# Patient Record
Sex: Male | Born: 1975 | Race: White | Hispanic: No | Marital: Married | Smoking: Never smoker
Health system: Southern US, Community
[De-identification: ages and names within clinical notes are randomized; demographics above are authoritative.]

## PROBLEM LIST (undated history)

## (undated) DIAGNOSIS — N200 Calculus of kidney: Secondary | ICD-10-CM

## (undated) DIAGNOSIS — E230 Hypopituitarism: Secondary | ICD-10-CM

## (undated) DIAGNOSIS — U071 COVID-19: Secondary | ICD-10-CM

## (undated) DIAGNOSIS — E213 Hyperparathyroidism, unspecified: Secondary | ICD-10-CM

## (undated) DIAGNOSIS — J1282 Pneumonia due to coronavirus disease 2019: Secondary | ICD-10-CM

## (undated) DIAGNOSIS — I3139 Other pericardial effusion (noninflammatory): Secondary | ICD-10-CM

## (undated) DIAGNOSIS — E871 Hypo-osmolality and hyponatremia: Secondary | ICD-10-CM

## (undated) DIAGNOSIS — E039 Hypothyroidism, unspecified: Secondary | ICD-10-CM

---

## 2001-09-12 ENCOUNTER — Encounter: Payer: Self-pay | Admitting: Emergency Medicine

## 2001-09-12 ENCOUNTER — Emergency Department (HOSPITAL_COMMUNITY): Admission: EM | Admit: 2001-09-12 | Discharge: 2001-09-12 | Payer: Self-pay | Admitting: Emergency Medicine

## 2004-05-18 ENCOUNTER — Emergency Department (HOSPITAL_COMMUNITY): Admission: EM | Admit: 2004-05-18 | Discharge: 2004-05-18 | Payer: Self-pay | Admitting: *Deleted

## 2004-06-04 ENCOUNTER — Emergency Department (HOSPITAL_COMMUNITY): Admission: EM | Admit: 2004-06-04 | Discharge: 2004-06-05 | Payer: Self-pay | Admitting: Emergency Medicine

## 2005-07-05 ENCOUNTER — Emergency Department (HOSPITAL_COMMUNITY): Admission: EM | Admit: 2005-07-05 | Discharge: 2005-07-06 | Payer: Self-pay | Admitting: Emergency Medicine

## 2005-11-08 ENCOUNTER — Emergency Department (HOSPITAL_COMMUNITY): Admission: EM | Admit: 2005-11-08 | Discharge: 2005-11-08 | Payer: Self-pay | Admitting: Emergency Medicine

## 2005-12-10 ENCOUNTER — Emergency Department (HOSPITAL_COMMUNITY): Admission: EM | Admit: 2005-12-10 | Discharge: 2005-12-11 | Payer: Self-pay | Admitting: Emergency Medicine

## 2007-06-20 ENCOUNTER — Emergency Department (HOSPITAL_COMMUNITY): Admission: EM | Admit: 2007-06-20 | Discharge: 2007-06-20 | Payer: Self-pay | Admitting: Emergency Medicine

## 2007-06-23 ENCOUNTER — Emergency Department (HOSPITAL_COMMUNITY): Admission: EM | Admit: 2007-06-23 | Discharge: 2007-06-24 | Payer: Self-pay | Admitting: Emergency Medicine

## 2007-12-30 ENCOUNTER — Emergency Department (HOSPITAL_COMMUNITY): Admission: EM | Admit: 2007-12-30 | Discharge: 2007-12-30 | Payer: Self-pay | Admitting: Emergency Medicine

## 2008-01-12 ENCOUNTER — Emergency Department (HOSPITAL_COMMUNITY): Admission: EM | Admit: 2008-01-12 | Discharge: 2008-01-13 | Payer: Self-pay | Admitting: Emergency Medicine

## 2009-10-04 IMAGING — CT CT PELVIS W/O CM
1 of 2 series · 13 of 32 positions shown, 19 images · non-contrast
Comparison: 06/20/2007

CT ABDOMEN

CLINICAL DATA: Right lower side pain.  Vomiting.  Flank pain.

CT OF THE ABDOMEN AND PELVIS WITHOUT CONTRAST (CT UROGRAM)
TECHNIQUE: Multidetector CT imaging was performed through the
abdomen and pelvis to include the urinary tract.

[Series 2: 130 stone 5.0 b40f st · axial · 0.75mm/px · z∈[-602,-188]mm · 13 of 97 slices shown, 19 images]
[im 7/97  soft-tissue]
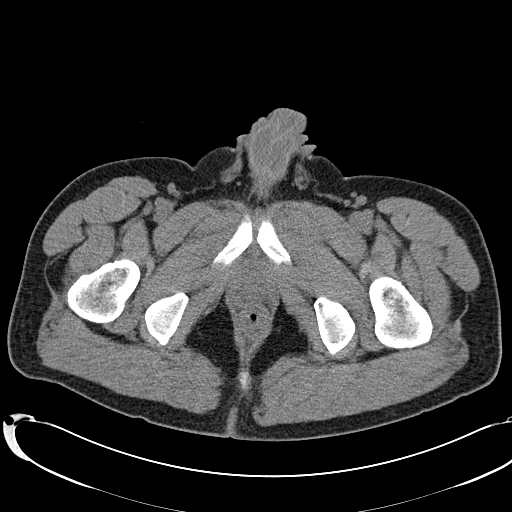
[im 7/97  bone]
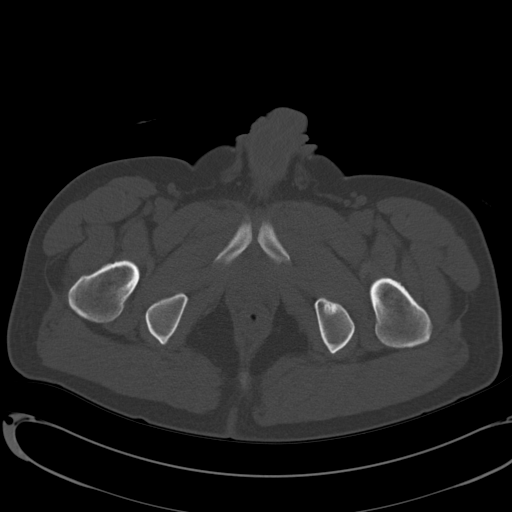
[im 13/97  soft-tissue]
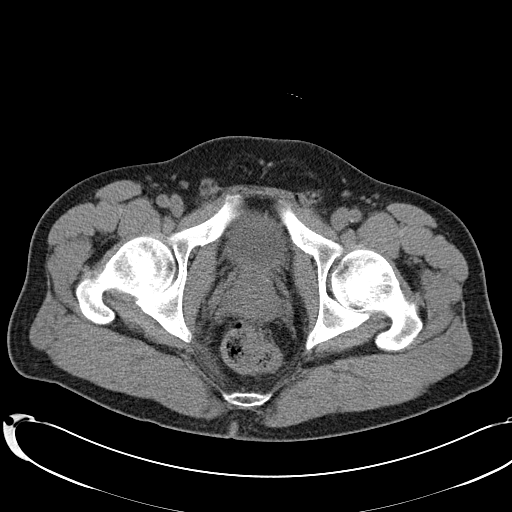
[im 20/97  soft-tissue]
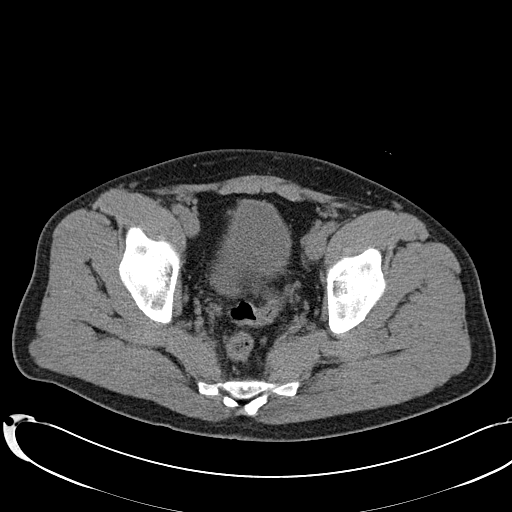
[im 26/97  soft-tissue]
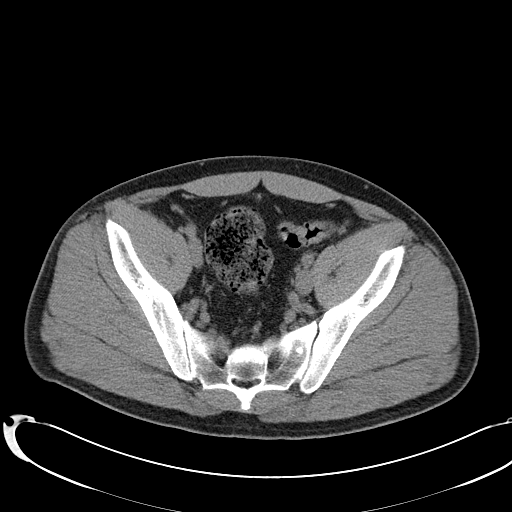
[im 33/97  soft-tissue]
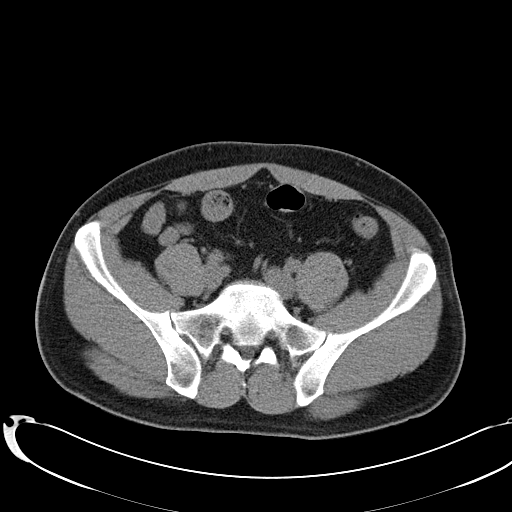
[im 39/97  soft-tissue]
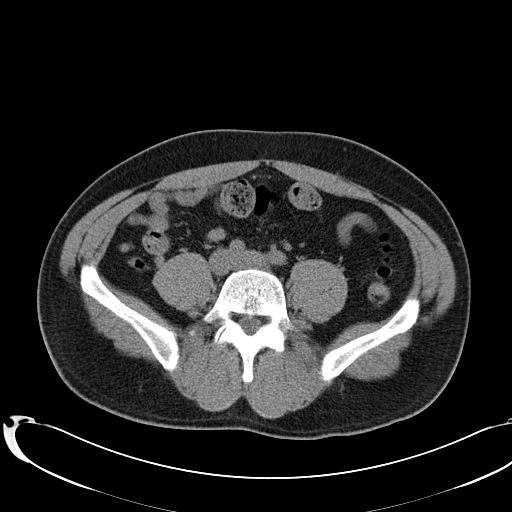
[im 52/97  soft-tissue]
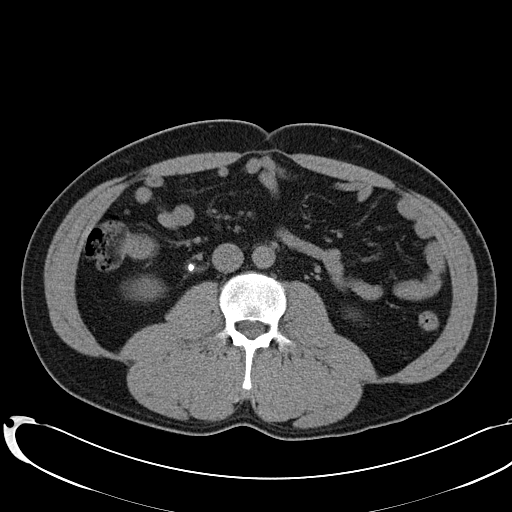
[im 58/97  soft-tissue]
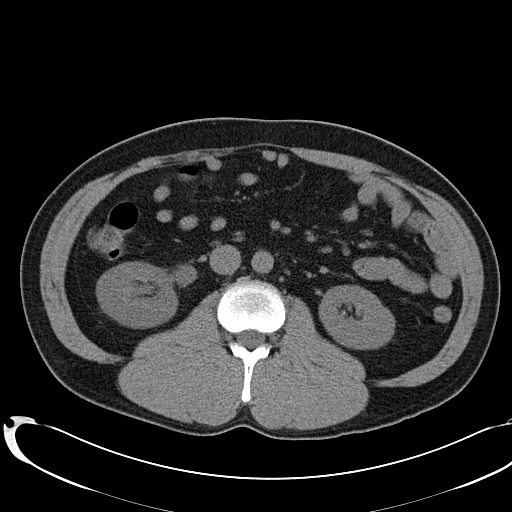
[im 65/97  soft-tissue]
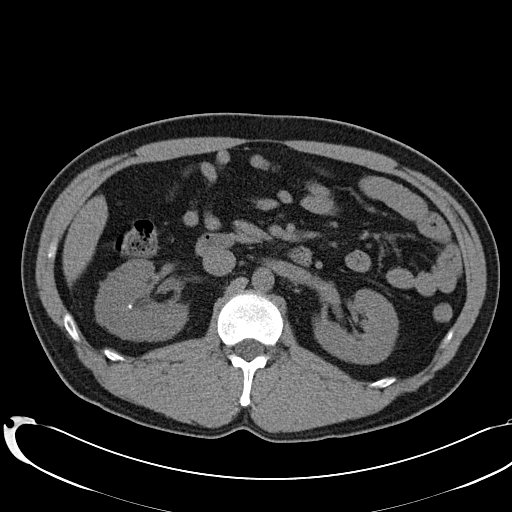
[im 65/97  bone]
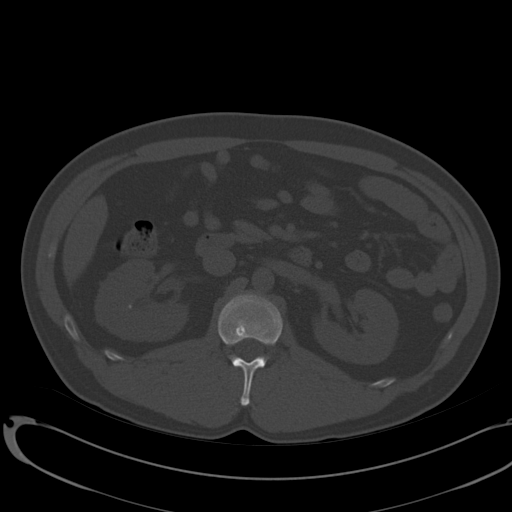
[im 71/97  soft-tissue]
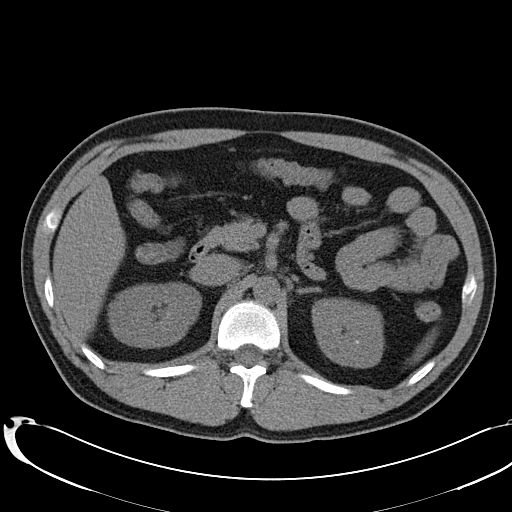
[im 71/97  lung]
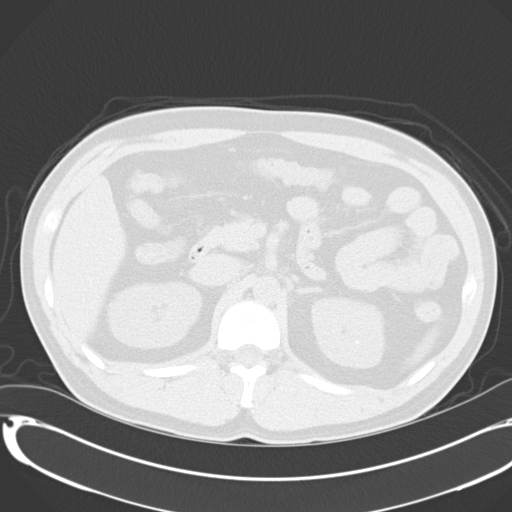
[im 77/97  soft-tissue]
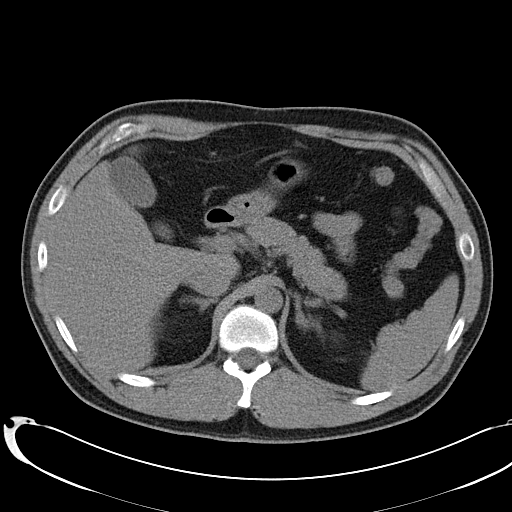
[im 77/97  lung]
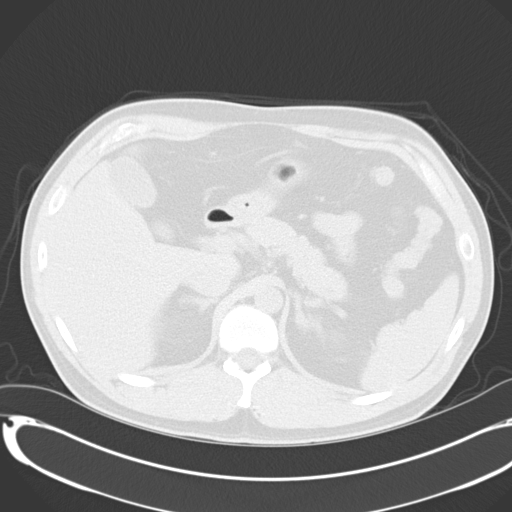
[im 84/97  soft-tissue]
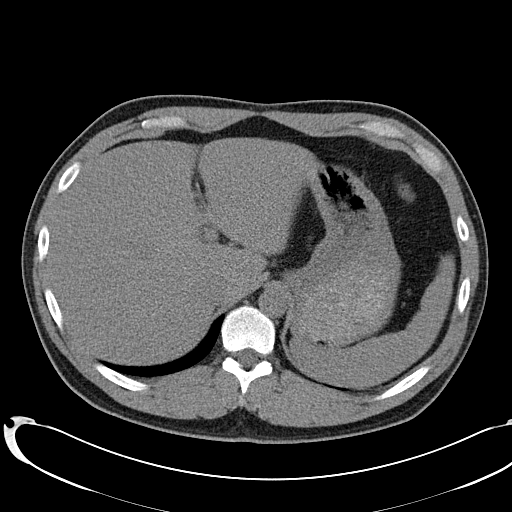
[im 84/97  lung]
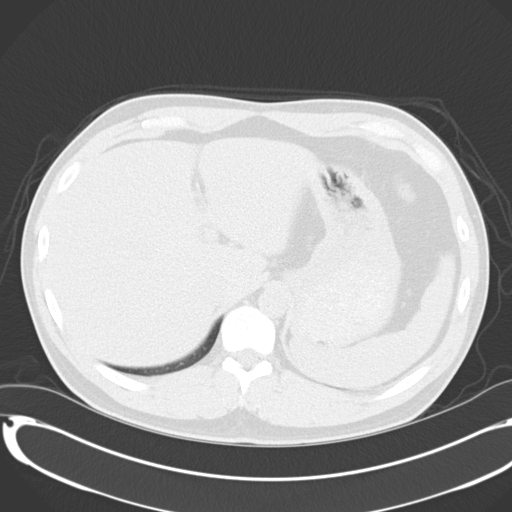
[im 90/97  soft-tissue]
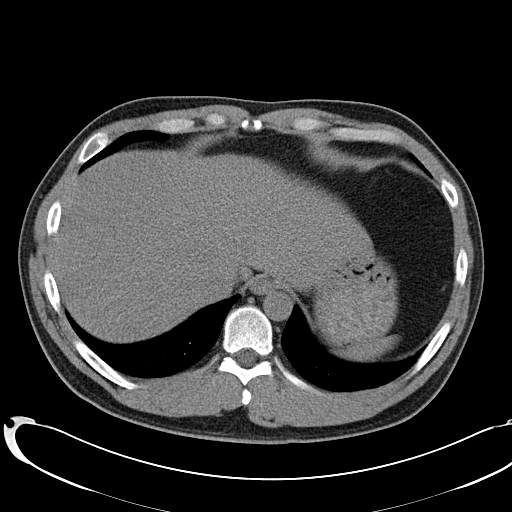
[im 90/97  lung]
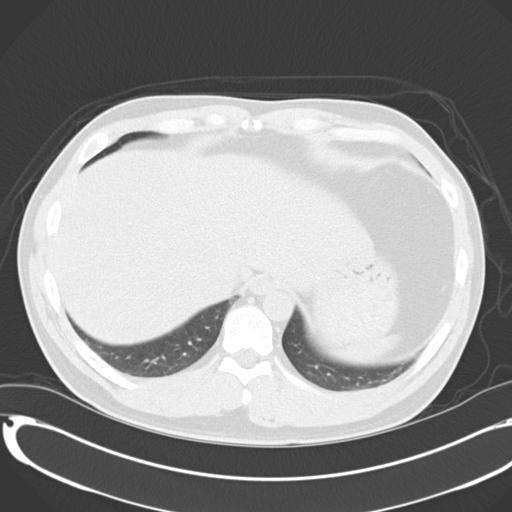

[13 of 32 positions shown; findings below may reference images not displayed]

FINDINGS: Dependent atelectasis in the lung bases.  Bilateral
nonobstructing renal collecting system calculi are present.  5 x 5
x 10 mm right proximal ureteral stone is present. Periureteric
inflammatory stranding is present.  The distal right ureter is
clear.  Left ureter appears within normal limits.  Mild - moderate
right hydronephrosis.  Stomach and proximal small bowel appears
within normal limits.  Adrenal glands unremarkable.  Residual
collecting systems stones bilaterally range in size from 2-4 mm.
Noncontrast appearance pancreas, gallbladder, spleen unremarkable.
IMPRESSION: 5 x 5 x 10 mm obstructing right proximal ureteral stone.  Bilateral
nonobstructing renal collecting system calculi.  Mild to moderate
right hydronephrosis.

CT PELVIS
FINDINGS: Urinary bladder unremarkable.  Majority of colon is
decompressed with mild fecal burden in the rectosigmoid.  No free
fluid.  Bones appear within normal limits.
IMPRESSION: No acute pelvic abnormality.

## 2010-07-09 ENCOUNTER — Emergency Department (HOSPITAL_COMMUNITY): Payer: Federal, State, Local not specified - PPO

## 2010-07-09 ENCOUNTER — Emergency Department (HOSPITAL_COMMUNITY)
Admission: EM | Admit: 2010-07-09 | Discharge: 2010-07-09 | Disposition: A | Discharge status: Home / Self Care | Payer: Federal, State, Local not specified - PPO | Attending: Emergency Medicine | Admitting: Emergency Medicine

## 2010-07-09 DIAGNOSIS — Z87442 Personal history of urinary calculi: Secondary | ICD-10-CM | POA: Insufficient documentation

## 2010-07-09 DIAGNOSIS — R51 Headache: Secondary | ICD-10-CM | POA: Insufficient documentation

## 2010-07-09 DIAGNOSIS — R110 Nausea: Secondary | ICD-10-CM | POA: Insufficient documentation

## 2010-07-10 ENCOUNTER — Emergency Department (HOSPITAL_COMMUNITY)
Admission: EM | Admit: 2010-07-10 | Discharge: 2010-07-10 | Disposition: A | Discharge status: Home / Self Care | Payer: Federal, State, Local not specified - PPO | Attending: Emergency Medicine | Admitting: Emergency Medicine

## 2010-07-10 DIAGNOSIS — H5710 Ocular pain, unspecified eye: Secondary | ICD-10-CM | POA: Insufficient documentation

## 2010-07-10 DIAGNOSIS — R51 Headache: Secondary | ICD-10-CM | POA: Insufficient documentation

## 2010-11-21 LAB — URINALYSIS, ROUTINE W REFLEX MICROSCOPIC
Bilirubin Urine: NEGATIVE
Glucose, UA: NEGATIVE
Leukocytes, UA: NEGATIVE
Nitrite: NEGATIVE
Specific Gravity, Urine: 1.027
Urobilinogen, UA: 0.2
pH: 5.5

## 2010-11-21 LAB — URINE MICROSCOPIC-ADD ON

## 2010-11-28 LAB — URINALYSIS, ROUTINE W REFLEX MICROSCOPIC
Bilirubin Urine: NEGATIVE
Bilirubin Urine: NEGATIVE
Ketones, ur: NEGATIVE
Nitrite: NEGATIVE
Protein, ur: NEGATIVE
Urobilinogen, UA: 0.2

## 2010-11-28 LAB — URINE MICROSCOPIC-ADD ON

## 2014-09-20 ENCOUNTER — Emergency Department (HOSPITAL_COMMUNITY)
Admission: EM | Admit: 2014-09-20 | Discharge: 2014-09-20 | Disposition: A | Discharge status: Home / Self Care | Payer: Federal, State, Local not specified - PPO | Attending: Emergency Medicine | Admitting: Emergency Medicine

## 2014-09-20 ENCOUNTER — Encounter (HOSPITAL_COMMUNITY): Payer: Self-pay | Admitting: *Deleted

## 2014-09-20 DIAGNOSIS — Z87442 Personal history of urinary calculi: Secondary | ICD-10-CM | POA: Insufficient documentation

## 2014-09-20 DIAGNOSIS — M545 Low back pain, unspecified: Secondary | ICD-10-CM

## 2014-09-20 MED ORDER — MELOXICAM 7.5 MG PO TABS: 15.0000 mg | ORAL_TABLET | Freq: Every day | ORAL | Status: DC

## 2014-09-20 MED ORDER — METHOCARBAMOL 500 MG PO TABS: 500.0000 mg | ORAL_TABLET | Freq: Two times a day (BID) | ORAL | Status: DC

## 2014-09-20 NOTE — Discharge Instructions (Signed)
Take Mobic and Robaxin as prescribed. Alternate ice and heat to areas of injury. Try to limit your heavy lifting. Follow up with a primary care doctor for a recheck of symptoms. Return to the ED if symptoms worsen or if you experience any of the symptoms listed below.  Back Pain, Adult Low back pain is very common. About 1 in 5 people have back pain.The cause of low back pain is rarely dangerous. The pain often gets better over time.About half of people with a sudden onset of back pain feel better in just 2 weeks. About 8 in 10 people feel better by 6 weeks.  CAUSES Some common causes of back pain include:  Strain of the muscles or ligaments supporting the spine.  Wear and tear (degeneration) of the spinal discs.  Arthritis.  Direct injury to the back. DIAGNOSIS Most of the time, the direct cause of low back pain is not known.However, back pain can be treated effectively even when the exact cause of the pain is unknown.Answering your caregiver's questions about your overall health and symptoms is one of the most accurate ways to make sure the cause of your pain is not dangerous. If your caregiver needs more information, he or she may order lab work or imaging tests (X-rays or MRIs).However, even if imaging tests show changes in your back, this usually does not require surgery. HOME CARE INSTRUCTIONS For many people, back pain returns.Since low back pain is rarely dangerous, it is often a condition that people can learn to [facility] their own.   Remain active. It is stressful on the back to sit or stand in one place. Do not sit, drive, or stand in one place for more than 30 minutes at a time. Take short walks on level surfaces as soon as pain allows.Try to increase the length of time you walk each day.  Do not stay in bed.Resting more than 1 or 2 days can delay your recovery.  Do not avoid exercise or work.Your body is made to move.It is not dangerous to be active, even though your  back may hurt.Your back will likely heal faster if you return to being active before your pain is gone.  Pay attention to your body when you bend and lift. Many people have less discomfortwhen lifting if they bend their knees, keep the load close to their bodies,and avoid twisting. Often, the most comfortable positions are those that put less stress on your recovering back.  Find a comfortable position to sleep. Use a firm mattress and lie on your side with your knees slightly bent. If you lie on your back, put a pillow under your knees.  Only take over-the-counter or prescription medicines as directed by your caregiver. Over-the-counter medicines to reduce pain and inflammation are often the most helpful.Your caregiver may prescribe muscle relaxant drugs.These medicines help dull your pain so you can more quickly return to your normal activities and healthy exercise.  Put ice on the injured area.  Put ice in a plastic bag.  Place a towel between your skin and the bag.  Leave the ice on for 15-20 minutes, 03-04 times a day for the first 2 to 3 days. After that, ice and heat may be alternated to reduce pain and spasms.  Ask your caregiver about trying back exercises and gentle massage. This may be of some benefit.  Avoid feeling anxious or stressed.Stress increases muscle tension and can worsen back pain.It is important to recognize when you are anxious or stressed  and learn ways to manage it.Exercise is a great option. SEEK MEDICAL CARE IF:  You have pain that is not relieved with rest or medicine.  You have pain that does not improve in 1 week.  You have new symptoms.  You are generally not feeling well. SEEK IMMEDIATE MEDICAL CARE IF:   You have pain that radiates from your back into your legs.  You develop new bowel or bladder control problems.  You have unusual weakness or numbness in your arms or legs.  You develop nausea or vomiting.  You develop abdominal  pain.  You feel faint. Document Released: 02/12/2005 Document Revised: 08/14/2011 Document Reviewed: 06/16/2013 [facility] Patient Information 2015 [place], [place]. This information is not intended to replace advice given to you by your health care provider. Make sure you discuss any questions you have with your health care provider.   Emergency Department Resource Guide 1) Find a Doctor and Pay Out of Pocket Although you won't have to find out who is covered by your insurance plan, it is a good idea to ask around and get recommendations. You will then need to call the office and see if the doctor you have chosen will accept you as a new patient and what types of options they offer for patients who are self-pay. Some doctors offer discounts or will set up payment plans for their patients who do not have insurance, but you will need to ask so you aren't surprised when you get to your appointment.  2) Contact Your Local Health Department Not all health departments have doctors that can see patients for sick visits, but many do, so it is worth a call to see if yours does. If you don't know where your local health department is, you can check in your phone book. The CDC also has a tool to help you locate your state's health department, and many state websites also have listings of all of their local health departments.  3) Find a Walk-in Clinic If your illness is not likely to be very severe or complicated, you may want to try a walk in clinic. These are popping up all over the country in pharmacies, drugstores, and shopping centers. They're usually staffed by nurse practitioners or physician assistants that have been trained to treat common illnesses and complaints. They're usually fairly quick and inexpensive. However, if you have serious medical issues or chronic medical problems, these are probably not your best option.  No Primary Care Doctor: - Call Health Connect at  [phone] - they can help you  locate a primary care doctor that  accepts your insurance, provides certain services, etc. - Physician Referral Service- [phone]  Chronic Pain Problems: Organization         Address  Phone   Notes  [facility]  [phone] Patients need to be referred by their primary care doctor.   Medication Assistance: Organization         Address  Phone   Notes  [facility] Medication [facility] [address] [place] [place], [place] [address] [phone] --Must be a resident of [facility] -- Must have NO insurance coverage whatsoever (no Medicaid/ Medicare, etc.) -- The pt. MUST have a primary care doctor that directs their care regularly and follows them in the community   [facility]ist  [phone]   [employer]  [phone]    Agencies that provide inexpensive medical care: Organization  Address  Phone   Notes  [place]  [phone]   [facility]    [phone]   [facility] [place], [place] [address] [phone]   [place] [address] [place] [place] [phone]   Planned Parenthood    [phone]   [facility]    [phone]   [place] and [address], [place] Phone:  [phone], Fax:  [phone] Hours of Operation:  9 am - 6 pm, M-F.  Also accepts Medicaid/Medicare and self-pay.  [facility] for [place] [place], [address], [place] Phone: [phone], Fax: [phone]. Hours of Operation:  8:30 am - 5:30 pm, M-F.  Also accepts Medicaid and self-pay.  [facility] [place] [address] [place] Phone: [phone]   [place], [place], [place] [phone], Ext. [phone] Mondays & Thursdays: 7-9 AM.  First 15 patients are seen on a first come, first serve basis.    [place]  Providers:  Organization         Address  Phone   Notes  [facility] [address], [place] [phone] Also accepts self-pay patients.  [facility] [address], [place]  [phone]   [place], [address], [place] [phone]   [facility] [address], [place] [phone]   Lucianne Lei [address], [place]   [phone] Only accepts [place] Access [place] patients after they have their name applied to their card.   Self-Pay (no insurance) in [facility]:  Organization         Address  Phone   Notes  Sickle Cell Patients, [facility] [place] [phone]   [facility] [place] [phone]   [facility] [place]  [place], [place], [place] [phone]   [facility]/Dr. Osei-Bonsu  [address] [place] or [address], [place] [phone] Phone number for both [place] and [place] locations is the same.  [facility] [address], [place] [phone]   [facility] [address], [place] or [address] [phone]   [facility] [address] [place] [phone], phone; [phone], fax Sees patients 1st and 3rd Saturday of every month.  Must not qualify for public or private insurance (i.e. Medicaid, Medicare, [place] Health Choice, Veterans' Benefits)  Household income should be no more than 200% of the poverty level The clinic cannot treat you if you are pregnant or think you are pregnant  Sexually transmitted diseases are not treated at the clinic.    Dental Care: Organization         Address  Phone  Notes  [facility] Department of [facility] [place] [phone] Accepts children up to age 77 who are enrolled in [place] or [place]; pregnant women with a Medicaid card; and children who have applied for Medicaid or [place] Health Choice, but were declined, whose parents can pay a reduced fee at time  of service.  [facility] Department of [facility]  [address], [place] [phone] Accepts children up to age 8 who are enrolled in [place] or [place]; pregnant women with a Medicaid card; and children who have applied for Medicaid or  Health Choice, but were declined, whose parents can pay a reduced fee at time of service.  [place] Adult Dental Access PROGRAM  [place] [phone] Patients are seen by appointment only. Walk-ins are not accepted. [place] will see patients 36 years of age and older. Monday - Tuesday (8am-5pm) Most Wednesdays (8:30-5pm) $30 per visit, cash only  [facility] Adult Dental Access PROGRAM  [address], [facility] [phone] Patients are seen by appointment only. Walk-ins are not accepted. [place] will see patients 63 years of age and older. One Wednesday Evening (Monthly: Volunteer Based).  $30 per visit, cash only  [place]  [phone] for adults; Children under age 61, call Graduate Pediatric Dentistry at [phone]. Children aged 4-14, please call [phone] to request a pediatric application.  Dental services are provided in all areas of dental care including fillings, crowns and bridges, complete and partial dentures, implants, gum treatment, root canals, and extractions. Preventive care is also provided. Treatment is provided to both adults and children. Patients are selected via a lottery and there is often a waiting list.   [facility] [address] [place]  [phone] [email or web address]   [facility]  [address] [place], [place] [phone], Ext. [phone] Second and Fourth Thursday of each month, opens at 6:30 AM; Clinic ends at 9 AM.  Patients are seen on a first-come first-served basis, and a limited number are seen during each clinic.   [facility]  [address] Hillard Danker [place], [place] [phone]   Eligibility Requirements You must have lived in [place], [place], or [place] counties for at least the last three months.   You cannot be eligible for state or federal sponsored [employer], including [employer], [place], or [employer].   You generally cannot be eligible for healthcare insurance through your employer.    How to apply: Eligibility screenings are held every Tuesday and Wednesday afternoon from 1:00 pm until 4:00 pm. You do not need an appointment for the interview!  [facility] [address] [place], [place]   [place]  [place] Department  [place]  [phone]    Behavioral Health Resources in the Community: Intensive Outpatient Programs Organization         Address  Phone  Notes  [place]. [address], [place], [place] [phone]   [facility] [address] [place], [place]   ADS: Alcohol & Drug S[facility]cs [address] [place], [place]   [place] [address]  [place], [place] or [phone]   Substance Abuse Resources Organization         Address  Phone  Notes  Alcohol and Drug Services  [phone]   [place]  [phone]   [facility] [place]  [phone]   Chinita Pester  [phone]   Residential & Outpatient Substance Abuse Program  [phone]   Psychological Services Organization         Address  Phone  Notes  [facility]  [place]   [place] [address] [place] or [phone]    Mobile Crisis Teams Organization         Address  Phone  Notes  [facility], Mobile Crisis Care Unit  [phone]   [facility]  [address] [place], [place]   [place] [address] [place] [phone]    Self-Help/Support Groups Organization         Address  Phone             Notes  [place] of [place] - variety of support groups  [place] Call for more information  Narcotics Anonymous (NA), [facility] [address], [employer] [place]  2 meetings at this location   Special educational needs teacher         Address  Phone  Notes  [facility]idential [facility] [place],    [place]  [phone]   [facility]  [address] [place] [id number], [place], [place]   [place], [place] [phone] Admissions: 8am-3pm M-F  Incentives Substance [place] [address]    [place], [place] [phone]   [facility] [address] [place], [place], [place]   [facility] [address]  [place], [place]   [facility]ight Programs - Intensive Outpatient [address], [place], [place]   [facility] ([place].,  [place], [place] [phone] or [phone]   [facility] Services (RTS) [address] [place], [place] Accepts Medicaid  [facility] [place] [address],  [place] [phone] Substance Abuse/Addiction Treatment   [facility] Organization         Address  Phone  Notes  [facility]  [phone]   Domenic Schwab, PhD [address] [place]lis [place], [place]   [phone] or [phone]    [place] Braggs [place], [place] [phone]   [facility] [address], [place], [place] [phone] Insurance/Medicaid/sponsorship through [facility] and Families [address]                                    [place], [place] [phone] [place] [address], [place] [phone]    Dr. Adele Schilder  [phone]   [facility] Dept. 1) [address] [place] 2) [place] 3)  [place] [address], [place] [phone]  [phone]   [place] [phone] or [phone] (After Hours)

## 2014-09-20 NOTE — ED Provider Notes (Signed)
CSN: [id number]     Arrival date & time 09/20/14  0840 History   First MD Initiated Contact with Patient 09/20/14 [phone]     Chief Complaint  Patient presents with  . Back Pain     (Consider location/radiation/quality/duration/timing/severity/associated sxs/prior Treatment) HPI Comments: 39 year old male with no significant past medical history presents to the emergency department for further evaluation of right-sided low back pain times one week. Patient states that he does fairly consistent lifting at his job. 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 Patient has most aggravation with his pain when supine. He describes the pain as sharp without any radiation. Pain is intermittent and mildly improved with ibuprofen. He denies a history of similar symptoms. He does report a history of kidney stones, but states that this pain does not feel similar. He has had no fever, chest pain, shortness of breath, abdominal pain, nausea, vomiting, bowel/bladder incontinence, or extremity numbness/weakness. He denies any difficulty ambulating. No history of trauma or injury to the back. No recent travel, surgeries, or hospitalizations. No leg swelling. No hx of CA or IVDU.  Patient is a 39 y.o. male presenting with back pain. The history is provided by the patient. No language interpreter was used.  Back Pain   History reviewed. No pertinent past medical history. No past surgical history on file. History reviewed. No pertinent family history. History  Substance Use Topics  . Smoking status: Not on file  . Smokeless tobacco: Not on file  . Alcohol Use: Not on file    Review of Systems  Musculoskeletal: Positive for back pain.  All other systems reviewed and are negative.   Allergies  Review of patient's allergies indicates no known allergies.  Home Medications   Prior to Admission medications   Medication Sig Start Date End Date Taking? Authorizing Provider  meloxicam (MOBIC) 7.5 MG tablet  Take 2 tablets (15 mg total) by mouth daily. 09/20/14   Antony Madura, PA-C  methocarbamol (ROBAXIN) 500 MG tablet Take 1 tablet (500 mg total) by mouth 2 (two) times daily. 09/20/14   Antony Madura, PA-C   BP 147/93 mmHg  Pulse 57  Temp(Src) 97.8 F (36.6 C) (Oral)  Resp 18  Ht  (1.778 m)  Wt 180 lb (81.647 kg)  BMI 25.83 kg/m2  SpO2 100%   Physical Exam  Constitutional: He is oriented to person, place, and time. He appears well-developed and well-nourished. No distress.  Nontoxic/nonseptic appearing  HENT:  Head: Normocephalic and atraumatic.  Eyes: Conjunctivae and EOM are normal. No scleral icterus.  Neck: Normal range of motion.  Cardiovascular: Normal rate, regular rhythm and intact distal pulses.   DP and PT pulses 2+ bilaterally  Pulmonary/Chest: Effort normal and breath sounds normal. No respiratory distress. He has no wheezes. He has no rales.  Respirations even and unlabored. Lungs clear.  Musculoskeletal: Normal range of motion.       Lumbar back: He exhibits tenderness. He exhibits normal range of motion, no bony tenderness, no swelling, no deformity and normal pulse.       Back:  Mild reproducible tenderness to the right lumbar paraspinal muscles. No tenderness to palpation to the thoracic or lumbar midline. No bony deformities, step-offs, or crepitus. Negative straight leg raise and crossed straight leg raise.  Neurological: He is alert and oriented to person, place, and time. He exhibits normal muscle tone. Coordination normal.  Sensation to light touch intact in bilateral lower extremities. Patient ambulatory with steady gait.  Skin: Skin  is warm and dry. No rash noted. He is not diaphoretic. No erythema. No pallor.  Psychiatric: He has a normal mood and affect. His behavior is normal.  Nursing note and vitals reviewed.   ED Course  Procedures (including critical care time) Labs Review Labs Reviewed - No data to display  Imaging Review No results found.    EKG Interpretation None      MDM   Final diagnoses:  Right-sided low back pain without sciatica    39 y/o male presents to the emergency department for further evaluation of low back pain times one week. Patient is neurovascularly intact. He is ambulatory in the ED without difficulty. No red flags or signs concerning for cauda equina. No tenderness to the thoracic or lumbar midline. Patient denies any trauma or injury inciting his symptoms. No history of cancer or IV drug use. Doubt atypically presenting PE given lack of respiratory symptoms. Patient is PERC negative. Symptoms likely MSK in etiology. We will treat with ice/heat, NSAIDs, and Robaxin. Primary care follow up advised and return precautions given. Patient agreeable to plan with no unaddressed concerns.   Filed Vitals:   09/20/14 0850  BP: 147/93  Pulse: 57  Temp: 97.8 F (36.6 C)  TempSrc: Oral  Resp: 18  Height: 5\' 10"  (1.778 m)  Weight: 180 lb (81.647 kg)  SpO2: 100%     Antony Madura, PA-C 09/20/14 0930  Azalia Bilis, MD 09/20/14 [phone]

## 2014-09-20 NOTE — ED Notes (Signed)
Mild lower back pain x 1 week. No history of injury or similar pain. Has taken ibuprofen with no relief. Pain worse lying down.  Hx of kidney stones, this does not feel like that.

## 2014-09-21 ENCOUNTER — Encounter (HOSPITAL_COMMUNITY): Payer: Self-pay

## 2014-09-21 ENCOUNTER — Emergency Department (HOSPITAL_COMMUNITY)
Admission: EM | Admit: 2014-09-21 | Discharge: 2014-09-21 | Disposition: A | Discharge status: Home / Self Care | Payer: Federal, State, Local not specified - PPO | Attending: Emergency Medicine | Admitting: Emergency Medicine

## 2014-09-21 DIAGNOSIS — Z87442 Personal history of urinary calculi: Secondary | ICD-10-CM | POA: Diagnosis not present

## 2014-09-21 DIAGNOSIS — K219 Gastro-esophageal reflux disease without esophagitis: Secondary | ICD-10-CM | POA: Insufficient documentation

## 2014-09-21 DIAGNOSIS — R1010 Upper abdominal pain, unspecified: Secondary | ICD-10-CM | POA: Diagnosis present

## 2014-09-21 HISTORY — DX: Calculus of kidney: N20.0

## 2014-09-21 LAB — CBC
HEMATOCRIT: 42.7 % (ref 39.0–52.0)
Hemoglobin: 14.2 g/dL (ref 13.0–17.0)
MCH: 32 pg (ref 26.0–34.0)
MCHC: 33.3 g/dL (ref 30.0–36.0)
MCV: 96.2 fL (ref 78.0–100.0)
Platelets: 239 10*3/uL (ref 150–400)
RBC: 4.44 MIL/uL (ref 4.22–5.81)
RDW: 12.6 % (ref 11.5–15.5)
WBC: 4.6 10*3/uL (ref 4.0–10.5)

## 2014-09-21 LAB — URINALYSIS, ROUTINE W REFLEX MICROSCOPIC
BILIRUBIN URINE: NEGATIVE
GLUCOSE, UA: NEGATIVE mg/dL
HGB URINE DIPSTICK: NEGATIVE
Ketones, ur: NEGATIVE mg/dL
Leukocytes, UA: NEGATIVE
NITRITE: NEGATIVE
Protein, ur: NEGATIVE mg/dL
Specific Gravity, Urine: 1.018 (ref 1.005–1.030)
Urobilinogen, UA: 0.2 mg/dL (ref 0.0–1.0)
pH: 5.5 (ref 5.0–8.0)

## 2014-09-21 LAB — COMPREHENSIVE METABOLIC PANEL
ALT: 28 U/L (ref 17–63)
ANION GAP: 7 (ref 5–15)
AST: 42 U/L — ABNORMAL HIGH (ref 15–41)
Albumin: 4.8 g/dL (ref 3.5–5.0)
Alkaline Phosphatase: 36 U/L — ABNORMAL LOW (ref 38–126)
BUN: 11 mg/dL (ref 6–20)
CHLORIDE: 107 mmol/L (ref 101–111)
CO2: 24 mmol/L (ref 22–32)
CREATININE: 1.22 mg/dL (ref 0.61–1.24)
Calcium: 10.8 mg/dL — ABNORMAL HIGH (ref 8.9–10.3)
GFR calc Af Amer: >60 mL/min (ref >60)
GFR calc non Af Amer: >60 mL/min (ref >60)
GLUCOSE: 81 mg/dL (ref 65–99)
POTASSIUM: 4.2 mmol/L (ref 3.5–5.1)
Sodium: 138 mmol/L (ref 135–145)
TOTAL PROTEIN: 8.1 g/dL (ref 6.5–8.1)
Total Bilirubin: 0.4 mg/dL (ref 0.3–1.2)

## 2014-09-21 LAB — LIPASE, BLOOD: Lipase: 20 U/L — ABNORMAL LOW (ref 22–51)

## 2014-09-21 LAB — TROPONIN I: Troponin I: <0.03 ng/mL (ref <0.031)

## 2014-09-21 MED ORDER — GI COCKTAIL ~~LOC~~
30.0000 mL | Freq: Once | ORAL | Status: AC
  Administered 2014-09-21: 30 mL via ORAL
  Filled 2014-09-21: qty 30

## 2014-09-21 MED ORDER — OMEPRAZOLE 20 MG PO CPDR: 20.0000 mg | DELAYED_RELEASE_CAPSULE | Freq: Every day | ORAL | Status: DC

## 2014-09-21 NOTE — Discharge Instructions (Signed)
Gastroesophageal Reflux Disease, Adult °Gastroesophageal reflux disease (GERD) happens when acid from your stomach flows up into the esophagus. When acid comes in contact with the esophagus, the acid causes soreness (inflammation) in the esophagus. Over time, GERD may create small holes (ulcers) in the lining of the esophagus. °CAUSES  °· Increased body weight. This puts pressure on the stomach, making acid rise from the stomach into the esophagus. °· Smoking. This increases acid production in the stomach. °· Drinking alcohol. This causes decreased pressure in the lower esophageal sphincter (valve or ring of muscle between the esophagus and stomach), allowing acid from the stomach into the esophagus. °· Late evening meals and a full stomach. This increases pressure and acid production in the stomach. °· A malformed lower esophageal sphincter. °Sometimes, no cause is found. °SYMPTOMS  °· Burning pain in the lower part of the mid-chest behind the breastbone and in the mid-stomach area. This may occur twice a week or more often. °· Trouble swallowing. °· Sore throat. °· Dry cough. °· Asthma-like symptoms including chest tightness, shortness of breath, or wheezing. °DIAGNOSIS  °Your caregiver may be able to diagnose GERD based on your symptoms. In some cases, X-rays and other tests may be done to check for complications or to check the condition of your stomach and esophagus. °TREATMENT  °Your caregiver may recommend over-the-counter or prescription medicines to help decrease acid production. Ask your caregiver before starting or adding any new medicines.  °HOME CARE INSTRUCTIONS  °· Change the factors that you can control. Ask your caregiver for guidance concerning weight loss, quitting smoking, and alcohol consumption. °· Avoid foods and drinks that make your symptoms worse, such as: °¨ Caffeine or alcoholic drinks. °¨ Chocolate. °¨ Peppermint or mint flavorings. °¨ Garlic and onions. °¨ Spicy foods. °¨ Citrus fruits,  such as oranges, lemons, or limes. °¨ Tomato-based foods such as sauce, chili, salsa, and pizza. °¨ Fried and fatty foods. °· Avoid lying down for the 3 hours prior to your bedtime or prior to taking a nap. °· Eat small, frequent meals instead of large meals. °· Wear loose-fitting clothing. Do not wear anything tight around your waist that causes pressure on your stomach. °· Raise the head of your bed 6 to 8 inches with wood blocks to help you sleep. Extra pillows will not help. °· Only take over-the-counter or prescription medicines for pain, discomfort, or fever as directed by your caregiver. °· Do not take aspirin, ibuprofen, or other nonsteroidal anti-inflammatory drugs (NSAIDs). °SEEK IMMEDIATE MEDICAL CARE IF:  °· You have pain in your arms, neck, jaw, teeth, or back. °· Your pain increases or changes in intensity or duration. °· You develop nausea, vomiting, or sweating (diaphoresis). °· You develop shortness of breath, or you faint. °· Your vomit is green, yellow, black, or looks like coffee grounds or blood. °· Your stool is red, bloody, or black. °These symptoms could be signs of other problems, such as heart disease, gastric bleeding, or esophageal bleeding. °MAKE SURE YOU:  °· Understand these instructions. °· Will watch your condition. °· Will get help right away if you are not doing well or get worse. °Document Released: 11/22/2004 Document Revised: 05/07/2011 Document Reviewed: 09/01/2010 °ExitCare® Patient Information ©2015 [employer]. This information is not intended to replace advice given to you by your health care provider. Make sure you discuss any questions you have with your health care provider. ° °Food Choices for Gastroesophageal Reflux Disease °When you have gastroesophageal reflux disease (GERD), the foods   you eat and your eating habits are very important. Choosing the right foods can help ease the discomfort of GERD. °WHAT GENERAL GUIDELINES DO I NEED TO FOLLOW? °· Choose fruits,  vegetables, whole grains, low-fat dairy products, and low-fat meat, fish, and poultry. °· Limit fats such as oils, salad dressings, butter, nuts, and avocado. °· Keep a food diary to identify foods that cause symptoms. °· Avoid foods that cause reflux. These may be different for different people. °· Eat frequent small meals instead of three large meals each day. °· Eat your meals slowly, in a relaxed setting. °· Limit fried foods. °· Cook foods using methods other than frying. °· Avoid drinking alcohol. °· Avoid drinking large amounts of liquids with your meals. °· Avoid bending over or lying down until 2-3 hours after eating. °WHAT FOODS ARE NOT RECOMMENDED? °The following are some foods and drinks that may worsen your symptoms: °Vegetables °Tomatoes. Tomato juice. Tomato and spaghetti sauce. Chili peppers. Onion and garlic. Horseradish. °Fruits °Oranges, grapefruit, and lemon (fruit and juice). °Meats °High-fat meats, fish, and poultry. This includes hot dogs, ribs, ham, sausage, salami, and bacon. °Dairy °Whole milk and chocolate milk. Sour cream. Cream. Butter. Ice cream. Cream cheese.  °Beverages °Coffee and tea, with or without caffeine. Carbonated beverages or energy drinks. °Condiments °Hot sauce. Barbecue sauce.  °Sweets/Desserts °Chocolate and cocoa. Donuts. Peppermint and spearmint. °Fats and Oils °High-fat foods, including French fries and potato chips. °Other °Vinegar. Strong spices, such as black pepper, white pepper, red pepper, cayenne, curry powder, cloves, ginger, and chili powder. °The items listed above may not be a complete list of foods and beverages to avoid. Contact your dietitian for more information. °Document Released: 02/12/2005 Document Revised: 02/17/2013 Document Reviewed: 12/17/2012 °ExitCare® Patient Information ©2015 [employer]. This information is not intended to replace advice given to you by your health care provider. Make sure you discuss any questions you have with your  health care provider. ° ° °Emergency Department Resource Guide °1) Find a Doctor and Pay Out of Pocket °Although you won't have to find out who is covered by your insurance plan, it is a good idea to ask around and get recommendations. You will then need to call the office and see if the doctor you have chosen will accept you as a new patient and what types of options they offer for patients who are self-pay. Some doctors offer discounts or will set up payment plans for their patients who do not have insurance, but you will need to ask so you aren't surprised when you get to your appointment. ° °2) Contact Your Local Health Department °Not all health departments have doctors that can see patients for sick visits, but many do, so it is worth a call to see if yours does. If you don't know where your local health department is, you can check in your phone book. The CDC also has a tool to help you locate your state's health department, and many state websites also have listings of all of their local health departments. ° °3) Find a Walk-in Clinic °If your illness is not likely to be very severe or complicated, you may want to try a walk in clinic. These are popping up all over the country in pharmacies, drugstores, and shopping centers. They're usually staffed by nurse practitioners or physician assistants that have been trained to treat common illnesses and complaints. They're usually fairly quick and inexpensive. However, if you have serious medical issues or chronic medical problems, these   are probably not your best option. ° °No Primary Care Doctor: °- Call [facility] at  832-8000 - they can help you locate a primary care doctor that  accepts your insurance, provides certain services, etc. °- Physician Referral Service- 1-800-533-3463 ° °Chronic Pain Problems: °Organization         Address  Phone   Notes  °[facility]  (336) 297-2271 Patients need to be referred by their primary care doctor.   ° °Medication Assistance: °Organization         Address  Phone   Notes  °[place] [facility] [address] °[place], [place] [address] (336) 641-8030 --Must be a resident of [place] °-- Must have NO insurance coverage whatsoever (no Medicaid/ Medicare, etc.) °-- The pt. MUST have a primary care doctor that directs their care regularly and follows them in the community °  °[facility]  (866) 331-1348   °[employer]  (888) 892-1162   ° °Agencies that provide inexpensive medical care: °Organization         Address  Phone   Notes  °[facility]  (336) 832-8035   °[facility]    (336) 832-7272   °[facility] [address] °[place], [place] [address] (336) 832-4777   °[facility] [address], °[place] (336) 271-4999   °[facility]    (336) 373-0678   °[facility]    (336) 272-1050   °[facility] ° [address], [place] Phone:  (336) 832-4444, Fax:  (336) 832-4440 Hours of Operation:  9 am - 6 pm, M-F.  Also accepts Medicaid/Medicare and self-pay.  °[facility] ° [address], [place] Phone: (336) 832-3150, Fax: (336) [phone]. Hours of Operation:  8:30 am - 5:30 pm, M-F.  Also accepts Medicaid and self-pay.  °[facility] [place] [address], [place] Phone: (336) 878-6027   °[facility] [address], [place], [place] (336)723-1848, Ext. 123 Mondays & Thursdays: 7-9 AM.  First 15 patients are seen on a first come, first serve basis. °  ° °Medicaid-accepting [place] Providers: ° °Organization         Address  Phone   Notes  °[facility] [address], [place] (336) 641-2100 Also accepts self-pay patients.  °[facility] [address], [place] ° (336) 856-9996   °[facility] [address], [place] (336) 288-8857   °[facility] [address] [place] [address], [place] (336) 299-7000   °Veita Bland [address], [place]  ° (336) 373-1557 Only accepts [place] Access Medicaid patients after they have their name applied to their card.  ° °Self-Pay (no insurance) in [place]: ° °Organization         Address  Phone   Notes  °Sickle Cell Patients, [facility] [address], [place] (336) 832-1970   °[facility] [address], [place] (336) 832-4400   °[facility] ° [address], [place] (336) 992-4800   °[facility]/Dr. Osei-Bonsu ° [address] [place] [address], [place] or [address], [place] (336) [phone] Phone number for both [place] and [place] locations is the same.  °[facility] [address], [place] (336) 299-0000   °[facility] [address] [place] [address], [place] or [address] (336) 852-7530 °(336) 878-2260   °[facility] [address], [place] (336) 350-1642, phone; (336) 294-5005, fax Sees patients 1st and 3rd Saturday of every month.  Must not qualify for public or private insurance (i.e. Medicaid, Medicare, [place] Health Choice, Veterans' Benefits) • Household income should be no more than 200% of the poverty level •The clinic cannot treat you if you are pregnant or think you are pregnant • Sexually transmitted diseases are not treated at the clinic.  ° ° °Dental Care: °Organization         Address  Phone  Notes  °[facility] [facility] [address], [place] (336) 641-6152 Accepts children up to age 21 who are enrolled in Medicaid or [place] Health Choice; pregnant women with a Medicaid card; and children who have applied for Medicaid or [place] Health Choice, but were declined, whose parents can pay a reduced fee at time of service.    °[facility] [place]  [address], [place] (336) 641-7733 Accepts children up to age 21 who are enrolled in Medicaid or [place] Health Choice; pregnant women with a Medicaid card; and children who have applied for Medicaid or [place] Health Choice, but were declined, whose parents can pay a reduced fee at time of service.  °[facility] ° [address], [place] (336) 641-4533 Patients are seen by appointment only. Walk-ins are not accepted. [facility] will see patients 18 years of age and older. °Monday - Tuesday (8am-5pm) °Most Wednesdays (8:30-5pm) °$30 per visit, cash only  °[facility] ° [address], [place] (336) 641-4533 Patients are seen by appointment only. Walk-ins are not accepted. [facility] will see patients 18 years of age and older. °One Wednesday Evening (Monthly: Volunteer Based).  $30 per visit, cash only  °[facility]  (919) 537-3737 for adults; Children under age 4, call Graduate Pediatric Dentistry at (919) [phone]. Children aged 4-14, please call (919) [phone] to request a pediatric application. ° Dental services are provided in all areas of dental care including fillings, crowns and bridges, complete and partial dentures, implants, gum treatment, root canals, and extractions. Preventive care is also provided. Treatment is provided to both adults and children. °Patients are selected via a lottery and there is often a waiting list. °  °[facility] [address], °[place] ° (336) 763-8833 [email or web address] °  °[facility] [address], [place], [place] (336)723-1848, Ext. 123 Second and Fourth Thursday of each month, opens at 6:30 AM; Clinic ends at 9 AM.  Patients are seen on a first-come first-served basis, and a limited number are seen during each clinic.  ° °[facility] ° [address], [place], [place] (336)  723-7904   Eligibility Requirements °You must have lived in [place], [place], or [place] counties for at least the last three months. °  You cannot be eligible for state or federal sponsored healthcare insurance, including Veterans Administration, Medicaid, or Medicare. °  You generally cannot be eligible for healthcare insurance through your employer.  °  How to apply: °Eligibility screenings are held every Tuesday and Wednesday afternoon from 1:00 pm until 4:00 pm. You do not need an appointment for the interview!  °[facility] [address], [place], [place] 336-631-2330   °[facility]  336-342-8273   °[facility]  336-703-3100   °[facility]  336-570-6415   ° °Behavioral Health Resources in the Community: °Intensive Outpatient Programs °Organization         Address  Phone  Notes  °[facility] [address], [place], [place] 336-878-6098   °[facility] [address], [place], [place] 336-832-9800   °[facility] [address], [place], [place] ° 336-882-2125   °[facility] [address],  °[place], [place] 1-800-853-5163 or 336-641-4981   °Substance Abuse Resources °Organization         Address  Phone  Notes  °[facility]  336-882-2125   °[facility]  336-784-9470   °[facility]  336-285-9073   °[facility]  336-845-3988   °Residential & Outpatient Substance Abuse Program  1-800-659-3381   °Psychological Services °Organization         Address  Phone  Notes  °[facility]  336- 832-9600   °[facility]  336- 378-7881   °[facility] [address], [place] 1-800-853-5163 or 336-641-4981   ° °Mobile Crisis Teams °Organization         Address  Phone  Notes  °[facility], Mobile Crisis Care Unit  1-877-626-1772   °Assertive °[facility] ° [address] [place], [place] 336-834-9664   °Sharon DeEsch [address] °[place] 336-554-5454   ° °Self-Help/Support Groups °Organization         Address  Phone             Notes  °[facility] - variety of support groups  336- 373-1402 Call for more information  °Narcotics Anonymous (NA), [facility] [address], °[place]  2 meetings at this location  ° °Residential Treatment Programs °Organization         Address  Phone  Notes  °[facility] [address],    °[place]  1-866-801-8205   °[facility] ° [address], [place], [place] 704-293-8524   °[facility] [address], [place] 336-845-3988 Admissions: 8am-3pm M-F  °[facility] [address]    °[place], [place] 336-841-1104   °[facility] [address], [place], [place] 336-379-7146   °[facility] [address]  °[place], [place] 336-285-9073   °[facility] Intensive Outpatient [address], [place], [place] 336-852-3033   °[facility] [address]  °[place], [place] 1-877-615-2722 or 336-784-9470   °[facility] [address] [place], [place] 336-227-7417 Accepts Medicaid  °[facility] [address]  ° [place] 1-800-659-3381 Substance Abuse/Addiction Treatment  ° °[place] Behavioral Health Resources °Organization         Address  Phone  Notes  °[facility]  (888) 581-9988   °Julie Brannon, PhD [address] [place], [place]   (336) 349-5553 or (336) 951-0000   °[facility]   [address] °[place], [place] (336) 349-4454   °[facility] [address], [place],  (336) 342-8316 Insurance/Medicaid/sponsorship through [facility]  °[facility] [address]                                      [place], [place] (336) 342-8316  Therapy/tele-psych/case  °[facility] [address]  ° [place], [place] (336) 349-2233    °Dr. Arfeen  (336) 349-4544   °[facility] [place]  [employer] [place] [facility] 1) [address], [place] °2) [address], [place] °3)  [address], [place] (336) 349-3220 °(336) 342-7768 ° °(336) 342-8140   °[place] [facility] (336) 342-1394 or (336) 342-3537 (After Hours)    ° ° ° °

## 2014-09-21 NOTE — ED Notes (Signed)
Patient states that he was seen yesterday for the same. Patient states tht he slept on his stomach last night and woke up this AM and has mid abdominal pain that he rates 3/10. Patient denies N/v/D.

## 2014-09-21 NOTE — ED Provider Notes (Signed)
CSN: [id number]     Arrival date & time 09/21/14  1556 History   First MD Initiated Contact with Patient 09/21/14 1909     Chief Complaint  Patient presents with  . Abdominal Pain     (Consider location/radiation/quality/duration/timing/severity/associated sxs/prior Treatment) HPI Patient is a 39 year old male with past medical history of kidney stones. The ER complaining of upper abdominal pain. Patient reports he was seen yesterday for back pain, noted last night to have gradual onset of upper abdominal pain. Patient states he doesn't pain while lying flat, states the pain was made better with getting up walking around. Patient states the symptoms persisted throughout the day today. Patient denies any associated chest pain, shortness of breath, nausea, vomiting, fever, diarrhea, dysuria. Patient denies history of PE/DVT, leg swelling, recent travel.  Past Medical History  Diagnosis Date  . Kidney stone   . Kidney stones   . Kidney stone    History reviewed. No pertinent past surgical history. Family History  Problem Relation Age of Onset  . Heart failure Father   . Cancer Brother    History  Substance Use Topics  . Smoking status: Never Smoker   . Smokeless tobacco: Never Used  . Alcohol Use: No    Review of Systems  Constitutional: Negative for fever.  HENT: Negative for trouble swallowing.   Eyes: Negative for visual disturbance.  Respiratory: Negative for shortness of breath.   Cardiovascular: Negative for chest pain.  Gastrointestinal: Positive for abdominal pain. Negative for nausea and vomiting.  Genitourinary: Negative for dysuria.  Musculoskeletal: Negative for neck pain.  Skin: Negative for rash.  Neurological: Negative for dizziness, weakness and numbness.  Psychiatric/Behavioral: Negative.       Allergies  Review of patient's allergies indicates no known allergies.  Home Medications   Prior to Admission medications   Medication Sig Start Date End  Date Taking? Authorizing Provider  meloxicam (MOBIC) 7.5 MG tablet Take 2 tablets (15 mg total) by mouth daily. 09/20/14  Yes Antony Madura, PA-C  methocarbamol (ROBAXIN) 500 MG tablet Take 1 tablet (500 mg total) by mouth 2 (two) times daily. 09/20/14  Yes Antony Madura, PA-C  omeprazole (PRILOSEC) 20 MG capsule Take 1 capsule (20 mg total) by mouth daily. 30 minutes prior to meal. 09/21/14   Ladona Mow, PA-C   BP 127/95 mmHg  Pulse 60  Temp(Src) 98.1 F (36.7 C) (Oral)  Resp 16  SpO2 99% Physical Exam  Constitutional: He is oriented to person, place, and time. He appears well-developed and well-nourished. No distress.  HENT:  Head: Normocephalic and atraumatic.  Mouth/Throat: Oropharynx is clear and moist. No oropharyngeal exudate.  Eyes: Right eye exhibits no discharge. Left eye exhibits no discharge. No scleral icterus.  Neck: Normal range of motion.  Cardiovascular: Normal rate, regular rhythm and normal heart sounds.   No murmur heard. Pulmonary/Chest: Effort normal and breath sounds normal. No respiratory distress.  Abdominal: Soft. There is tenderness in the epigastric area. There is no rigidity, no guarding, no tenderness at McBurney's point and negative Murphy's sign.  Mild, epigastric abdominal tenderness  Musculoskeletal: Normal range of motion. He exhibits no edema or tenderness.  Neurological: He is alert and oriented to person, place, and time. No cranial nerve deficit. Coordination normal.  Skin: Skin is warm and dry. No rash noted. He is not diaphoretic.  Psychiatric: He has a normal mood and affect.  Nursing note and vitals reviewed.   ED Course  Procedures (including critical care time) Labs Review  Labs Reviewed  LIPASE, BLOOD - Abnormal; Notable for the following:    Lipase 20 (*)    All other components within normal limits  COMPREHENSIVE METABOLIC PANEL - Abnormal; Notable for the following:    Calcium 10.8 (*)    AST 42 (*)    Alkaline Phosphatase 36 (*)    All  other components within normal limits  URINALYSIS, ROUTINE W REFLEX MICROSCOPIC (NOT AT [facility]) - Abnormal; Notable for the following:    APPearance CLOUDY (*)    All other components within normal limits  CBC  TROPONIN I    Imaging Review No results found.   EKG Interpretation   Date/Time:  Tuesday September 21 2014 20:59:36 EDT Ventricular Rate:  62 PR Interval:  206 QRS Duration: 118 QT Interval:  431 QTC Calculation: 438 R Axis:   103 Text Interpretation:  Sinus rhythm Borderline prolonged PR interval  Incomplete right bundle branch block No old tracing to compare Confirmed  by GOLDSTON  MD, SCOTT ([id number]) on 09/21/2014 9:05:43 PM      MDM   Final diagnoses:  Gastric reflux   Patient signs and symptoms consistent with possible gastric reflux. Patient is nontoxic, nonseptic appearing, in no apparent distress.  Patient's pain and other symptoms adequately managed in emergency department.  Fluid bolus given.  Labs, imaging and vitals reviewed.  Patient does not meet the SIRS or Sepsis criteria.  On repeat exam patient does not have a surgical abdomin and there are no peritoneal signs.  No indication of appendicitis, bowel obstruction, bowel perforation, cholecystitis, diverticulitis.  Patient discharged home with symptomatic treatment and given strict instructions for follow-up with primary care physician.  I have also discussed reasons to return immediately to the ER.  Patient expresses understanding and agrees with plan.  BP 127/95 mmHg  Pulse 60  Temp(Src) 98.1 F (36.7 C) (Oral)  Resp 16  SpO2 99%  Signed,  Ladona Mow, PA-C 1:24 AM   Ladona Mow, PA-C 09/22/14 1610  Pricilla Loveless, MD 09/27/14 [phone]

## 2014-10-01 ENCOUNTER — Emergency Department (HOSPITAL_COMMUNITY): Payer: Federal, State, Local not specified - PPO

## 2014-10-01 ENCOUNTER — Encounter (HOSPITAL_COMMUNITY): Payer: Self-pay

## 2014-10-01 ENCOUNTER — Emergency Department (HOSPITAL_COMMUNITY)
Admission: EM | Admit: 2014-10-01 | Discharge: 2014-10-01 | Disposition: A | Discharge status: Home / Self Care | Payer: Federal, State, Local not specified - PPO | Attending: Emergency Medicine | Admitting: Emergency Medicine

## 2014-10-01 DIAGNOSIS — R7989 Other specified abnormal findings of blood chemistry: Secondary | ICD-10-CM | POA: Diagnosis not present

## 2014-10-01 DIAGNOSIS — Z87442 Personal history of urinary calculi: Secondary | ICD-10-CM | POA: Insufficient documentation

## 2014-10-01 DIAGNOSIS — I861 Scrotal varices: Secondary | ICD-10-CM | POA: Insufficient documentation

## 2014-10-01 DIAGNOSIS — N508 Other specified disorders of male genital organs: Secondary | ICD-10-CM | POA: Diagnosis present

## 2014-10-01 DIAGNOSIS — N50812 Left testicular pain: Secondary | ICD-10-CM

## 2014-10-01 LAB — BASIC METABOLIC PANEL
Anion gap: 3 — ABNORMAL LOW (ref 5–15)
BUN: 16 mg/dL (ref 6–20)
CALCIUM: 10.7 mg/dL — AB (ref 8.9–10.3)
CO2: 27 mmol/L (ref 22–32)
Chloride: 105 mmol/L (ref 101–111)
Creatinine, Ser: 1.36 mg/dL — ABNORMAL HIGH (ref 0.61–1.24)
GLUCOSE: 97 mg/dL (ref 65–99)
POTASSIUM: 4.3 mmol/L (ref 3.5–5.1)
SODIUM: 135 mmol/L (ref 135–145)

## 2014-10-01 LAB — CBC WITH DIFFERENTIAL/PLATELET
BASOS ABS: 0 10*3/uL (ref 0.0–0.1)
BASOS PCT: 1 % (ref 0–1)
Eosinophils Absolute: 0.1 10*3/uL (ref 0.0–0.7)
Eosinophils Relative: 3 % (ref 0–5)
HCT: 42.2 % (ref 39.0–52.0)
Hemoglobin: 14.3 g/dL (ref 13.0–17.0)
LYMPHS ABS: 2.2 10*3/uL (ref 0.7–4.0)
Lymphocytes Relative: 51 % — ABNORMAL HIGH (ref 12–46)
MCH: 32.4 pg (ref 26.0–34.0)
MCHC: 33.9 g/dL (ref 30.0–36.0)
MCV: 95.5 fL (ref 78.0–100.0)
MONO ABS: 0.2 10*3/uL (ref 0.1–1.0)
Monocytes Relative: 5 % (ref 3–12)
NEUTROS PCT: 40 % — AB (ref 43–77)
Neutro Abs: 1.7 10*3/uL (ref 1.7–7.7)
Platelets: 228 10*3/uL (ref 150–400)
RBC: 4.42 MIL/uL (ref 4.22–5.81)
RDW: 12.4 % (ref 11.5–15.5)
WBC: 4.2 10*3/uL (ref 4.0–10.5)

## 2014-10-01 LAB — URINALYSIS, ROUTINE W REFLEX MICROSCOPIC
Bilirubin Urine: NEGATIVE
Glucose, UA: NEGATIVE mg/dL
Hgb urine dipstick: NEGATIVE
Ketones, ur: NEGATIVE mg/dL
Leukocytes, UA: NEGATIVE
NITRITE: NEGATIVE
Protein, ur: NEGATIVE mg/dL
SPECIFIC GRAVITY, URINE: 1.019 (ref 1.005–1.030)
Urobilinogen, UA: 0.2 mg/dL (ref 0.0–1.0)
pH: 5.5 (ref 5.0–8.0)

## 2014-10-01 NOTE — Discharge Instructions (Signed)
As discussed you need to follow up to have your labs rechecked in the next 2 weeks with your doctor Varicocele A varicocele is a swelling of veins in the scrotum (the bag of skin that contains the testicles). It is most common in young men. It occurs most often on the left side. Small or painless varicoceles do not need treatment. Most often, this is not a serious problem, but further tests may be needed to confirm the diagnosis. Surgery may be needed if complications of varicoceles arise. Rarely, varicoceles can reoccur after surgery. CAUSES  The swelling is due to blood backing up in the vein that leads from the testicle back to the body. Blood backs up because the valves inside the vein are not working properly. Veins normally return blood to the heart. Valves in veins are supposed to be one-way valves. They should not allow blood to flow backwards. If the valves do not work well, blood can pool in a vein and make it swell. The same thing happens with varicose veins in the leg. SYMPTOMS  A varicocele most often causes no symptoms. When they occur, symptoms include:   Swelling on one side of the scrotum.  Swelling that is more obvious when standing up.  A lumpy feeling in the scrotum.  Heaviness on one side of the scrotum.  Dull ache in the scrotum, especially after exercise or prolonged standing or sitting.  Slower growth or reduced size of the testicle on the side of the varicocele (in young males).  Problems with fertility can arise if the testicle does not grow normally. DIAGNOSIS  Varicocele is usually diagnosed by a physical exam. Sometimes ultrasonography is done. TREATMENT  Usually, varicoceles need no treatment. They are often routinely monitored on exam by your caregiver to ensure they do not slow the growth of the testicle on that side. Treatment may be needed if:  The varicocele is large.  There is a lot of pain.  The varicocele causes a decrease in the size of the  testicle in a growing adolescent.  The other testicle is absent or not normal.  Varicoceles are found on both sides of the scrotum.  There is pain when exercising.  There are fertility problems. There are two types of treatment:  Surgery. The surgeon ties off the swollen veins. Surgery may be done with an incision in the skin or through a laparoscope. The surgery is usually done in an outpatient setting. Outpatient means there is no overnight stay in a hospital.  Embolization. A small tube is placed in a vein and guided into the swollen veins. X-rays are used to guide the small tube. Tiny metal coils or other blocking items are put through the tube. This blocks swollen veins and the flow of blood. This is usually done in an outpatient setting without the use of general anesthesia. HOME CARE INSTRUCTIONS  To decrease discomfort:  Wear supportive underwear.  Use an athletic supporter for sports.  Only take over-the-counter or prescription medicines for pain or discomfort as directed by your caregiver. SEEK MEDICAL CARE IF:   Pain is increasing.  Swelling does not decrease when lying down.  Testicle is smaller.  The testicle becomes enlarged, swollen, red, or painful. Document Released: 05/21/2000 Document Revised: 05/07/2011 Document Reviewed: 05/25/2009 [facility] Patient Information 2015 [place], [place]. This information is not intended to replace advice given to you by your health care provider. Make sure you discuss any questions you have with your health care provider.

## 2014-10-01 NOTE — ED Provider Notes (Signed)
CSN: [id number]     Arrival date & time 10/01/14  0745 History   First MD Initiated Contact with Patient 10/01/14 0750     Chief Complaint  Patient presents with  . Testicle Pain     (Consider location/radiation/quality/duration/timing/severity/associated sxs/prior Treatment) HPI Comments: Pt comes in with c/o left testicle pain that started about 10 days ago. He states that he was diagnosed with gastritis when he was seen in the er a week ago. He denies fever,vomiting. He did have diarrhea 2 days ago. The pain also goes to bilateral lower abdomen. Nothing makes the pain better or worse. He states that he doesn't have dysuria or discharge. Does have a history of kidney stone but this doesn't feel familiar.   The history is provided by the patient. No language interpreter was used.    Past Medical History  Diagnosis Date  . Kidney stone   . Kidney stones   . Kidney stone   . Kidney stones    History reviewed. No pertinent past surgical history. Family History  Problem Relation Age of Onset  . Heart failure Father   . Cancer Brother    History  Substance Use Topics  . Smoking status: Never Smoker   . Smokeless tobacco: Never Used  . Alcohol Use: No    Review of Systems  All other systems reviewed and are negative.     Allergies  Review of patient's allergies indicates no known allergies.  Home Medications   Prior to Admission medications   Medication Sig Start Date End Date Taking? Authorizing Provider  ibuprofen (ADVIL) 200 MG tablet Take 200 mg by mouth every 6 (six) hours as needed for moderate pain.   Yes Historical Provider, MD  meloxicam (MOBIC) 7.5 MG tablet Take 2 tablets (15 mg total) by mouth daily. Patient not taking: Reported on 10/01/2014 09/20/14   Antony Madura, PA-C  methocarbamol (ROBAXIN) 500 MG tablet Take 1 tablet (500 mg total) by mouth 2 (two) times daily. Patient not taking: Reported on 10/01/2014 09/20/14   Antony Madura, PA-C  omeprazole (PRILOSEC) 20  MG capsule Take 1 capsule (20 mg total) by mouth daily. 30 minutes prior to meal. Patient not taking: Reported on 10/01/2014 09/21/14   Ladona Mow, PA-C   BP 135/95 mmHg  Pulse 66  Temp(Src) 97.9 F (36.6 C) (Oral)  Resp 18  Ht 5\' 5"  (1.651 m)  Wt 175 lb (79.379 kg)  BMI 29.12 kg/m2  SpO2 100% Physical Exam  Constitutional: He is oriented to person, place, and time. He appears well-developed and well-nourished.  HENT:  Head: Normocephalic and atraumatic.  Cardiovascular: Normal rate and regular rhythm.   Pulmonary/Chest: Effort normal and breath sounds normal.  Abdominal: Soft. Bowel sounds are normal. There is no tenderness.  Genitourinary: Penis normal.  No hernia noted. No discharge noted  Musculoskeletal: Normal range of motion.  Neurological: He is alert and oriented to person, place, and time. Coordination normal.  Skin: Skin is dry.  Nursing note and vitals reviewed.   ED Course  Procedures (including critical care time) Labs Review Labs Reviewed  BASIC METABOLIC PANEL - Abnormal; Notable for the following:    Creatinine, Ser 1.36 (*)    Calcium 10.7 (*)    Anion gap 3 (*)    All other components within normal limits  CBC WITH DIFFERENTIAL/PLATELET - Abnormal; Notable for the following:    Neutrophils Relative % 40 (*)    Lymphocytes Relative 51 (*)    All other components  within normal limits  URINALYSIS, ROUTINE W REFLEX MICROSCOPIC (NOT AT [facility])    Imaging Review US Scrotum  10/01/2014   CLINICAL DATA:  Left testicular discomfort for 2 weeks.  EXAM: ULTRASOUND OF SCROTUM  TECHNIQUE: Complete ultrasound examination of the testicles, epididymis, and other scrotal structures was performed.  COMPARISON:  None.  FINDINGS: Right testicle  Measurements: 4.6 x 2.0 x 2.7 cm. No mass or microlithiasis visualized.  Left testicle  Measurements: 4.8 x 2.2 x 2.7 cm. No mass or microlithiasis visualized.  Right epididymis:  Normal in size and appearance.  Left epididymis:  Normal  in size and appearance.  Hydrocele:  None visualized.  Varicocele:  Mild varicocele on the left  IMPRESSION: No evidence for testicular mass or other significant abnormality.  Left varicocele.   Electronically Signed   By: Charlett Nose M.D.   On: 10/01/2014 10:14   [place] Art/ven Flow Abd Pelv Doppler  10/01/2014   CLINICAL DATA:  Left testicular discomfort for 2 weeks.  EXAM: ULTRASOUND OF SCROTUM  TECHNIQUE: Complete ultrasound examination of the testicles, epididymis, and other scrotal structures was performed.  COMPARISON:  None.  FINDINGS: Right testicle  Measurements: 4.6 x 2.0 x 2.7 cm. No mass or microlithiasis visualized.  Left testicle  Measurements: 4.8 x 2.2 x 2.7 cm. No mass or microlithiasis visualized.  Right epididymis:  Normal in size and appearance.  Left epididymis:  Normal in size and appearance.  Hydrocele:  None visualized.  Varicocele:  Mild varicocele on the left  IMPRESSION: No evidence for testicular mass or other significant abnormality.  Left varicocele.   Electronically Signed   By: Charlett Nose M.D.   On: 10/01/2014 10:14     EKG Interpretation None      MDM   Final diagnoses:  Left varicocele  Elevated serum creatinine    Discussed finding with pt and pt given urology follow up . Pt okay with plan.   Teressa Lower, NP 10/01/14 1043  Azalia Bilis, MD 10/01/14 [phone]

## 2014-10-01 NOTE — ED Notes (Signed)
Patient unable to void, water given.

## 2014-10-01 NOTE — ED Notes (Signed)
Patient c/o left testicular pain that radiates into the left groin area and left lower abdomen. patiaent states he was seen a week ago for the same.

## 2014-10-01 NOTE — ED Notes (Signed)
Urine sample requested.  Patient unable to void at this time.

## 2014-11-06 ENCOUNTER — Emergency Department (HOSPITAL_COMMUNITY): Payer: Federal, State, Local not specified - PPO

## 2014-11-06 ENCOUNTER — Emergency Department (HOSPITAL_COMMUNITY)
Admission: EM | Admit: 2014-11-06 | Discharge: 2014-11-06 | Disposition: A | Discharge status: Home / Self Care | Payer: Federal, State, Local not specified - PPO | Attending: Emergency Medicine | Admitting: Emergency Medicine

## 2014-11-06 ENCOUNTER — Encounter (HOSPITAL_COMMUNITY): Payer: Self-pay

## 2014-11-06 DIAGNOSIS — N50812 Left testicular pain: Secondary | ICD-10-CM

## 2014-11-06 DIAGNOSIS — N508 Other specified disorders of male genital organs: Secondary | ICD-10-CM | POA: Diagnosis present

## 2014-11-06 DIAGNOSIS — Z87442 Personal history of urinary calculi: Secondary | ICD-10-CM | POA: Insufficient documentation

## 2014-11-06 DIAGNOSIS — N50819 Testicular pain, unspecified: Secondary | ICD-10-CM

## 2014-11-06 DIAGNOSIS — Z792 Long term (current) use of antibiotics: Secondary | ICD-10-CM | POA: Diagnosis not present

## 2014-11-06 LAB — URINALYSIS, ROUTINE W REFLEX MICROSCOPIC
BILIRUBIN URINE: NEGATIVE
GLUCOSE, UA: NEGATIVE mg/dL
Hgb urine dipstick: NEGATIVE
Ketones, ur: NEGATIVE mg/dL
Leukocytes, UA: NEGATIVE
NITRITE: NEGATIVE
PH: 5.5 (ref 5.0–8.0)
Protein, ur: NEGATIVE mg/dL
SPECIFIC GRAVITY, URINE: 1.02 (ref 1.005–1.030)
Urobilinogen, UA: 0.2 mg/dL (ref 0.0–1.0)

## 2014-11-06 MED ORDER — IBUPROFEN 600 MG PO TABS: 600.0000 mg | ORAL_TABLET | Freq: Three times a day (TID) | ORAL | Status: DC | PRN

## 2014-11-06 NOTE — Discharge Instructions (Signed)
Testicular Self-Exam  A self-examination of your testicles involves looking at and feeling your testicles for abnormal lumps or swelling. Several things can cause swelling, lumps, or pain in your testicles. Some of these causes are:  · Injuries.  · Inflammation.  · Infection.  · Accumulation of fluids around your testicle (hydrocele).  · Twisted testicles (testicular torsion).  · Testicular cancer.  Self-examination of the testicles and groin areas may be advised if you are at risk for testicular cancer. Risks for testicular cancer include:  · An undescended testicle (cryptorchidism).  · A history of previous testicular cancer.  · A family history of testicular cancer.  The testicles are easiest to examine after warm baths or showers and are more difficult to examine when you are cold. This is because the muscles attached to the testicles retract and pull them up higher or into the abdomen.  Follow these steps while you are standing:  · Hold your penis away from your body.  · Roll one testicle between your thumb and forefinger, feeling the entire testicle.  · Roll the other testicle between your thumb and forefinger, feeling the entire testicle.  Feel for lumps, swelling, or discomfort. A normal testicle is egg shaped and feels firm. It is smooth and not tender. The spermatic cord can be felt as a firm spaghetti-like cord at the back of your testicle. It is also important to examine the crease between the front of your leg and your abdomen. Feel for any bumps that are tender. These could be enlarged lymph nodes.   Document Released: 05/21/2000 Document Revised: 10/15/2012 Document Reviewed: 08/04/2012  [employer]® Patient Information ©2015 [employer]. This information is not intended to replace advice given to you by your health care provider. Make sure you discuss any questions you have with your health care provider.

## 2014-11-06 NOTE — ED Notes (Signed)
Pt states seen in July for testicular pain radiating to abdomen.  Given antibiotics.  Unknown cause.  Pt here today for same.  States testicular pain with no swelling.  No discharge.  No change in urination or difficulty.  Unknown for fever. Nausea with no vomiting.  Loss of appetite.

## 2014-11-07 NOTE — ED Provider Notes (Signed)
CSN: [id number]     Arrival date & time 11/06/14  7829 History   First MD Initiated Contact with Patient 11/06/14 0805     Chief Complaint  Patient presents with  . Abdominal Pain  . Testicle Pain      HPI Patient reports ongoing discomfort in his bilateral testicles left greater than right.  He reports that the pain radiates from his scrotum and testicles up to his hips bilaterally and begins wraparound his groin.  He reports the pain and discomfort is intermittent.  He has occasional nausea but denies vomiting.  He denies flank pain at this time.  He states that during the day he does not notice the discomfort of pain much but he primarily notices the discomfort when he lays down to go to sleep at night.  Fevers or chills.  Denies dysuria or urinary frequency.  No penile pain.  Patient was seen for similar symptoms in early August 2016 underwent ultrasound of his scrotum and testicles which demonstrated only a small varicocele.  He followed up with urology who reported no significant abnormalities but placed him on a course of doxycycline.  Patient reports he has ongoing discomfort and requests repeat evaluation at this time.  Past Medical History  Diagnosis Date  . Kidney stone   . Kidney stones   . Kidney stone   . Kidney stones    History reviewed. No pertinent past surgical history. Family History  Problem Relation Age of Onset  . Heart failure Father   . Cancer Brother    Social History  Substance Use Topics  . Smoking status: Never Smoker   . Smokeless tobacco: Never Used  . Alcohol Use: No    Review of Systems  All other systems reviewed and are negative.     Allergies  Review of patient's allergies indicates no known allergies.  Home Medications   Prior to Admission medications   Medication Sig Start Date End Date Taking? Authorizing Provider  doxycycline (VIBRA-TABS) 100 MG tablet Take 100 mg by mouth 2 (two) times daily. 10/26/14 11/09/14 Yes Historical  Provider, MD  ibuprofen (ADVIL,MOTRIN) 600 MG tablet Take 1 tablet (600 mg total) by mouth every 8 (eight) hours as needed. 11/06/14   Azalia Bilis, MD   BP 122/79 mmHg  Pulse 71  Temp(Src) 98.3 F (36.8 C) (Oral)  Resp 16  SpO2 97% Physical Exam  Constitutional: He is oriented to person, place, and time. He appears well-developed and well-nourished.  HENT:  Head: Normocephalic.  Eyes: EOM are normal.  Neck: Normal range of motion.  Pulmonary/Chest: Effort normal.  Abdominal: He exhibits no distension and no mass. There is no tenderness. There is no rebound.  Genitourinary:  Normal circumcised penis.  Normal scrotum.  Palpation of testes bilaterally is normal.  No significant tenderness of his testicles.  No inguinal hernias present.  Musculoskeletal: Normal range of motion.  Neurological: He is alert and oriented to person, place, and time.  Psychiatric: He has a normal mood and affect.  Nursing note and vitals reviewed.   ED Course  Procedures (including critical care time) Labs Review Labs Reviewed  URINALYSIS, ROUTINE W REFLEX MICROSCOPIC (NOT AT [facility])    Imaging Review US Scrotum  11/06/2014   CLINICAL DATA:  Ongoing left testicular pain  EXAM: SCROTAL ULTRASOUND  DOPPLER ULTRASOUND OF THE TESTICLES  TECHNIQUE: Complete ultrasound examination of the testicles, epididymis, and other scrotal structures was performed. Color and spectral Doppler ultrasound were also utilized to evaluate blood  flow to the testicles.  COMPARISON:  10/01/2014  FINDINGS: Right testicle  Measurements: 45 x 19 x 28 mm. No mass or microlithiasis visualized.  Left testicle  Measurements: 46 x 21 x 26 mm. No mass or microlithiasis visualized.  Right epididymis:  Normal in size and appearance.  Left epididymis:  Normal in size and appearance.  Small volume bilateral scrotal fluid, simple appearing.  Varicocele: Left-sided veins dilate with Valsalva to 5 mm, consistent with varicocele.  Pulsed Doppler  interrogation of both testes demonstrates normal low resistance arterial and venous waveforms bilaterally.  IMPRESSION: 1. No acute finding or change from 10/01/2014. 2. Left varicocele.   Electronically Signed   By: Marnee Spring M.D.   On: 11/06/2014 11:29   [place] Art/ven Flow Abd Pelv Doppler  11/06/2014   CLINICAL DATA:  Ongoing left testicular pain  EXAM: SCROTAL ULTRASOUND  DOPPLER ULTRASOUND OF THE TESTICLES  TECHNIQUE: Complete ultrasound examination of the testicles, epididymis, and other scrotal structures was performed. Color and spectral Doppler ultrasound were also utilized to evaluate blood flow to the testicles.  COMPARISON:  10/01/2014  FINDINGS: Right testicle  Measurements: 45 x 19 x 28 mm. No mass or microlithiasis visualized.  Left testicle  Measurements: 46 x 21 x 26 mm. No mass or microlithiasis visualized.  Right epididymis:  Normal in size and appearance.  Left epididymis:  Normal in size and appearance.  Small volume bilateral scrotal fluid, simple appearing.  Varicocele: Left-sided veins dilate with Valsalva to 5 mm, consistent with varicocele.  Pulsed Doppler interrogation of both testes demonstrates normal low resistance arterial and venous waveforms bilaterally.  IMPRESSION: 1. No acute finding or change from 10/01/2014. 2. Left varicocele.   Electronically Signed   By: Marnee Spring M.D.   On: 11/06/2014 11:29   I have personally reviewed and evaluated these images and lab results as part of my medical decision-making.   EKG Interpretation None      MDM   Final diagnoses:  Testicular pain      Outpatient primary care and urology follow-up.  Ultrasound imaging today without abnormality.  Abdominal exam is benign.  Vital signs are normal.  Discharge home in good condition.  Azalia Bilis, MD 11/07/14 [phone]

## 2015-10-03 IMAGING — US US ART/VEN ABD/PELV/SCROTUM DOPPLER LTD
1 series · 14 of 25 positions shown · non-contrast
Comparison: 10/01/2014

CLINICAL DATA: Ongoing left testicular pain

EXAM:
SCROTAL ULTRASOUND
DOPPLER ULTRASOUND OF THE TESTICLES
TECHNIQUE: Complete ultrasound examination of the testicles, epididymis, and
other scrotal structures was performed. Color and spectral Doppler
ultrasound were also utilized to evaluate blood flow to the
testicles.

[Series 1: us art/ven abd/pelv/scrotum doppler ltd · 0.04mm/px · 14 of 55 slices shown]
[im 1/55]
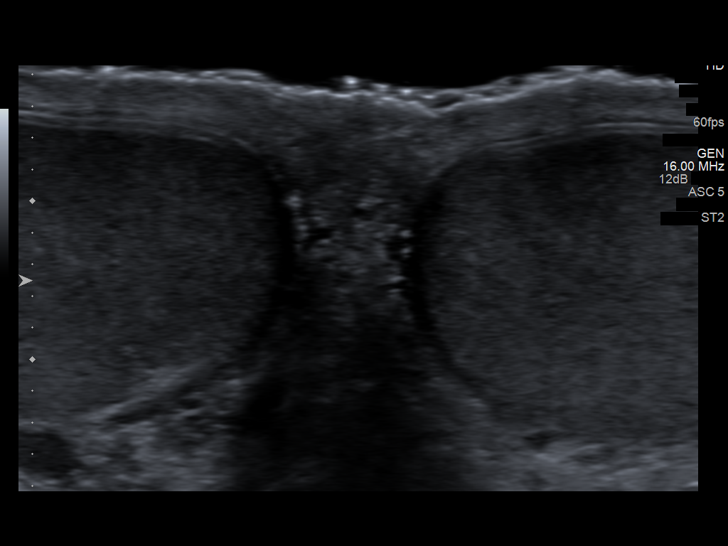
[im 5/55]
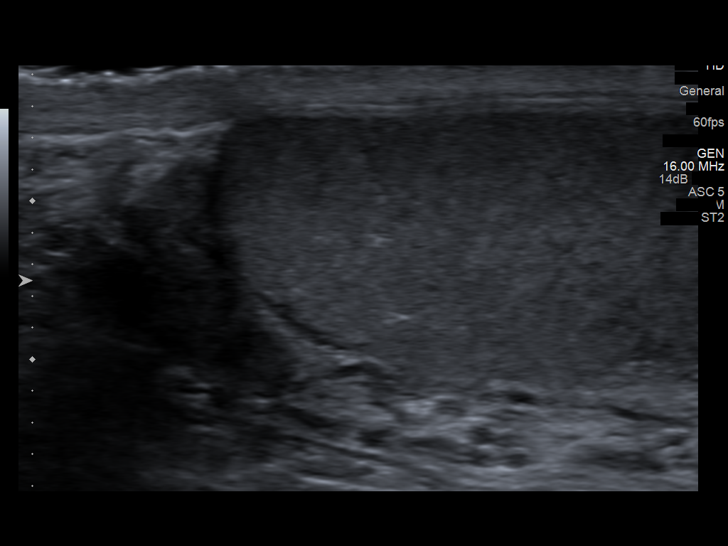
[im 10/55]
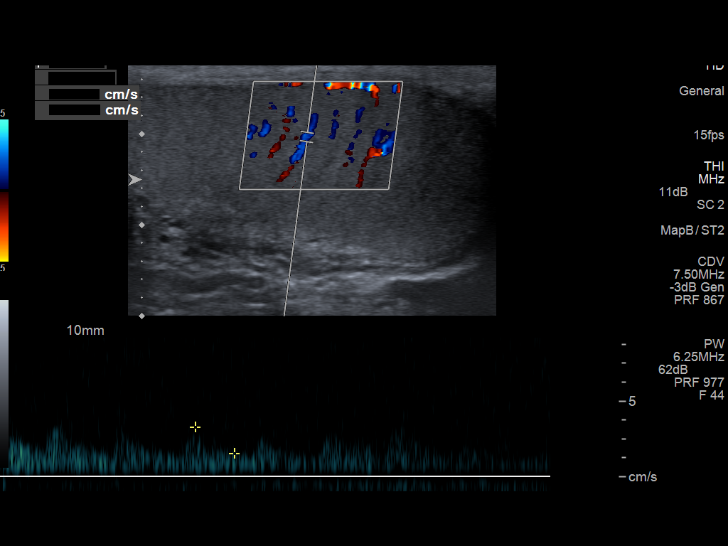
[im 14/55]
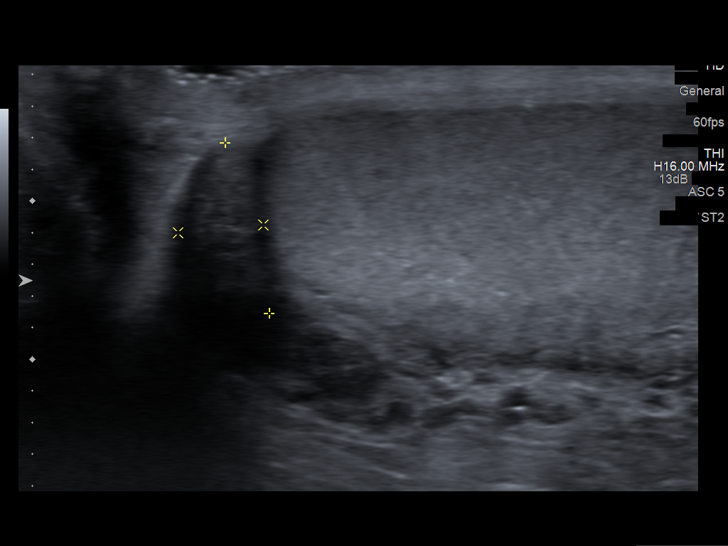
[im 19/55]
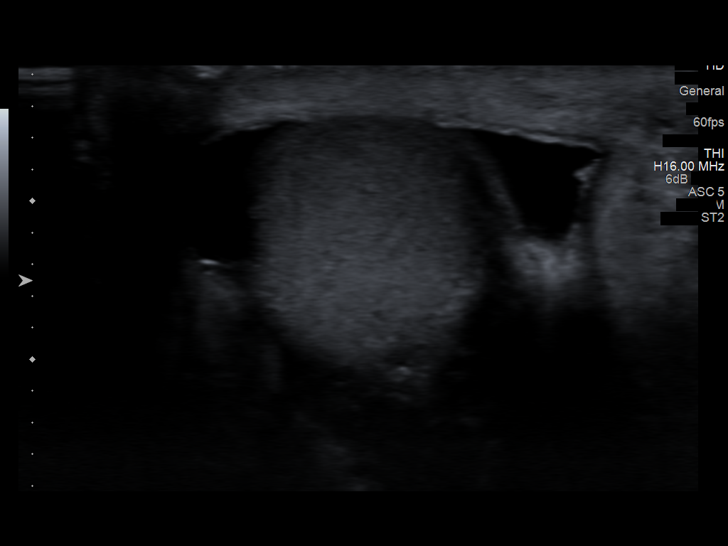
[im 21/55]
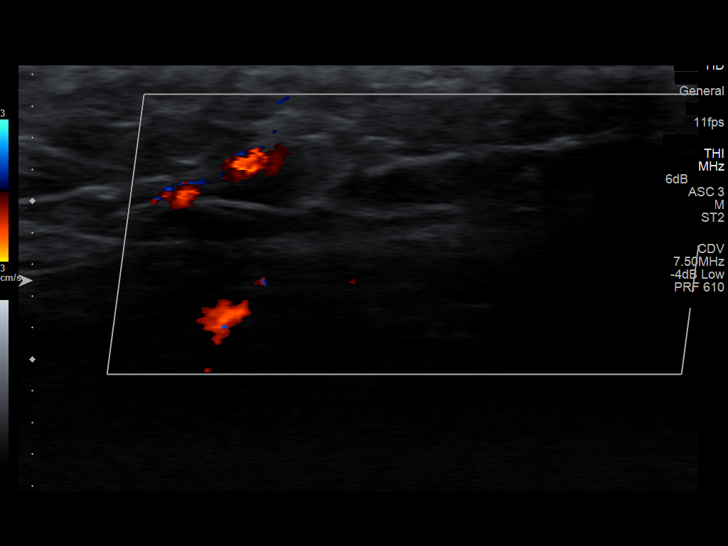
[im 25/55]
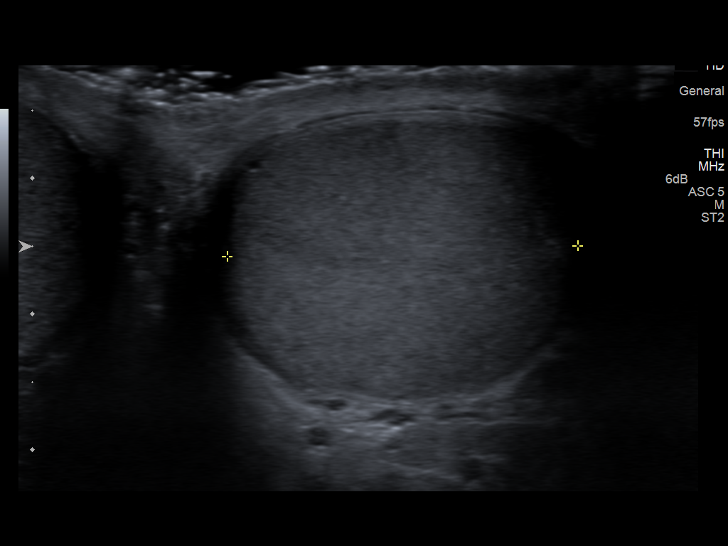
[im 30/55]
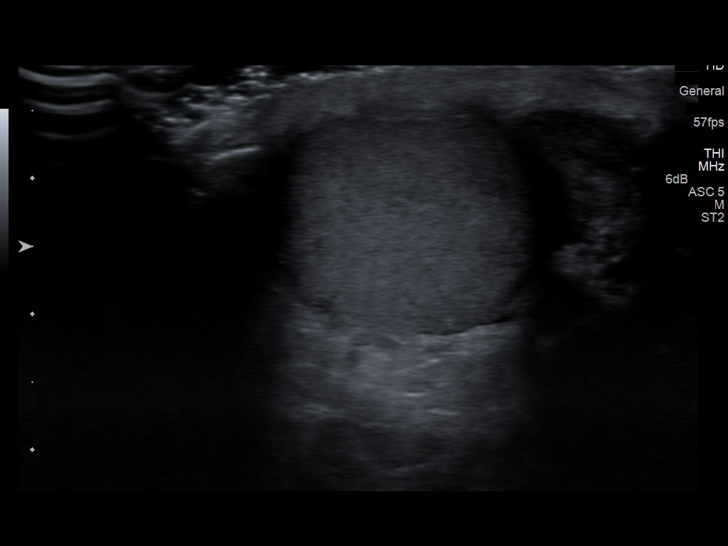
[im 34/55]
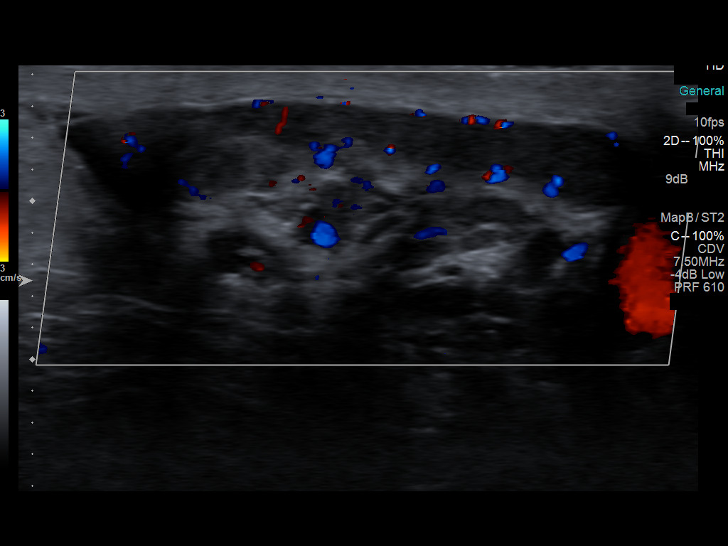
[im 37/55]
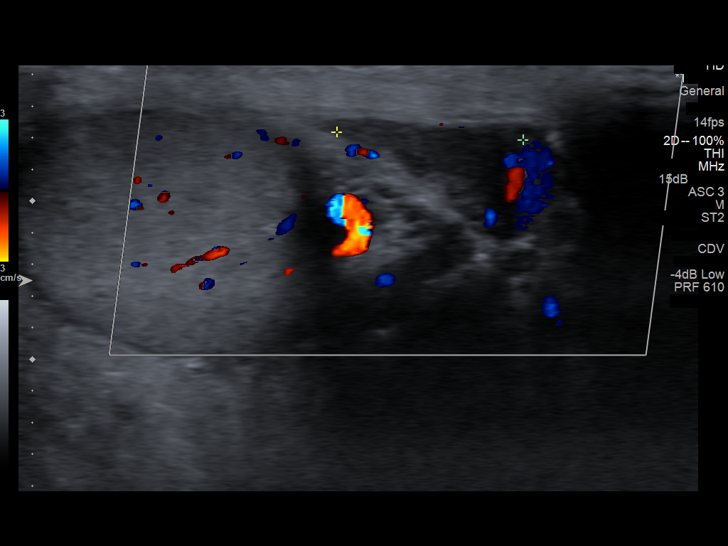
[im 41/55]
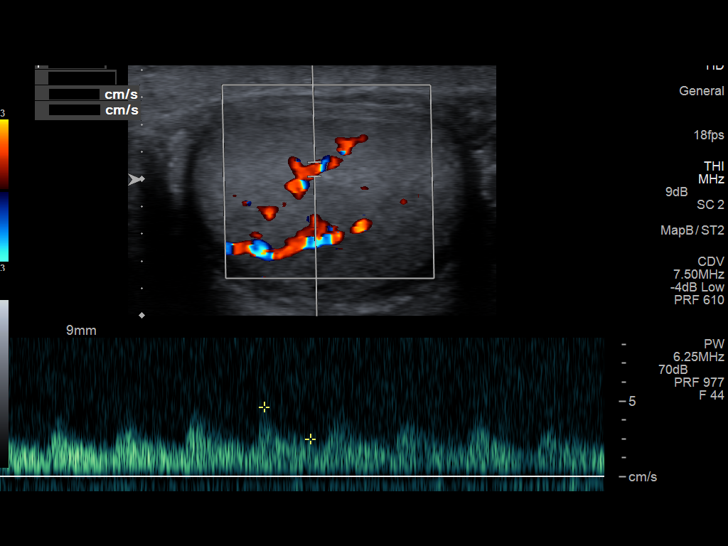
[im 46/55]
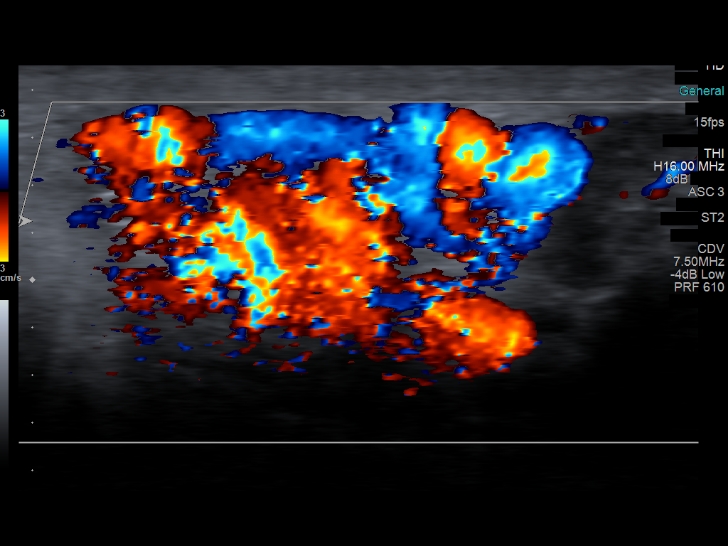
[im 50/55]
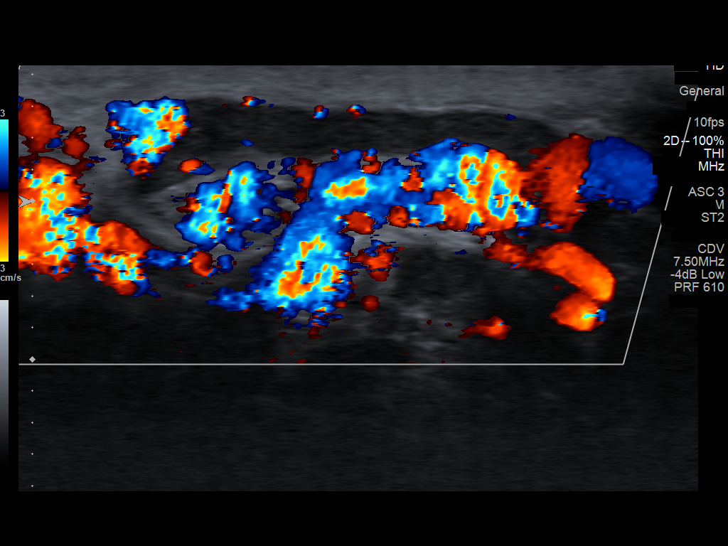
[im 55/55]
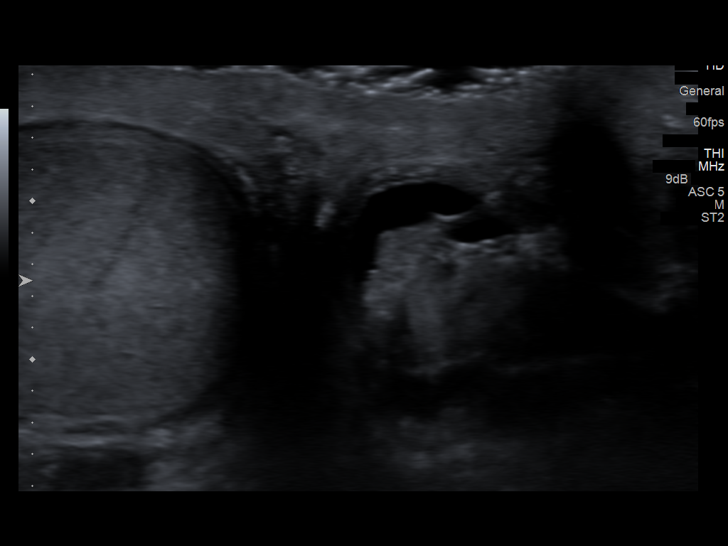

[14 of 25 positions shown; findings below may reference images not displayed]

FINDINGS: Right testicle

Measurements: 45 x 19 x 28 mm. No mass or microlithiasis visualized.

Left testicle

Measurements: 46 x 21 x 26 mm. No mass or microlithiasis visualized.

Right epididymis:  Normal in size and appearance.

Left epididymis:  Normal in size and appearance.

Small volume bilateral scrotal fluid, simple appearing.

Varicocele: Left-sided veins dilate with Valsalva to 5 mm,
consistent with varicocele.

Pulsed Doppler interrogation of both testes demonstrates normal low
resistance arterial and venous waveforms bilaterally.
IMPRESSION: 1. No acute finding or change from 10/01/2014.
2. Left varicocele.

## 2019-02-12 DIAGNOSIS — N132 Hydronephrosis with renal and ureteral calculous obstruction: Secondary | ICD-10-CM | POA: Diagnosis not present

## 2019-02-12 DIAGNOSIS — N200 Calculus of kidney: Secondary | ICD-10-CM | POA: Diagnosis not present

## 2019-03-10 ENCOUNTER — Other Ambulatory Visit: Payer: Self-pay

## 2019-03-10 ENCOUNTER — Encounter (HOSPITAL_COMMUNITY): Payer: Self-pay

## 2019-03-10 ENCOUNTER — Emergency Department (HOSPITAL_COMMUNITY): Payer: Federal, State, Local not specified - PPO

## 2019-03-10 ENCOUNTER — Emergency Department (HOSPITAL_COMMUNITY)
Admission: EM | Admit: 2019-03-10 | Discharge: 2019-03-10 | Disposition: A | Discharge status: Home / Self Care | Payer: Federal, State, Local not specified - PPO | Attending: Emergency Medicine | Admitting: Emergency Medicine

## 2019-03-10 DIAGNOSIS — U071 COVID-19: Secondary | ICD-10-CM | POA: Diagnosis not present

## 2019-03-10 DIAGNOSIS — Z209 Contact with and (suspected) exposure to unspecified communicable disease: Secondary | ICD-10-CM | POA: Diagnosis not present

## 2019-03-10 DIAGNOSIS — S22078A Other fracture of T9-T10 vertebra, initial encounter for closed fracture: Secondary | ICD-10-CM | POA: Insufficient documentation

## 2019-03-10 DIAGNOSIS — I951 Orthostatic hypotension: Secondary | ICD-10-CM | POA: Diagnosis not present

## 2019-03-10 DIAGNOSIS — Y929 Unspecified place or not applicable: Secondary | ICD-10-CM | POA: Insufficient documentation

## 2019-03-10 DIAGNOSIS — Y939 Activity, unspecified: Secondary | ICD-10-CM | POA: Insufficient documentation

## 2019-03-10 DIAGNOSIS — W010XXA Fall on same level from slipping, tripping and stumbling without subsequent striking against object, initial encounter: Secondary | ICD-10-CM | POA: Diagnosis not present

## 2019-03-10 DIAGNOSIS — R52 Pain, unspecified: Secondary | ICD-10-CM | POA: Diagnosis not present

## 2019-03-10 DIAGNOSIS — I4510 Unspecified right bundle-branch block: Secondary | ICD-10-CM | POA: Diagnosis not present

## 2019-03-10 DIAGNOSIS — N200 Calculus of kidney: Secondary | ICD-10-CM | POA: Diagnosis not present

## 2019-03-10 DIAGNOSIS — Z79899 Other long term (current) drug therapy: Secondary | ICD-10-CM | POA: Insufficient documentation

## 2019-03-10 DIAGNOSIS — Y999 Unspecified external cause status: Secondary | ICD-10-CM | POA: Diagnosis not present

## 2019-03-10 DIAGNOSIS — R55 Syncope and collapse: Secondary | ICD-10-CM | POA: Diagnosis not present

## 2019-03-10 DIAGNOSIS — N202 Calculus of kidney with calculus of ureter: Secondary | ICD-10-CM | POA: Diagnosis not present

## 2019-03-10 LAB — COMPREHENSIVE METABOLIC PANEL
ALT: 28 U/L (ref 0–44)
AST: 41 U/L (ref 15–41)
Albumin: 4 g/dL (ref 3.5–5.0)
Alkaline Phosphatase: 32 U/L — ABNORMAL LOW (ref 38–126)
Anion gap: 7 (ref 5–15)
BUN: 13 mg/dL (ref 6–20)
CO2: 24 mmol/L (ref 22–32)
Calcium: 9.5 mg/dL (ref 8.9–10.3)
Chloride: 100 mmol/L (ref 98–111)
Creatinine, Ser: 1.45 mg/dL — ABNORMAL HIGH (ref 0.61–1.24)
GFR calc Af Amer: >60 mL/min (ref >60)
GFR calc non Af Amer: 58 mL/min — ABNORMAL LOW (ref >60)
Glucose, Bld: 90 mg/dL (ref 70–99)
Potassium: 4.2 mmol/L (ref 3.5–5.1)
Sodium: 131 mmol/L — ABNORMAL LOW (ref 135–145)
Total Bilirubin: 0.9 mg/dL (ref 0.3–1.2)
Total Protein: 6.5 g/dL (ref 6.5–8.1)

## 2019-03-10 LAB — CBC WITH DIFFERENTIAL/PLATELET
Abs Immature Granulocytes: 0.01 10*3/uL (ref 0.00–0.07)
Basophils Absolute: 0 10*3/uL (ref 0.0–0.1)
Basophils Relative: 1 %
Eosinophils Absolute: 0 10*3/uL (ref 0.0–0.5)
Eosinophils Relative: 1 %
HCT: 35 % — ABNORMAL LOW (ref 39.0–52.0)
Hemoglobin: 11.2 g/dL — ABNORMAL LOW (ref 13.0–17.0)
Immature Granulocytes: 0 %
Lymphocytes Relative: 24 %
Lymphs Abs: 0.8 10*3/uL (ref 0.7–4.0)
MCH: 31.9 pg (ref 26.0–34.0)
MCHC: 32 g/dL (ref 30.0–36.0)
MCV: 99.7 fL (ref 80.0–100.0)
Monocytes Absolute: 0.2 10*3/uL (ref 0.1–1.0)
Monocytes Relative: 6 %
Neutro Abs: 2.3 10*3/uL (ref 1.7–7.7)
Neutrophils Relative %: 68 %
Platelets: UNDETERMINED 10*3/uL (ref 150–400)
RBC: 3.51 MIL/uL — ABNORMAL LOW (ref 4.22–5.81)
RDW: 12.1 % (ref 11.5–15.5)
WBC: 3.3 10*3/uL — ABNORMAL LOW (ref 4.0–10.5)
nRBC: 0 % (ref 0.0–0.2)

## 2019-03-10 LAB — URINALYSIS, ROUTINE W REFLEX MICROSCOPIC
Bilirubin Urine: NEGATIVE
Glucose, UA: NEGATIVE mg/dL
Hgb urine dipstick: NEGATIVE
Ketones, ur: NEGATIVE mg/dL
Leukocytes,Ua: NEGATIVE
Nitrite: NEGATIVE
Protein, ur: NEGATIVE mg/dL
Specific Gravity, Urine: 1.02 (ref 1.005–1.030)
pH: 6 (ref 5.0–8.0)

## 2019-03-10 LAB — POC SARS CORONAVIRUS 2 AG -  ED: SARS Coronavirus 2 Ag: POSITIVE — AB

## 2019-03-10 MED ORDER — SODIUM CHLORIDE 0.9 % IV BOLUS
1000.0000 mL | Freq: Once | INTRAVENOUS | Status: AC
  Administered 2019-03-10: 1000 mL via INTRAVENOUS

## 2019-03-10 MED ORDER — IOHEXOL 300 MG/ML  SOLN
100.0000 mL | Freq: Once | INTRAMUSCULAR | Status: AC | PRN
  Administered 2019-03-10: 100 mL via INTRAVENOUS

## 2019-03-10 MED ORDER — SODIUM CHLORIDE (PF) 0.9 % IJ SOLN
INTRAMUSCULAR | Status: AC
  Filled 2019-03-10: qty 50

## 2019-03-10 NOTE — ED Triage Notes (Signed)
EMS reports from home, syncopal episode this morning, general body aches yesterday, denies NVD, SOB or cough.. BP 90/60 on scene without orthostatic changes. Pt Dx with kidney stone since December with pain in lower right back. Wife being tested due to several people at her work Covid Positive.  BP 92/59 HR 60 RR 16 Sp02 97 RA CBG 105 Temp 97.0 (Tympanic)  18 LAC  500 mg NS enroute

## 2019-03-10 NOTE — ED Provider Notes (Signed)
[facility]-EMERGENCY DEPT Provider Note   CSN: [id number] Arrival date & time: 03/10/19  4656     History Chief Complaint  Patient presents with  . Covid +  . Loss of Consciousness    Christopher Lara is a 44 y.o. male presenting for evaluation after syncopal event.  Patient states he got out of bed, felt lightheaded, and passed out.  His wife heard him fall, he immediately regained consciousness.  He denies chest pain, shortness of breath, or dizziness prior to the fall.  He reports no pain since he fell.  He has been able to ambulate since.  He denies lightheadedness since.  He reports a chronic left low back pain which has been attributed to a kidney stone that has not passed for several weeks.  He has a follow-up appointment with urology next week.  Patient states yesterday he was not feeling well, he had body aches and chills.  He does not eat very much.  His wife is currently being tested for Covid due to multiple sick contacts at work.  He denies fevers, chest pain, shortness of breath, nausea, vomiting, anterior domino pain, urinary symptoms, abnormal bowel movements.  He states no medical history other than frequent kidney stones.  Takes no medicines daily.  He has Zofran and pain medication at home that he takes as needed for his kidney stone.   HPI     Past Medical History:  Diagnosis Date  . Kidney stone   . Kidney stone   . Kidney stones   . Kidney stones     There are no problems to display for this patient.   History reviewed. No pertinent surgical history.     Family History  Problem Relation Age of Onset  . Heart failure Father   . Cancer Brother     Social History   Tobacco Use  . Smoking status: Never Smoker  . Smokeless tobacco: Never Used  Substance Use Topics  . Alcohol use: No  . Drug use: No    Home Medications Prior to Admission medications   Medication Sig Start Date End Date Taking? Authorizing Provider    acetaminophen (TYLENOL) 500 MG tablet Take 500 mg by mouth every 6 (six) hours as needed for headache.   Yes [provider]  ondansetron (ZOFRAN) 4 MG tablet Take 4 mg by mouth 3 (three) times daily as needed. 02/12/19  Yes [provider]  oxyCODONE-acetaminophen (PERCOCET/ROXICET) 5-325 MG tablet Take 1 tablet by mouth daily as needed for moderate pain.  02/12/19  Yes [provider]  tamsulosin (FLOMAX) 0.4 MG CAPS capsule Take 0.4 mg by mouth daily. 02/12/19  Yes [provider]  ibuprofen (ADVIL,MOTRIN) 600 MG tablet Take 1 tablet (600 mg total) by mouth every 8 (eight) hours as needed. Patient not taking: Reported on 03/10/2019 11/06/14   Azalia Bilis, MD  omeprazole (PRILOSEC) 20 MG capsule Take 1 capsule (20 mg total) by mouth daily. 30 minutes prior to meal. Patient not taking: Reported on 10/01/2014 09/21/14 11/06/14  Ladona Mow, PA-C    Allergies    Patient has no known allergies.  Review of Systems   Review of Systems  Constitutional: Positive for chills (yesterday, resolved).  Genitourinary: Positive for flank pain (L low back/flank, several weeks).  Musculoskeletal: Positive for myalgias (yesterday, resolved).  Neurological: Positive for syncope and light-headedness.  All other systems reviewed and are negative.   Physical Exam Updated Vital Signs BP 118/77   Pulse 77  Temp 98.1 F (36.7 C) (Oral)   Resp 16   SpO2 99%   Physical Exam Vitals and nursing note reviewed.  Constitutional:      General: He is not in acute distress.    Appearance: He is well-developed. He is ill-appearing.     Comments: Appears as if he feels ill, but otherwise nontoxic.  HENT:     Head: Normocephalic and atraumatic.  Eyes:     Extraocular Movements: Extraocular movements intact.     Conjunctiva/sclera: Conjunctivae normal.     Pupils: Pupils are equal, round, and reactive to light.  Neck:     Comments: No tenderness palpation midline C-spine.   Full active range of motion of the head without pain. Cardiovascular:     Rate and Rhythm: Normal rate and regular rhythm.     Pulses: Normal pulses.  Pulmonary:     Effort: Pulmonary effort is normal. No respiratory distress.     Breath sounds: Normal breath sounds. No wheezing.     Comments: Clear lung sounds in all fields.  No wheezing, rales, rhonchi.  Speaking in full sentences.  No signs of respiratory distress.  Sats stable on room air. Abdominal:     General: There is no distension.     Palpations: Abdomen is soft. There is no mass.     Tenderness: There is no abdominal tenderness. There is no guarding or rebound.  Musculoskeletal:        General: Normal range of motion.     Cervical back: Normal range of motion and neck supple.     Comments: TTP of L low back.  No pain over midline spine.  No step-offs or deformities.  Skin:    General: Skin is warm and dry.     Capillary Refill: Capillary refill takes less than 2 seconds.  Neurological:     Mental Status: He is alert and oriented to person, place, and time.     ED Results / Procedures / Treatments   Labs (all labs ordered are listed, but only abnormal results are displayed) Labs Reviewed  CBC WITH DIFFERENTIAL/PLATELET - Abnormal; Notable for the following components:      Result Value   WBC 3.3 (*)    RBC 3.51 (*)    Hemoglobin 11.2 (*)    HCT 35.0 (*)    All other components within normal limits  COMPREHENSIVE METABOLIC PANEL - Abnormal; Notable for the following components:   Sodium 131 (*)    Creatinine, Ser 1.45 (*)    Alkaline Phosphatase 32 (*)    GFR calc non Af Amer 58 (*)    All other components within normal limits  POC SARS CORONAVIRUS 2 AG -  ED - Abnormal; Notable for the following components:   SARS Coronavirus 2 Ag POSITIVE (*)    All other components within normal limits  CULTURE, BLOOD (ROUTINE X 2)  CULTURE, BLOOD (ROUTINE X 2)  URINALYSIS, ROUTINE W REFLEX MICROSCOPIC    EKG EKG  Interpretation  Date/Time:  Tuesday March 10 2019 08:41:47 EST Ventricular Rate:  74 PR Interval:    QRS Duration: 109 QT Interval:  406 QTC Calculation: 451 R Axis:   106 Text Interpretation: Sinus rhythm Right axis deviation No significant change since 7/16 Confirmed by Aletta Edouard [phone]) on 03/10/2019 8:56:59 AM   Radiology CT ABDOMEN PELVIS W CONTRAST  Result Date: 03/10/2019 CLINICAL DATA:  Flank pain EXAM: CT ABDOMEN AND PELVIS WITH CONTRAST TECHNIQUE: Multidetector CT imaging of the abdomen  and pelvis was performed using the standard protocol following bolus administration of intravenous contrast. CONTRAST:  OMNIPAQUE IOHEXOL 300 MG/ML  SOLN COMPARISON:  February 12, 2019 FINDINGS: Lower chest: There are areas of patchy somewhat nodular appearing opacity in the lung bases bilaterally. No pleural effusions evident. Hepatobiliary: No focal liver lesions are evident. The gallbladder wall is not appreciably thickened. There is no biliary duct dilatation. Pancreas: There is no pancreatic mass or inflammatory focus. Spleen: No splenic lesions are evident. Adrenals/Urinary Tract: Adrenals bilaterally appear normal. There is a cyst arising in the upper pole of the right kidney measuring 8 x 7 mm. There is a 1 x 1 cm cyst in the medial lower pole of the right kidney. There is a 4 mm probable cyst in the posterior mid left kidney. There is no perinephric stranding on each side. Kidney show symmetric enhancement bilaterally. There is an extrarenal pelvis on the right, an anatomic variant. A smaller left extrarenal pelvis noted. There is a calculus in the posterior upper pole region on the left measuring 5 x 5 mm. There is a calculus in the lower pole of the right kidney measuring 3 x 3 mm. There is a 4 x 3 mm calculus in the distal left ureter immediately proximal to the left ureterovesical junction. No other ureteral calculi are evident. Urinary bladder is midline with wall thickness normal.  Stomach/Bowel: There is no appreciable bowel wall or mesenteric thickening. No evident bowel obstruction. Terminal ileum appears normal. There is no evident free air or portal venous air. Vascular/Lymphatic: There is no abdominal aortic aneurysm. There is mild plaque in the distal aorta. There is a focal area of calcification in each common iliac artery. No adenopathy is appreciable in the abdomen or pelvis. Reproductive: Prostate and seminal vesicles are normal in size and contour. No pelvic mass. Other: There is a small amount of ascites in the dependent portion of the pelvis. Appendix appears normal. No evident abscess in the abdomen or pelvis. Musculoskeletal: There is anterior wedging of the T10 vertebral body, age uncertain but not present on study from slightly less than 1 month prior. There is degenerative change in the lower lumbar spine. No blastic or lytic bone lesions. No intramuscular lesions evident. IMPRESSION: 1. 4 x 3 mm calculus in the distal left ureter near the ureterovesical junction without appreciable hydronephrosis on the left. 2.  Nonobstructing calculi in each kidney. 3. Multifocal somewhat nodular appearing opacities in the lung bases consistent with atypical organism pneumonia. Note that COVID-19 pneumonia may present in this manner. 4. Recent fracture of the T10 vertebral body without retropulsion of bone evident. Note that this fracture was not present on recent CT examination of the abdomen and pelvis. 5. Small amount of ascites in the dependent portion the pelvis which may have reactive etiology. 6. No bowel obstruction. No abscess in the abdomen pelvis. Appendix appears normal. 7.  Aortic Atherosclerosis (ICD10-I70.0). Electronically Signed   By: Bretta Bang III M.D.   On: 03/10/2019 10:56   DG Chest Portable 1 View  Result Date: 03/10/2019 CLINICAL DATA:  COVID, syncope. EXAM: PORTABLE CHEST 1 VIEW COMPARISON:  None FINDINGS: Cardiomediastinal contours are normal. Hilar  structures are unremarkable. Lungs are clear without signs of consolidation or effusion. Visualized skeletal structures are unremarkable. IMPRESSION: No acute cardiopulmonary disease. Electronically Signed   By: Donzetta Kohut M.D.   On: 03/10/2019 10:25    Procedures Procedures (including critical care time)  Medications Ordered in ED Medications  sodium  chloride (PF) 0.9 % injection (has no administration in time range)  sodium chloride 0.9 % bolus 1,000 mL (0 mLs Intravenous Stopped 03/10/19 1052)  iohexol (OMNIPAQUE) 300 MG/ML solution 100 mL (100 mLs Intravenous Contrast Given 03/10/19 1042)    ED Course  I have reviewed the triage vital signs and the nursing notes.  Pertinent labs & imaging results that were available during my care of the patient were reviewed by me and considered in my medical decision making (see chart for details).  Clinical Course as of Mar 09 1301  Tue Mar 10, 2019  [id number] 44 year old male here after a syncopal event this morning.  He said he has had kidney stone right flank pain for almost a month.  Does not have prior history of syncope.  Afebrile here.  Soft blood pressure.  Covid testing positive.  Getting lab work urinalysis.   [MB]    Clinical Course User Index [MB] Terrilee Files, MD   MDM Rules/Calculators/A&P                      Patient presenting for evaluation after syncopal event.  History consistent with orthostatic hypotension.  Blood pressure soft upon arrival to the ER, even with fluids with EMS.  As he has no significant medical problems and without chest pain, low suspicion for ACS or cardiac cause for syncope.  Will obtain EKG, labs, chest x-ray.  Covid test positive.  Likely contributing to how he is feeling yesterday and dehydration.  CBC shows mild leukopenia at 3.3, consistent with viral illness.  As patient has vacuolated neutrophils, will order blood cultures, as pt does not appear septic at this time I do not believe he needs  admission due to this.  Case discussed with attending, Dr. Charm Barges evaluated patient and agrees to plan.  Creatinine 1.45, no recent to compare to, however when compared to 4 years ago this is similar.  Chest x-ray viewed interpreted by me, no pneumonia pneumothorax and effusion, cardiomegaly.  Urine without infection.  Orthostatics were positive in change of blood pressure, however patient remained asymptomatic.  Will obtain CT pelvis for further evaluation for possible kidney stone, as patient has no blood in his urine, but continued pain.  CT shows small kidney stone in the distal left ureter.  Also shows a T10 fracture without retropulsion.  Age indeterminate.  Patient is pain-free and neurologically intact, fracture appears stable.  Discussed with pt pain control as needed, but no emergent intervention necessary at this time.  Discussed importance of quarantine and symptomatic treatment for Covid.  Discussed importance of hydration.  Patient remains without further lightheadedness or near syncope in the ED.  I have low suspicion for emergent or life-threatening cause for syncope at this time.  Discussed with patient, who is agreeable to plan.  Discussed strict return precautions.  At this time, patient appears safe for discharge.  Patient states he understands and agrees to plan.  Final Clinical Impression(s) / ED Diagnoses Final diagnoses:  COVID-19  Orthostatic hypotension  Other closed fracture of tenth thoracic vertebra, initial encounter [facility])  Kidney stone on left side    Rx / DC Orders ED Discharge Orders    None       Alveria Apley, PA-C 03/10/19 1303    Terrilee Files, MD 03/10/19 1736

## 2019-03-10 NOTE — Discharge Instructions (Addendum)
You were found to be Covid positive today.  You will need to isolate at home until January 21. You should treat your Covid symptoms symptomatically.  Use Tylenol or ibuprofen as needed for body aches, headaches, or fever.  Make sure you are staying hydrated water.  Use Zofran as needed for nausea or vomiting. Return to emergency room if you develop severe difficulty breathing.   From your fall today you had a fracture in your back.  There is nothing to be done about this at this time.  Should be treated with pain control, and follow-up with your primary care doctor.  If you develop numbness, return to the emergency room for evaluation.  You likely passed out today due to low blood pressure.  It i it extremely important that you make sure you stay well-hydrated with water.  Be careful about using your pain medicine, as this can lower your blood pressure.  Return to the emergency room with any new, worsening, concerning symptoms.

## 2019-03-14 ENCOUNTER — Other Ambulatory Visit: Payer: Self-pay

## 2019-03-14 ENCOUNTER — Encounter (HOSPITAL_COMMUNITY): Payer: Self-pay

## 2019-03-14 ENCOUNTER — Inpatient Hospital Stay (HOSPITAL_COMMUNITY)
Admission: EM | Admit: 2019-03-14 | Discharge: 2019-03-20 | DRG: 640 | Disposition: A | Discharge status: Home Health | Payer: Federal, State, Local not specified - PPO | Attending: Internal Medicine | Admitting: Internal Medicine

## 2019-03-14 DIAGNOSIS — U071 COVID-19: Secondary | ICD-10-CM | POA: Diagnosis present

## 2019-03-14 DIAGNOSIS — J1282 Pneumonia due to coronavirus disease 2019: Secondary | ICD-10-CM | POA: Diagnosis not present

## 2019-03-14 DIAGNOSIS — Z79899 Other long term (current) drug therapy: Secondary | ICD-10-CM | POA: Diagnosis not present

## 2019-03-14 DIAGNOSIS — R339 Retention of urine, unspecified: Secondary | ICD-10-CM | POA: Diagnosis not present

## 2019-03-14 DIAGNOSIS — R5381 Other malaise: Secondary | ICD-10-CM | POA: Diagnosis present

## 2019-03-14 DIAGNOSIS — Z8249 Family history of ischemic heart disease and other diseases of the circulatory system: Secondary | ICD-10-CM

## 2019-03-14 DIAGNOSIS — R918 Other nonspecific abnormal finding of lung field: Secondary | ICD-10-CM | POA: Diagnosis not present

## 2019-03-14 DIAGNOSIS — Z87442 Personal history of urinary calculi: Secondary | ICD-10-CM

## 2019-03-14 DIAGNOSIS — R066 Hiccough: Secondary | ICD-10-CM | POA: Diagnosis not present

## 2019-03-14 DIAGNOSIS — E871 Hypo-osmolality and hyponatremia: Secondary | ICD-10-CM | POA: Diagnosis not present

## 2019-03-14 DIAGNOSIS — R29810 Facial weakness: Secondary | ICD-10-CM | POA: Diagnosis not present

## 2019-03-14 DIAGNOSIS — R531 Weakness: Secondary | ICD-10-CM | POA: Diagnosis not present

## 2019-03-14 DIAGNOSIS — R159 Full incontinence of feces: Secondary | ICD-10-CM | POA: Diagnosis present

## 2019-03-14 DIAGNOSIS — R4182 Altered mental status, unspecified: Secondary | ICD-10-CM | POA: Diagnosis not present

## 2019-03-14 DIAGNOSIS — R000 Tachycardia, unspecified: Secondary | ICD-10-CM | POA: Diagnosis not present

## 2019-03-14 NOTE — ED Triage Notes (Signed)
Pt BIB [facility] from home. Pt was seen at [facility] and tested positive for COVID on the 12th and was also diagnosed with a vertebrae fracture from multiple falls at home. Pt was discharged but since then wife does not feel like he is getting better and feels like he is altered. Pt was A&Ox4 with EMS and no neuro deficits were noted. Wife concerned pt has a concussion from falls.

## 2019-03-15 ENCOUNTER — Inpatient Hospital Stay (HOSPITAL_COMMUNITY): Payer: Federal, State, Local not specified - PPO

## 2019-03-15 ENCOUNTER — Emergency Department (HOSPITAL_COMMUNITY): Payer: Federal, State, Local not specified - PPO

## 2019-03-15 DIAGNOSIS — R918 Other nonspecific abnormal finding of lung field: Secondary | ICD-10-CM | POA: Diagnosis not present

## 2019-03-15 DIAGNOSIS — Z01110 Encounter for hearing examination following failed hearing screening: Secondary | ICD-10-CM | POA: Diagnosis not present

## 2019-03-15 DIAGNOSIS — J1282 Pneumonia due to coronavirus disease 2019: Secondary | ICD-10-CM | POA: Diagnosis not present

## 2019-03-15 DIAGNOSIS — R339 Retention of urine, unspecified: Secondary | ICD-10-CM | POA: Diagnosis present

## 2019-03-15 DIAGNOSIS — R4182 Altered mental status, unspecified: Secondary | ICD-10-CM | POA: Diagnosis not present

## 2019-03-15 DIAGNOSIS — Z79899 Other long term (current) drug therapy: Secondary | ICD-10-CM | POA: Diagnosis not present

## 2019-03-15 DIAGNOSIS — U071 COVID-19: Secondary | ICD-10-CM | POA: Diagnosis not present

## 2019-03-15 DIAGNOSIS — N132 Hydronephrosis with renal and ureteral calculous obstruction: Secondary | ICD-10-CM | POA: Diagnosis not present

## 2019-03-15 DIAGNOSIS — Z87442 Personal history of urinary calculi: Secondary | ICD-10-CM | POA: Diagnosis not present

## 2019-03-15 DIAGNOSIS — Z8249 Family history of ischemic heart disease and other diseases of the circulatory system: Secondary | ICD-10-CM | POA: Diagnosis not present

## 2019-03-15 DIAGNOSIS — E871 Hypo-osmolality and hyponatremia: Secondary | ICD-10-CM | POA: Diagnosis present

## 2019-03-15 DIAGNOSIS — R159 Full incontinence of feces: Secondary | ICD-10-CM | POA: Diagnosis present

## 2019-03-15 LAB — ACETAMINOPHEN LEVEL: Acetaminophen (Tylenol), Serum: <10 ug/mL — ABNORMAL LOW (ref 10–30)

## 2019-03-15 LAB — BASIC METABOLIC PANEL
Anion gap: 11 (ref 5–15)
Anion gap: 10 (ref 5–15)
Anion gap: 10 (ref 5–15)
Anion gap: 10 (ref 5–15)
BUN: 7 mg/dL (ref 6–20)
BUN: 6 mg/dL (ref 6–20)
BUN: 7 mg/dL (ref 6–20)
BUN: 8 mg/dL (ref 6–20)
CO2: 19 mmol/L — ABNORMAL LOW (ref 22–32)
CO2: 21 mmol/L — ABNORMAL LOW (ref 22–32)
CO2: 19 mmol/L — ABNORMAL LOW (ref 22–32)
CO2: 19 mmol/L — ABNORMAL LOW (ref 22–32)
Calcium: 8.9 mg/dL (ref 8.9–10.3)
Calcium: 9.2 mg/dL (ref 8.9–10.3)
Calcium: 8.7 mg/dL — ABNORMAL LOW (ref 8.9–10.3)
Calcium: 9.1 mg/dL (ref 8.9–10.3)
Chloride: 80 mmol/L — ABNORMAL LOW (ref 98–111)
Chloride: 80 mmol/L — ABNORMAL LOW (ref 98–111)
Chloride: 79 mmol/L — ABNORMAL LOW (ref 98–111)
Chloride: 81 mmol/L — ABNORMAL LOW (ref 98–111)
Creatinine, Ser: 0.95 mg/dL (ref 0.61–1.24)
Creatinine, Ser: 0.98 mg/dL (ref 0.61–1.24)
Creatinine, Ser: 1.01 mg/dL (ref 0.61–1.24)
Creatinine, Ser: 0.98 mg/dL (ref 0.61–1.24)
GFR calc Af Amer: >60 mL/min (ref >60)
GFR calc Af Amer: >60 mL/min (ref >60)
GFR calc Af Amer: >60 mL/min (ref >60)
GFR calc Af Amer: >60 mL/min (ref >60)
GFR calc non Af Amer: >60 mL/min (ref >60)
GFR calc non Af Amer: >60 mL/min (ref >60)
GFR calc non Af Amer: >60 mL/min (ref >60)
GFR calc non Af Amer: >60 mL/min (ref >60)
Glucose, Bld: 86 mg/dL (ref 70–99)
Glucose, Bld: 90 mg/dL (ref 70–99)
Glucose, Bld: 85 mg/dL (ref 70–99)
Glucose, Bld: 90 mg/dL (ref 70–99)
Potassium: 3.7 mmol/L (ref 3.5–5.1)
Potassium: 3.8 mmol/L (ref 3.5–5.1)
Potassium: 3.6 mmol/L (ref 3.5–5.1)
Potassium: 3.4 mmol/L — ABNORMAL LOW (ref 3.5–5.1)
Sodium: 110 mmol/L — CL (ref 135–145)
Sodium: 111 mmol/L — CL (ref 135–145)
Sodium: 108 mmol/L — CL (ref 135–145)
Sodium: 110 mmol/L — CL (ref 135–145)

## 2019-03-15 LAB — MRSA PCR SCREENING: MRSA by PCR: NEGATIVE

## 2019-03-15 LAB — CULTURE, BLOOD (ROUTINE X 2)
Culture: NO GROWTH
Culture: NO GROWTH
Special Requests: ADEQUATE
Special Requests: ADEQUATE

## 2019-03-15 LAB — COMPREHENSIVE METABOLIC PANEL
ALT: 27 U/L (ref 0–44)
AST: 74 U/L — ABNORMAL HIGH (ref 15–41)
Albumin: 4.3 g/dL (ref 3.5–5.0)
Alkaline Phosphatase: 46 U/L (ref 38–126)
Anion gap: 12 (ref 5–15)
BUN: 7 mg/dL (ref 6–20)
CO2: 20 mmol/L — ABNORMAL LOW (ref 22–32)
Calcium: 9 mg/dL (ref 8.9–10.3)
Chloride: 76 mmol/L — ABNORMAL LOW (ref 98–111)
Creatinine, Ser: 1.01 mg/dL (ref 0.61–1.24)
GFR calc Af Amer: >60 mL/min (ref >60)
GFR calc non Af Amer: >60 mL/min (ref >60)
Glucose, Bld: 96 mg/dL (ref 70–99)
Potassium: 3.6 mmol/L (ref 3.5–5.1)
Sodium: 108 mmol/L — CL (ref 135–145)
Total Bilirubin: 1 mg/dL (ref 0.3–1.2)
Total Protein: 7.1 g/dL (ref 6.5–8.1)

## 2019-03-15 LAB — CBC WITH DIFFERENTIAL/PLATELET
Abs Immature Granulocytes: 0 10*3/uL (ref 0.00–0.07)
Basophils Absolute: 0 10*3/uL (ref 0.0–0.1)
Basophils Relative: 0 %
Eosinophils Absolute: 0 10*3/uL (ref 0.0–0.5)
Eosinophils Relative: 0 %
HCT: 35.7 % — ABNORMAL LOW (ref 39.0–52.0)
Hemoglobin: 13.4 g/dL (ref 13.0–17.0)
Immature Granulocytes: 0 %
Lymphocytes Relative: 23 %
Lymphs Abs: 0.5 10*3/uL — ABNORMAL LOW (ref 0.7–4.0)
MCH: 31.5 pg (ref 26.0–34.0)
MCHC: 37.5 g/dL — ABNORMAL HIGH (ref 30.0–36.0)
MCV: 83.8 fL (ref 80.0–100.0)
Monocytes Absolute: 0.1 10*3/uL (ref 0.1–1.0)
Monocytes Relative: 3 %
Neutro Abs: 1.7 10*3/uL (ref 1.7–7.7)
Neutrophils Relative %: 74 %
Platelets: 99 10*3/uL — ABNORMAL LOW (ref 150–400)
RBC: 4.26 MIL/uL (ref 4.22–5.81)
RDW: 11.1 % — ABNORMAL LOW (ref 11.5–15.5)
WBC: 2.4 10*3/uL — ABNORMAL LOW (ref 4.0–10.5)
nRBC: 0 % (ref 0.0–0.2)

## 2019-03-15 LAB — ETHANOL: Alcohol, Ethyl (B): <10 mg/dL (ref <10)

## 2019-03-15 LAB — CBC
HCT: 35.2 % — ABNORMAL LOW (ref 39.0–52.0)
Hemoglobin: 12.9 g/dL — ABNORMAL LOW (ref 13.0–17.0)
MCH: 31.3 pg (ref 26.0–34.0)
MCHC: 36.6 g/dL — ABNORMAL HIGH (ref 30.0–36.0)
MCV: 85.4 fL (ref 80.0–100.0)
Platelets: 112 10*3/uL — ABNORMAL LOW (ref 150–400)
RBC: 4.12 MIL/uL — ABNORMAL LOW (ref 4.22–5.81)
RDW: 11.1 % — ABNORMAL LOW (ref 11.5–15.5)
WBC: 1.7 10*3/uL — ABNORMAL LOW (ref 4.0–10.5)
nRBC: 0 % (ref 0.0–0.2)

## 2019-03-15 LAB — CBG MONITORING, ED: Glucose-Capillary: 90 mg/dL (ref 70–99)

## 2019-03-15 LAB — URINALYSIS, ROUTINE W REFLEX MICROSCOPIC
Bilirubin Urine: NEGATIVE
Glucose, UA: NEGATIVE mg/dL
Hgb urine dipstick: NEGATIVE
Ketones, ur: 20 mg/dL — AB
Leukocytes,Ua: NEGATIVE
Nitrite: NEGATIVE
Protein, ur: 100 mg/dL — AB
Specific Gravity, Urine: 1.016 (ref 1.005–1.030)
pH: 6 (ref 5.0–8.0)

## 2019-03-15 LAB — RAPID URINE DRUG SCREEN, HOSP PERFORMED
Amphetamines: NOT DETECTED
Barbiturates: NOT DETECTED
Benzodiazepines: NOT DETECTED
Cocaine: NOT DETECTED
Opiates: NOT DETECTED
Tetrahydrocannabinol: NOT DETECTED

## 2019-03-15 LAB — T4, FREE: Free T4: <0.25 ng/dL — ABNORMAL LOW (ref 0.61–1.12)

## 2019-03-15 LAB — TSH: TSH: 0.573 u[IU]/mL (ref 0.350–4.500)

## 2019-03-15 LAB — NA AND K (SODIUM & POTASSIUM), RAND UR
Potassium Urine: 34 mmol/L
Sodium, Ur: 49 mmol/L

## 2019-03-15 LAB — OSMOLALITY, URINE: Osmolality, Ur: 537 mOsm/kg (ref 300–900)

## 2019-03-15 LAB — SALICYLATE LEVEL: Salicylate Lvl: <7 mg/dL — ABNORMAL LOW (ref 7.0–30.0)

## 2019-03-15 LAB — LACTATE DEHYDROGENASE: LDH: 389 U/L — ABNORMAL HIGH (ref 98–192)

## 2019-03-15 LAB — D-DIMER, QUANTITATIVE: D-Dimer, Quant: 2.58 ug/mL-FEU — ABNORMAL HIGH (ref 0.00–0.50)

## 2019-03-15 LAB — FIBRINOGEN: Fibrinogen: 416 mg/dL (ref 210–475)

## 2019-03-15 LAB — C-REACTIVE PROTEIN: CRP: 7.1 mg/dL — ABNORMAL HIGH (ref <1.0)

## 2019-03-15 LAB — HIV ANTIBODY (ROUTINE TESTING W REFLEX): HIV Screen 4th Generation wRfx: NONREACTIVE

## 2019-03-15 LAB — OSMOLALITY: Osmolality: 229 mOsm/kg — CL (ref 275–295)

## 2019-03-15 LAB — CORTISOL: Cortisol, Plasma: 14.3 ug/dL

## 2019-03-15 LAB — AMMONIA: Ammonia: 17 umol/L (ref 9–35)

## 2019-03-15 MED ORDER — ACETAMINOPHEN 325 MG PO TABS
650.0000 mg | ORAL_TABLET | Freq: Once | ORAL | Status: AC
  Administered 2019-03-15: 650 mg via ORAL
  Filled 2019-03-15: qty 2

## 2019-03-15 MED ORDER — TAMSULOSIN HCL 0.4 MG PO CAPS
0.4000 mg | ORAL_CAPSULE | Freq: Every day | ORAL | Status: DC
  Administered 2019-03-15 – 2019-03-20 (×6): 0.4 mg via ORAL
  Filled 2019-03-15 (×6): qty 1

## 2019-03-15 MED ORDER — CHLORHEXIDINE GLUCONATE CLOTH 2 % EX PADS
6.0000 | MEDICATED_PAD | Freq: Every day | CUTANEOUS | Status: DC
  Administered 2019-03-16 – 2019-03-17 (×2): 6 via TOPICAL

## 2019-03-15 MED ORDER — SODIUM CHLORIDE 0.9% FLUSH
3.0000 mL | Freq: Once | INTRAVENOUS | Status: AC
  Administered 2019-03-15: 3 mL via INTRAVENOUS

## 2019-03-15 MED ORDER — ENOXAPARIN SODIUM 40 MG/0.4ML ~~LOC~~ SOLN
40.0000 mg | SUBCUTANEOUS | Status: DC
  Administered 2019-03-15: 40 mg via SUBCUTANEOUS
  Filled 2019-03-15: qty 0.4

## 2019-03-15 MED ORDER — SODIUM CHLORIDE 0.9 % IV SOLN: INTRAVENOUS | Status: DC

## 2019-03-15 MED ORDER — DEXAMETHASONE SODIUM PHOSPHATE 10 MG/ML IJ SOLN: 6.0000 mg | INTRAMUSCULAR | Status: DC

## 2019-03-15 MED ORDER — ENOXAPARIN SODIUM 40 MG/0.4ML ~~LOC~~ SOLN
40.0000 mg | Freq: Two times a day (BID) | SUBCUTANEOUS | Status: DC
  Administered 2019-03-15 – 2019-03-16 (×3): 40 mg via SUBCUTANEOUS
  Filled 2019-03-15 (×3): qty 0.4

## 2019-03-15 MED ORDER — ACETAMINOPHEN 500 MG PO TABS
500.0000 mg | ORAL_TABLET | Freq: Four times a day (QID) | ORAL | Status: DC | PRN
  Administered 2019-03-17 – 2019-03-18 (×2): 500 mg via ORAL
  Filled 2019-03-15 (×2): qty 1

## 2019-03-15 MED ORDER — ONDANSETRON HCL 4 MG/2ML IJ SOLN
4.0000 mg | Freq: Four times a day (QID) | INTRAMUSCULAR | Status: DC | PRN
  Administered 2019-03-15: 4 mg via INTRAVENOUS
  Filled 2019-03-15: qty 2

## 2019-03-15 MED ORDER — SODIUM CHLORIDE 0.9 % IV BOLUS (SEPSIS)
500.0000 mL | Freq: Once | INTRAVENOUS | Status: AC
  Administered 2019-03-15: 500 mL via INTRAVENOUS

## 2019-03-15 NOTE — Progress Notes (Signed)
CRITICAL VALUE ALERT  Critical Value:  Na 108   Date & Time Notied:  03/15/2019 @ 18:15  Provider Notified: Laural Benes.  Orders Received/Actions taken: TBD

## 2019-03-15 NOTE — ED Notes (Signed)
Patient transported to CT 

## 2019-03-15 NOTE — ED Notes (Signed)
Pt had incontinent episode of stool. Cleaned up and bed changed.

## 2019-03-15 NOTE — ED Notes (Signed)
Bladder scan 490 mL

## 2019-03-15 NOTE — ED Notes (Signed)
Pt able to urinate. approx of urine in leg bag.

## 2019-03-15 NOTE — Progress Notes (Signed)
CRITICAL VALUE ALERT  Critical Value:  Na 110  Date & Time Notied:  03/15/2019 @2322   Provider Notified: MD  Orders Received/Actions taken: TBD

## 2019-03-15 NOTE — H&P (Signed)
NAME:  Christopher Lara, MRN:  [id number], DOB:  1975-09-14, LOS: 0 ADMISSION DATE:  03/14/2019, CONSULTATION DATE:  03/15/19 REFERRING MD: Lilian Kapur , CHIEF COMPLAINT:  confusion  Brief History   44 year old male who presents with Covid diagnosis on 1/12 and severe hyponatremia of 108 likely secondary to poor p.o. intake.  History of present illness   44 year old male with a history of kidney stones who was brought in to the ED for several days of confusion.  He was diagnosed with Covid-19 on 1/12 when he presented for a fall and syncopal episode since then patient has been fatigued mostly in bed with poor p.o. intake and sodium found to be 108.  Patient is slow to answer questions, but oriented to person and place and reports fever and some nausea at home without cough, chest pain or tightness, abdominal pain, diarrhea or abnormally elevated urine output.  He states when he was first diagnosed with Covid-19, he was drinking about 5 small bottles of water or Gatorade in a day then decreased in the last couple of days.   ED work-up was significant for chest x-ray with bilateral patchy infiltrates without oxygen requirement, leukopenia with WBC 2.4, sodium 108 with normal glucose.  Head CT was negative for acute findings. patient was given 500 cc bolus of normal saline and PCCM consulted for admission.  Past Medical History  Kidney stones  Significant Hospital Events   1/17-admit to PCCM  Consults:    Procedures:    Significant Diagnostic Tests:  1/17 chest x-ray>>Patchy multifocal airspace opacities involving the mid and lower lungs, consistent with multifocal pneumonia 1/17 head CT>> no acute findings  Micro Data:    Antimicrobials:    Interim history/subjective:  Patient seems more interactive than reported earlier in ED course, no seizure activity  Objective   Blood pressure 133/90, pulse 75, temperature (!) 100.6 F (38.1 C), temperature source Rectal, resp. rate (!) 25,  SpO2 100 %.       No intake or output data in the 24 hours ending 03/15/19 0615 There were no vitals filed for this visit.  General:  Well-nourished M in no acute distress HEENT: MM pink/moist Neuro: Awake, alert oriented to person and place, odd affect and slow to answer questions, moving all extremities with no focal deficits  CV: s1s2 regular rate and rhythm, no m/r/g PULM: Decreased breath sounds bilateral bases GI: soft, bsx4 active  Extremities: warm/dry, no edema  Skin: no rashes or lesions   Resolved Hospital Problem list     Assessment & Plan:    Hyponatremia -Chronic as was hyponatremic with Na 131 5 days ago and acutely worsening -Bedside ultrasound with IVC approximately 50% collapsible -Consider the etiology poor p.o. intake/volume depletion, patient had no significant urine output during ED course, did have one episode incontinent of stool -Could also consider SIADH after syncopal episode if he hit his head and has mild TBI -No reported alcohol use, unable to reach wife for supplemental history P: -Received 500 cc normal saline bolus, await repeat sodium level before repeating any further -Goal to raise Na 6-41meqs in 24 hours -Check urine analysis, urine osms, serum creatinine, TSH and T4 and cortisol -Continue neurochecks   Covid-19 pneumonia -Patient is comfortable on room air and diagnosed 5 days ago P: -Check inflammatory labs but hold on dexamethasone/remdesivir given no oxygen requirement  Best practice:  Diet: Regular Pain/Anxiety/Delirium protocol (if indicated): N/A VAP protocol (if indicated): N/A DVT prophylaxis: Lovenox GI prophylaxis: N/A Glucose control:  N/A Mobility: With assist Code Status: Full code Family Communication: Attempted to call wife [name] without answer Disposition: ICU  Labs   CBC: Recent Labs  Lab 03/10/19 0845 03/15/19 0149  WBC 3.3* 2.4*  NEUTROABS 2.3 1.7  HGB 11.2* 13.4  HCT 35.0* 35.7*  MCV 99.7 83.8   PLT PLATELET CLUMPS NOTED ON SMEAR, UNABLE TO ESTIMATE 99*    Basic Metabolic Panel: Recent Labs  Lab 03/10/19 0845 03/15/19 0149  NA 131* 108*  K 4.2 3.6  CL 100 76*  CO2 24 20*  GLUCOSE 90 96  BUN 13 7  CREATININE 1.45* 1.01  CALCIUM 9.5 9.0   GFR: CrCl cannot be calculated (Unknown ideal weight.). Recent Labs  Lab 03/10/19 0845 03/15/19 0149  WBC 3.3* 2.4*    Liver Function Tests: Recent Labs  Lab 03/10/19 0845 03/15/19 0149  AST 41 74*  ALT 28 27  ALKPHOS 32* 46  BILITOT 0.9 1.0  PROT 6.5 7.1  ALBUMIN 4.0 4.3   No results for input(s): LIPASE, AMYLASE in the last 168 hours. Recent Labs  Lab 03/15/19 0149  AMMONIA 17    ABG No results found for: PHART, PCO2ART, PO2ART, HCO3, TCO2, ACIDBASEDEF, O2SAT   Coagulation Profile: No results for input(s): INR, PROTIME in the last 168 hours.  Cardiac Enzymes: No results for input(s): CKTOTAL, CKMB, CKMBINDEX, TROPONINI in the last 168 hours.  HbA1C: No results found for: HGBA1C  CBG: Recent Labs  Lab 03/15/19 0031  GLUCAP 90    Review of Systems:     Past Medical History  He,  has a past medical history of Kidney stone, Kidney stone, Kidney stones, and Kidney stones.   Surgical History   History reviewed. No pertinent surgical history.   Social History   reports that he has never smoked. He has never used smokeless tobacco. He reports that he does not drink alcohol or use drugs.   Family History   His family history includes Cancer in his brother; Heart failure in his father.   Allergies No Known Allergies   Home Medications  Prior to Admission medications   Medication Sig Start Date End Date Taking? Authorizing Provider  acetaminophen (TYLENOL) 500 MG tablet Take 500 mg by mouth every 6 (six) hours as needed for headache.   Yes [provider]  ondansetron (ZOFRAN) 4 MG tablet Take 4 mg by mouth 3 (three) times daily as needed. 02/12/19  Yes [provider]   oxyCODONE-acetaminophen (PERCOCET/ROXICET) 5-325 MG tablet Take 1 tablet by mouth daily as needed for moderate pain.  02/12/19  Yes [provider]  tamsulosin (FLOMAX) 0.4 MG CAPS capsule Take 0.4 mg by mouth daily. 02/12/19  Yes [provider]  omeprazole (PRILOSEC) 20 MG capsule Take 1 capsule (20 mg total) by mouth daily. 30 minutes prior to meal. Patient not taking: Reported on 10/01/2014 09/21/14 11/06/14  Ladona Mow, PA-C     Critical care time: 62 minutes   CRITICAL CARE Performed by: Darcella Gasman Avedis Bevis   Total critical care time: 62 minutes  Critical care time was exclusive of separately billable procedures and treating other patients.  Critical care was necessary to treat or prevent imminent or life-threatening deterioration.  Critical care was time spent personally by me on the following activities: development of treatment plan with patient and/or surrogate as well as nursing, discussions with consultants, evaluation of patient's response to treatment, examination of patient, obtaining history from patient or surrogate, ordering and performing treatments and interventions, ordering  and review of laboratory studies, ordering and review of radiographic studies, pulse oximetry and re-evaluation of patient's condition.   Otilio Carpen Raiden Haydu, PA-C [facility]  PCCM  [phone]

## 2019-03-15 NOTE — ED Provider Notes (Signed)
Patient seen/examined in the Emergency Department in conjunction with Advanced Practice Provider McDonald Patient with recent diagnosis of COVID-19.  Now presenting with falls and confusion Exam : Patient is awake alert but appears confused.  He does follow commands, he moves all extremities x4 Plan: Patient with Covid pneumonia but also has severe hyponatremia with sodium 108 I discussed the case with critical care who will evaluate patient for admission. Patient has not had any seizures and is awake/alert this time    Zadie Rhine, MD 03/15/19 [phone]

## 2019-03-15 NOTE — ED Notes (Signed)
Placed condom cath on patient.

## 2019-03-15 NOTE — ED Notes (Signed)
Christopher Lara wife [phone] looking for an update

## 2019-03-15 NOTE — ED Notes (Signed)
Pt had 2 episodes of emesis. Emesis was clear.

## 2019-03-15 NOTE — ED Provider Notes (Signed)
[facility] EMERGENCY DEPARTMENT Provider Note   CSN: [id number] Arrival date & time: 03/14/19  2338     History Chief Complaint  Patient presents with  . Altered Mental Status    Christopher Lara is a 44 y.o. male with a history of kidney stones brought to the ER by EMS for altered mental status.  Patient is able to correctly state his first and last name, birthday, city and state, and knows that he is at a hospital.  Patient takes a lengthy amount of time before he is able to respond.  He does think that the president is [employer].  He is unable to provide any further information about why he is at the ER.  He is randomly stating multiple numbers out loud.  Spoke with the patient's wife, Christopher Lara.  Reports that she called EMS tonight because the patient has been confused for the last 3 days.   She reports that intermittently he will answer questions appropriately, but other times he will just state random numbers.  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 She reports that confusion has been constant and has not seemed to wax and wane.  She is a somewhat poor historian.  She reports that he has fallen twice in the last few days.  She knows that 1 day that he fell backwards and hit his head in the shower.  She reports that the other fall involved him falling out of bed.  She is unsure what dates these occurred.  Per chart review, the patient was evaluated in the ER on January 12 for a syncopal episode that resulted in him falling after he got out of bed.  He had a CT abdomen pelvis that showed a small kidney stone in the left distal ureter.  A T10 fracture that was age-indeterminate was also noted on CT.  He had a leukopenia and evacuated neutrophils.  He noted that his wife was being tested for Covid due to multiple sick contacts at work.  He had a positive COVID-19 test at the ER  visit.  The patient's wife reports poor p.o. intake over the last few days.  She reports that he has barely gotten out of bed since he became confused 3 days ago.  She has not noted him to be short of breath.  No known fevers or chills.   She reports that he does not drink alcohol.  No illicit or recreational drug use.  Level 5 caveat secondary to altered mental status.  The history is provided by the patient. No language interpreter was used.       Past Medical History:  Diagnosis Date  . Kidney stone   . Kidney stone   . Kidney stones   . Kidney stones     There are no problems to display for this patient.   History reviewed. No pertinent surgical history.     Family History  Problem Relation Age of Onset  . Heart failure Father   . Cancer Brother     Social History   Tobacco Use  . Smoking status: Never Smoker  . Smokeless tobacco: Never Used  Substance Use Topics  . Alcohol use: No  . Drug use: No    Home Medications Prior to Admission medications   Medication Sig Start Date End Date Taking? Authorizing Provider  acetaminophen (TYLENOL) 500 MG tablet Take  500 mg by mouth every 6 (six) hours as needed for headache.   Yes [provider]  ondansetron (ZOFRAN) 4 MG tablet Take 4 mg by mouth 3 (three) times daily as needed. 02/12/19  Yes [provider]  oxyCODONE-acetaminophen (PERCOCET/ROXICET) 5-325 MG tablet Take 1 tablet by mouth daily as needed for moderate pain.  02/12/19  Yes [provider]  tamsulosin (FLOMAX) 0.4 MG CAPS capsule Take 0.4 mg by mouth daily. 02/12/19  Yes [provider]  omeprazole (PRILOSEC) 20 MG capsule Take 1 capsule (20 mg total) by mouth daily. 30 minutes prior to meal. Patient not taking: Reported on 10/01/2014 09/21/14 11/06/14  Ladona Mow, PA-C    Allergies    Patient has no known allergies.  Review of Systems   Review of Systems  Unable to perform ROS: Mental status change    Physical  Exam Updated Vital Signs BP 111/88   Pulse 64   Temp (!) 100.6 F (38.1 C) (Rectal)   Resp (!) 24   SpO2 96%   Physical Exam Vitals and nursing note reviewed.  Constitutional:      General: He is not in acute distress.    Appearance: He is well-developed. He is not ill-appearing, toxic-appearing or diaphoretic.  HENT:     Head: Normocephalic.  Eyes:     Conjunctiva/sclera: Conjunctivae normal.  Cardiovascular:     Rate and Rhythm: Normal rate and regular rhythm.     Pulses: Normal pulses.     Heart sounds: Normal heart sounds. No murmur. No friction rub. No gallop.   Pulmonary:     Effort: Pulmonary effort is normal. No respiratory distress.     Breath sounds: No stridor. No wheezing, rhonchi or rales.  Chest:     Chest wall: No tenderness.  Abdominal:     General: There is no distension.     Palpations: Abdomen is soft. There is no mass.     Tenderness: There is no abdominal tenderness. There is no right CVA tenderness, left CVA tenderness, guarding or rebound.     Hernia: No hernia is present.  Musculoskeletal:     Cervical back: Neck supple.  Skin:    General: Skin is warm and dry.     Capillary Refill: Capillary refill takes less than 2 seconds.     Coloration: Skin is pale.  Neurological:     Mental Status: He is alert.     Comments: Oriented to name, city, state, and year with prompting.  Initially stated random numbers when asked a year, but then answered correctly.  He thinks the president is [employer].  He knows that he is at a hospital, but does not know the name.  He is slow to respond and appears very confused.  Moves all 4 extremities spontaneously.  Good strength against resistance of the bilateral upper and lower extremities.  Follows simple commands intermittently.  No facial drooping.  Speech is not slurred.  Psychiatric:        Behavior: Behavior normal.     ED Results / Procedures / Treatments   Labs (all labs ordered are listed, but only  abnormal results are displayed) Labs Reviewed  COMPREHENSIVE METABOLIC PANEL - Abnormal; Notable for the following components:      Result Value   Sodium 108 (*)    Chloride 76 (*)    CO2 20 (*)    AST 74 (*)    All other components within normal limits  CBC WITH DIFFERENTIAL/PLATELET -  Abnormal; Notable for the following components:   WBC 2.4 (*)    HCT 35.7 (*)    MCHC 37.5 (*)    RDW 11.1 (*)    Platelets 99 (*)    Lymphs Abs 0.5 (*)    All other components within normal limits  SALICYLATE LEVEL - Abnormal; Notable for the following components:   Salicylate Lvl <7.0 (*)    All other components within normal limits  ACETAMINOPHEN LEVEL - Abnormal; Notable for the following components:   Acetaminophen (Tylenol), Serum <10 (*)    All other components within normal limits  AMMONIA  ETHANOL  RAPID URINE DRUG SCREEN, HOSP PERFORMED  URINALYSIS, ROUTINE W REFLEX MICROSCOPIC  CBG MONITORING, ED    EKG EKG Interpretation  Date/Time:  Saturday March 14 2019 23:39:05 EST Ventricular Rate:  71 PR Interval:    QRS Duration: 126 QT Interval:  434 QTC Calculation: 472 R Axis:   124 Text Interpretation: Sinus rhythm Nonspecific intraventricular conduction delay No significant change since last tracing Confirmed by Zadie Rhine ([id number]) on 03/14/2019 11:50:58 PM   Radiology CT Head Wo Contrast  Result Date: 03/15/2019 CLINICAL DATA:  Multiple recent falls. Altered mental status. COVID-19 positive. EXAM: CT HEAD WITHOUT CONTRAST TECHNIQUE: Contiguous axial images were obtained from the base of the skull through the vertex without intravenous contrast. COMPARISON:  CT head dated Jul 09, 2010. FINDINGS: Brain: No evidence of acute infarction, hemorrhage, hydrocephalus, extra-axial collection or mass lesion/mass effect. Vascular: No hyperdense vessel or unexpected calcification. Skull: Normal. Negative for fracture or focal lesion. Sinuses/Orbits: No acute finding. Other: None.  IMPRESSION: No acute intracranial abnormality. Electronically Signed   By: Katherine Mantle M.D.   On: 03/15/2019 01:35   DG Chest Portable 1 View  Result Date: 03/15/2019 CLINICAL DATA:  Initial evaluation for acute altered mental status, known COVID infection. EXAM: PORTABLE CHEST 1 VIEW COMPARISON:  Prior radiograph from 03/10/2019. FINDINGS: Transverse heart size within normal limits. Mediastinal silhouette normal. Lungs are hypoinflated. Patchy multifocal opacities involving the mid and lower lungs bilaterally, consistent with multifocal infection. No edema or effusion. No pneumothorax. No acute osseous finding. IMPRESSION: 1. Patchy multifocal airspace opacities involving the mid and lower lungs, consistent with multifocal pneumonia, presumably related to history of COVID infection. 2. No other active cardiopulmonary disease. Electronically Signed   By: Rise Mu M.D.   On: 03/15/2019 01:17    Procedures .Critical Care Performed by: Barkley Boards, PA-C Authorized by: Barkley Boards, PA-C   Critical care provider statement:    Critical care time (minutes):  45   Critical care time was exclusive of:  Separately billable procedures and treating other patients and teaching time   Critical care was necessary to treat or prevent imminent or life-threatening deterioration of the following conditions:  Metabolic crisis   Critical care was time spent personally by me on the following activities:  Ordering and performing treatments and interventions, ordering and review of laboratory studies, ordering and review of radiographic studies, pulse oximetry, re-evaluation of patient's condition, review of old charts, obtaining history from patient or surrogate, examination of patient, evaluation of patient's response to treatment, discussions with consultants and development of treatment plan with patient or surrogate   (including critical care time)  Medications Ordered in ED Medications    sodium chloride flush (NS) 0.9 % injection 3 mL (3 mLs Intravenous Given 03/15/19 0148)  acetaminophen (TYLENOL) tablet 650 mg (650 mg Oral Given 03/15/19 0147)  sodium chloride 0.9 % bolus  500 mL (0 mLs Intravenous Stopped 03/15/19 0501)    ED Course  I have reviewed the triage vital signs and the nursing notes.  Pertinent labs & imaging results that were available during my care of the patient were reviewed by me and considered in my medical decision making (see chart for details).  Clinical Course as of Mar 14 506  Nancy Fetter Mar 15, 2019  0005 Attempted contact of the patient's spouse x1 with no answer.   [MM]  0053 Attempted to contact the patient's spouse for a second time for collateral information.   [MM]  0202 Spoke with the patient's wife. He's fallen twice. "incoherent" Keeps saying "I'm at work." Confused for 2 days. [place] two days ago and three days ago. [place] and hit his head in the shower. 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    [MM]    Clinical Course User Index [MM] Orrin Yurkovich, Laymond Purser, PA-C   MDM Rules/Calculators/A&P                      Final Clinical Impression(s) / ED Diagnoses Final diagnoses:  Hyponatremia  COVID-33    44 year old male with a history of kidney stones presenting from home with altered mental status.  History is limited and wife is a poor historian, but reports confusion for the last 3 days.  Per chart review, he tested positive for COVID-19 at [facility], ER on January 12 after he was seen for a syncopal episode and a fall.  He also had a kidney stone noted in the distal ureter.  The patient was seen and adequately evaluated by Dr. Christy Gentles, attending physician.  Rectal temp 100.6 on arrival.  Vital signs are otherwise unremarkable.  Patient is oriented and alert, but globally appears confused.  No obvious focal neurologic deficits.  Patient's sodium is 108 today, down from 131 five days ago.  No history of similar.  Given critical value, he will  require admission.  He does have a leukopenia, likely secondary to COVID-19.  Chest x-ray is consistent with pneumonia, likely from COVID-19.  Component of the patient's confusion could be delirium secondary to COVID-19.  He does not appear to have increased work of breathing and has not been hypoxic in the ER.  Dr. Christy Gentles consulted critical care and spoke with Dr. Lucile Shutters, critical care, who will accept the patient for admission.  Rx / DC Orders ED Discharge Orders    None       Joanne Gavel, PA-C 03/15/19 0514    Ripley Fraise, MD 03/15/19 [phone]

## 2019-03-15 NOTE — ED Notes (Signed)
CCM at bedside 

## 2019-03-16 DIAGNOSIS — U071 COVID-19: Secondary | ICD-10-CM

## 2019-03-16 DIAGNOSIS — J1282 Pneumonia due to coronavirus disease 2019: Secondary | ICD-10-CM

## 2019-03-16 LAB — BASIC METABOLIC PANEL
Anion gap: 11 (ref 5–15)
Anion gap: 12 (ref 5–15)
Anion gap: 11 (ref 5–15)
Anion gap: 10 (ref 5–15)
Anion gap: 9 (ref 5–15)
BUN: 6 mg/dL (ref 6–20)
BUN: 7 mg/dL (ref 6–20)
BUN: 6 mg/dL (ref 6–20)
BUN: 6 mg/dL (ref 6–20)
BUN: 8 mg/dL (ref 6–20)
CO2: 20 mmol/L — ABNORMAL LOW (ref 22–32)
CO2: 18 mmol/L — ABNORMAL LOW (ref 22–32)
CO2: 20 mmol/L — ABNORMAL LOW (ref 22–32)
CO2: 21 mmol/L — ABNORMAL LOW (ref 22–32)
CO2: 21 mmol/L — ABNORMAL LOW (ref 22–32)
Calcium: 8.9 mg/dL (ref 8.9–10.3)
Calcium: 8.8 mg/dL — ABNORMAL LOW (ref 8.9–10.3)
Calcium: 8.7 mg/dL — ABNORMAL LOW (ref 8.9–10.3)
Calcium: 8.3 mg/dL — ABNORMAL LOW (ref 8.9–10.3)
Calcium: 8.8 mg/dL — ABNORMAL LOW (ref 8.9–10.3)
Chloride: 81 mmol/L — ABNORMAL LOW (ref 98–111)
Chloride: 82 mmol/L — ABNORMAL LOW (ref 98–111)
Chloride: 84 mmol/L — ABNORMAL LOW (ref 98–111)
Chloride: 84 mmol/L — ABNORMAL LOW (ref 98–111)
Chloride: 86 mmol/L — ABNORMAL LOW (ref 98–111)
Creatinine, Ser: 1 mg/dL (ref 0.61–1.24)
Creatinine, Ser: 1.03 mg/dL (ref 0.61–1.24)
Creatinine, Ser: 1.17 mg/dL (ref 0.61–1.24)
Creatinine, Ser: 1.25 mg/dL — ABNORMAL HIGH (ref 0.61–1.24)
Creatinine, Ser: 1.31 mg/dL — ABNORMAL HIGH (ref 0.61–1.24)
GFR calc Af Amer: >60 mL/min (ref >60)
GFR calc Af Amer: >60 mL/min (ref >60)
GFR calc Af Amer: >60 mL/min (ref >60)
GFR calc Af Amer: >60 mL/min (ref >60)
GFR calc Af Amer: >60 mL/min (ref >60)
GFR calc non Af Amer: >60 mL/min (ref >60)
GFR calc non Af Amer: >60 mL/min (ref >60)
GFR calc non Af Amer: >60 mL/min (ref >60)
GFR calc non Af Amer: >60 mL/min (ref >60)
GFR calc non Af Amer: >60 mL/min (ref >60)
Glucose, Bld: 79 mg/dL (ref 70–99)
Glucose, Bld: 76 mg/dL (ref 70–99)
Glucose, Bld: 79 mg/dL (ref 70–99)
Glucose, Bld: 85 mg/dL (ref 70–99)
Glucose, Bld: 85 mg/dL (ref 70–99)
Potassium: 3.5 mmol/L (ref 3.5–5.1)
Potassium: 3.7 mmol/L (ref 3.5–5.1)
Potassium: 3.5 mmol/L (ref 3.5–5.1)
Potassium: 3.2 mmol/L — ABNORMAL LOW (ref 3.5–5.1)
Potassium: 3.2 mmol/L — ABNORMAL LOW (ref 3.5–5.1)
Sodium: 112 mmol/L — CL (ref 135–145)
Sodium: 112 mmol/L — CL (ref 135–145)
Sodium: 115 mmol/L — CL (ref 135–145)
Sodium: 115 mmol/L — CL (ref 135–145)
Sodium: 116 mmol/L — CL (ref 135–145)

## 2019-03-16 LAB — URIC ACID: Uric Acid, Serum: 3.2 mg/dL — ABNORMAL LOW (ref 3.7–8.6)

## 2019-03-16 MED ORDER — SODIUM CHLORIDE 0.9 % IV SOLN: INTRAVENOUS | Status: DC

## 2019-03-16 NOTE — Progress Notes (Signed)
eLink Physician-Brief Progress Note Patient Name: Christopher Lara DOB: 1975/07/10 MRN: [id number]   Date of Service  03/16/2019  HPI/Events of Note  Hyponatremia - Na+ = 115 --> 116.   eICU Interventions  Continue present management.      Intervention Category Major Interventions: Electrolyte abnormality - evaluation and management  Recardo Linn Eugene 03/16/2019, 9:05 PM

## 2019-03-16 NOTE — Progress Notes (Signed)
NAME:  Christopher Lara, MRN:  [id number], DOB:  October 03, 1975, LOS: 1 ADMISSION DATE:  03/14/2019, CONSULTATION DATE: 03/15/2019 REFERRING MD: Sherryle Lis , CHIEF COMPLAINT:  confusion  Brief History   44 year old male who presents with Covid diagnosis on 1/12 and severe hyponatremia of 108 likely secondary to poor p.o. intake.  History of present illness   44 year old male with a history of kidney stones who was brought in to the ED for several days of confusion.  He was diagnosed with Covid-19 on 1/12 when he presented for a fall and syncopal episode since then patient has been fatigued mostly in bed with poor p.o. intake and sodium found to be 108.  Patient is slow to answer questions, but oriented to person and place and reports fever and some nausea at home without cough, chest pain or tightness, abdominal pain, diarrhea or abnormally elevated urine output.  He states when he was first diagnosed with Covid-19, he was drinking about 5 small bottles of water or Gatorade in a day then decreased in the last couple of days.   ED work-up was significant for chest x-ray with bilateral patchy infiltrates without oxygen requirement, leukopenia with WBC 2.4, sodium 108 with normal glucose.  Head CT was negative for acute findings. patient was given 500 cc bolus of normal saline and PCCM consulted for admission.  Past Medical History  Kidney stones  Significant Hospital Events   1/17-admit to PCCM  Consults:  PCCM  Procedures:  None  Significant Diagnostic Tests:  1/17 chest x-ray>>Patchy multifocal airspace opacities involving the mid and lower lungs, consistent with multifocal pneumonia 1/17 head CT>> no acute findings  Micro Data:  03/10/2019-blood cultures negative 03/15/2019-MRSA PCR negative Coronavirus positive 03/10/2019 Antimicrobials:  Remdesivir  Interim history/subjective:  Appropriately interactive Fatigue  Objective   Blood pressure 113/73, pulse 78, temperature 99 F (37.2  C), temperature source Oral, resp. rate (!) 21, weight 77.4 kg, SpO2 97 %.        Intake/Output Summary (Last 24 hours) at 03/16/2019 1015 Last data filed at 03/16/2019 0600 Gross per 24 hour  Intake 1739.45 ml  Output 1825 ml  Net -85.55 ml   Filed Weights   03/16/19 0600  Weight: 77.4 kg    General: In no acute distress HEENT: Moist oral mucosa Neuro: Awake and alert, flat affect, no focal deficits CV: S1-S2 appreciated PULM: Decreased breath sounds bilaterally GI: Bowel sounds appreciated Extremities: Warm/dry Skin: no rashes or lesions   Serum cortisol normal Serum TSH normal  Resolved Hospital Problem list     Assessment & Plan:   Hyponatremia -Acute worsening of hyponatremia -On normal saline -Likely etiology is poor intake of solids -He was trying to keep himself hydrated with solute poor fluids -Goal is to raise sodium level 6 to 8 mEq in 24 hours  Elevated urine osmolality and urine sodium 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7*  NEUTROABS 2.3 1.7  --   HGB 11.2*  13.4 12.9*  HCT 35.0* 35.7* 35.2*  MCV 99.7 83.8 85.4  PLT PLATELET CLUMPS NOTED ON SMEAR, UNABLE TO ESTIMATE 99* 112*    Basic Metabolic Panel: Recent Labs  Lab 03/15/19 1019 03/15/19 1823 03/15/19 2229 03/16/19 0156 03/16/19 0702  NA 111* 108* 110* 112* 112*  K 3.8 3.6 3.4* 3.5 3.7  CL  80* 79* 81* 81* 82*  CO2 21* 19* 19* 20* 18*  GLUCOSE 90 85 90 79 76  BUN 6 7 8 6 7   CREATININE 0.98 1.01 0.98 1.00 1.03  CALCIUM 9.2 8.7* 9.1 8.9 8.8*   GFR: CrCl cannot be calculated (Unknown ideal weight.). Recent Labs  Lab 03/10/19 0845 03/15/19 0149 03/15/19 0721  WBC 3.3* 2.4* 1.7*    Liver Function Tests: Recent Labs  Lab 03/10/19 0845 03/15/19 0149  AST 41 74*  ALT 28 27  ALKPHOS 32* 46  BILITOT 0.9 1.0  PROT 6.5 7.1  ALBUMIN 4.0 4.3   No results for input(s): LIPASE, AMYLASE in the last 168 hours. Recent Labs  Lab 03/15/19 0149  AMMONIA 17    ABG No results found for: PHART, PCO2ART, PO2ART, HCO3, TCO2, ACIDBASEDEF, O2SAT   Coagulation Profile: No results for input(s): INR, PROTIME in the last 168 hours.  Cardiac Enzymes: No results for input(s): CKTOTAL, CKMB, CKMBINDEX, TROPONINI in the last 168 hours.  HbA1C: No results found for: HGBA1C  CBG: Recent Labs  Lab 03/15/19 0031  GLUCAP 90    Review of Systems:     Past Medical History  He,  has a past medical history of Kidney stone, Kidney stone, Kidney stones, and Kidney stones.   Surgical History   History reviewed. No pertinent surgical history.   Social History   reports that he has never smoked. He has never used smokeless tobacco. He reports that he does not drink alcohol or use drugs.   Family History   His family history includes Cancer in his brother; Heart failure in his father.   Allergies No Known Allergies     The patient is critically ill with multiple organ systems failure and requires high complexity decision making for assessment and support, frequent evaluation and titration of therapies, application of advanced monitoring technologies and extensive interpretation of multiple databases. Critical Care Time devoted to patient care services described in this note independent of APP/resident time (if applicable)  is 32 minutes.   03/17/19 MD [facility]  Critical Care Personal pager: [phone] If unanswered, please page CCM On-call: [phone]

## 2019-03-16 NOTE — Progress Notes (Signed)
Sodium noted at 115  Stop normal saline infusion temporarily Follow labs  Recheck Chem-7 at 1600 hrs.

## 2019-03-16 NOTE — Progress Notes (Signed)
CRITICAL VALUE ALERT  Critical Value:  Na 112  Date & Time Notied:  03/16/2019@0256   Provider Notified: MDn Sommer  Orders Received/Actions taken: TBD

## 2019-03-17 LAB — BASIC METABOLIC PANEL
Anion gap: 6 (ref 5–15)
Anion gap: 6 (ref 5–15)
Anion gap: 8 (ref 5–15)
Anion gap: 8 (ref 5–15)
Anion gap: 9 (ref 5–15)
Anion gap: 9 (ref 5–15)
BUN: 11 mg/dL (ref 6–20)
BUN: 11 mg/dL (ref 6–20)
BUN: 8 mg/dL (ref 6–20)
BUN: 8 mg/dL (ref 6–20)
BUN: 8 mg/dL (ref 6–20)
BUN: 9 mg/dL (ref 6–20)
CO2: 21 mmol/L — ABNORMAL LOW (ref 22–32)
CO2: 21 mmol/L — ABNORMAL LOW (ref 22–32)
CO2: 22 mmol/L (ref 22–32)
CO2: 22 mmol/L (ref 22–32)
CO2: 22 mmol/L (ref 22–32)
CO2: 22 mmol/L (ref 22–32)
Calcium: 8.5 mg/dL — ABNORMAL LOW (ref 8.9–10.3)
Calcium: 8.6 mg/dL — ABNORMAL LOW (ref 8.9–10.3)
Calcium: 8.7 mg/dL — ABNORMAL LOW (ref 8.9–10.3)
Calcium: 8.8 mg/dL — ABNORMAL LOW (ref 8.9–10.3)
Calcium: 8.8 mg/dL — ABNORMAL LOW (ref 8.9–10.3)
Calcium: 8.9 mg/dL (ref 8.9–10.3)
Chloride: 89 mmol/L — ABNORMAL LOW (ref 98–111)
Chloride: 90 mmol/L — ABNORMAL LOW (ref 98–111)
Chloride: 90 mmol/L — ABNORMAL LOW (ref 98–111)
Chloride: 92 mmol/L — ABNORMAL LOW (ref 98–111)
Chloride: 92 mmol/L — ABNORMAL LOW (ref 98–111)
Chloride: 93 mmol/L — ABNORMAL LOW (ref 98–111)
Creatinine, Ser: 1.24 mg/dL (ref 0.61–1.24)
Creatinine, Ser: 1.25 mg/dL — ABNORMAL HIGH (ref 0.61–1.24)
Creatinine, Ser: 1.26 mg/dL — ABNORMAL HIGH (ref 0.61–1.24)
Creatinine, Ser: 1.27 mg/dL — ABNORMAL HIGH (ref 0.61–1.24)
Creatinine, Ser: 1.32 mg/dL — ABNORMAL HIGH (ref 0.61–1.24)
Creatinine, Ser: 1.34 mg/dL — ABNORMAL HIGH (ref 0.61–1.24)
GFR calc Af Amer: >60 mL/min (ref >60)
GFR calc Af Amer: >60 mL/min (ref >60)
GFR calc Af Amer: >60 mL/min (ref >60)
GFR calc Af Amer: >60 mL/min (ref >60)
GFR calc Af Amer: >60 mL/min (ref >60)
GFR calc Af Amer: >60 mL/min (ref >60)
GFR calc non Af Amer: >60 mL/min (ref >60)
GFR calc non Af Amer: >60 mL/min (ref >60)
GFR calc non Af Amer: >60 mL/min (ref >60)
GFR calc non Af Amer: >60 mL/min (ref >60)
GFR calc non Af Amer: >60 mL/min (ref >60)
GFR calc non Af Amer: >60 mL/min (ref >60)
Glucose, Bld: 73 mg/dL (ref 70–99)
Glucose, Bld: 76 mg/dL (ref 70–99)
Glucose, Bld: 78 mg/dL (ref 70–99)
Glucose, Bld: 79 mg/dL (ref 70–99)
Glucose, Bld: 80 mg/dL (ref 70–99)
Glucose, Bld: 84 mg/dL (ref 70–99)
Potassium: 3.4 mmol/L — ABNORMAL LOW (ref 3.5–5.1)
Potassium: 3.5 mmol/L (ref 3.5–5.1)
Potassium: 3.5 mmol/L (ref 3.5–5.1)
Potassium: 3.6 mmol/L (ref 3.5–5.1)
Potassium: 3.7 mmol/L (ref 3.5–5.1)
Potassium: 3.9 mmol/L (ref 3.5–5.1)
Sodium: 119 mmol/L — CL (ref 135–145)
Sodium: 120 mmol/L — ABNORMAL LOW (ref 135–145)
Sodium: 120 mmol/L — ABNORMAL LOW (ref 135–145)
Sodium: 120 mmol/L — ABNORMAL LOW (ref 135–145)
Sodium: 121 mmol/L — ABNORMAL LOW (ref 135–145)
Sodium: 122 mmol/L — ABNORMAL LOW (ref 135–145)

## 2019-03-17 MED ORDER — ENOXAPARIN SODIUM 40 MG/0.4ML ~~LOC~~ SOLN
40.0000 mg | SUBCUTANEOUS | Status: DC
  Administered 2019-03-17 – 2019-03-19 (×3): 40 mg via SUBCUTANEOUS
  Filled 2019-03-17 (×3): qty 0.4

## 2019-03-17 MED ORDER — POTASSIUM CHLORIDE CRYS ER 20 MEQ PO TBCR
20.0000 meq | EXTENDED_RELEASE_TABLET | ORAL | Status: AC
  Administered 2019-03-17 (×2): 20 meq via ORAL
  Filled 2019-03-17 (×2): qty 1

## 2019-03-17 MED ORDER — METOCLOPRAMIDE HCL 5 MG/ML IJ SOLN
10.0000 mg | Freq: Once | INTRAMUSCULAR | Status: AC
  Administered 2019-03-17: 10 mg via INTRAVENOUS
  Filled 2019-03-17: qty 2

## 2019-03-17 NOTE — Progress Notes (Signed)
CRITICAL VALUE ALERT  Critical Value:  Na 119  Date & Time Notied:  03/17/2019 @0050   Provider Notified: .  Orders Received/Actions taken: Continue present management

## 2019-03-17 NOTE — Progress Notes (Signed)
[facility] ADULT ICU REPLACEMENT PROTOCOL FOR AM LAB REPLACEMENT ONLY  The patient does apply for the [facility] Adult ICU Electrolyte Replacment Protocol based on the criteria listed below:   1. Is GFR >/= 40 ml/min? Yes.    Patient's GFR today is >60 2. Is urine output >/= 0.5 ml/kg/hr for the last 6 hours? Yes.   Patient's UOP is 1.04 ml/kg/hr 3. Is BUN < 60 mg/dL? Yes.    Patient's BUN today is 8 4. Abnormal electrolyte(s): K-3.5 5. Ordered repletion with: per pprotocol 6. If a panic level lab has been reported, has the CCM MD in charge been notified? Yes.  .   Physician:  Dr. Janne Lab, Dixon Boos 03/17/2019 6:14 AM

## 2019-03-17 NOTE — Progress Notes (Signed)
NAME:  Christopher Lara, MRN:  [id number], DOB:  1975/11/19, LOS: 2 ADMISSION DATE:  03/14/2019, CONSULTATION DATE: 03/15/2019 REFERRING MD: Lilian Kapur , CHIEF COMPLAINT:  confusion  Brief History   44 year old male who presents with Covid diagnosis on 1/12 and severe hyponatremia of 108 likely secondary to poor p.o. intake.  History of present illness   44 year old male with a history of kidney stones who was brought in to the ED for several days of confusion.  He was diagnosed with Covid-19 on 1/12 when he presented for a fall and syncopal episode since then patient has been fatigued mostly in bed with poor p.o. intake and sodium found to be 108.  Patient is slow to answer questions, but oriented to person and place and reports fever and some nausea at home without cough, chest pain or tightness, abdominal pain, diarrhea or abnormally elevated urine output.  He states when he was first diagnosed with Covid-19, he was drinking about 5 small bottles of water or Gatorade in a day then decreased in the last couple of days.   ED work-up was significant for chest x-ray with bilateral patchy infiltrates without oxygen requirement, leukopenia with WBC 2.4, sodium 108 with normal glucose.  Head CT was negative for acute findings. patient was given 500 cc bolus of normal saline and PCCM consulted for admission.  Past Medical History  Kidney stones  Significant Hospital Events   1/17-admit to PCCM  Consults:  PCCM  Procedures:  None  Significant Diagnostic Tests:  1/17 chest x-ray>>Patchy multifocal airspace opacities involving the mid and lower lungs, consistent with multifocal pneumonia 1/17 head CT>> no acute findings  Micro Data:  03/10/2019-blood cultures negative 03/15/2019-MRSA PCR negative Coronavirus positive 03/10/2019 Antimicrobials:  Remdesivir  Interim history/subjective:  Appropriately interactive No overnight events Tolerating oral intake  Objective   Blood pressure (!)  88/60, pulse 74, temperature 98.4 F (36.9 C), temperature source Oral, resp. rate 15, weight 76.1 kg, SpO2 95 %.        Intake/Output Summary (Last 24 hours) at 03/17/2019 1036 Last data filed at 03/17/2019 0800 Gross per 24 hour  Intake 830.32 ml  Output 2600 ml  Net -1769.68 ml   Filed Weights   03/16/19 0600 03/17/19 0430  Weight: 77.4 kg 76.1 kg    General: In no acute distress HEENT: Moist oral mucosa Neuro: Awake and alert CV: S1-S2 appreciated PULM: Clear breath sounds bilaterally GI: Bowel sounds appreciated Extremities: Warm/dry Skin: no rashes or lesions   Serum cortisol normal Serum TSH normal  Resolved Hospital Problem list     Assessment & Plan:   Hyponatremia -Acute worsening of hyponatremia -Continues on normal saline, rate reduced -Likely etiology is poor intake of solids, tolerating already currently -He was trying to keep himself hydrated with solute poor fluids -Goal is cautious increasing sodium levels  Elevated urine osmolality and urine sodium 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 -Blood uric acid level -Fluid restriction  Continue neuro monitoring  COVID-19 pneumonia -Continues to be on room air -Does not require steroids as he is not hypoxemic  He remains stable at present We will transfer to medical floor Still requires isolation for Covid Still requires very close monitoring of his sodium levels  Best practice:  Diet: Regular Pain/Anxiety/Delirium protocol (if indicated): N/A VAP protocol (if indicated): N/A DVT prophylaxis: Lovenox GI prophylaxis: N/A Glucose control: N/A Mobility: With assist Code Status: Full code Family Communication: Attempted to call wife [name] without answer Disposition: ICU  Labs  CBC: Recent Labs  Lab 03/15/19 0149 03/15/19 0721  WBC 2.4* 1.7*  NEUTROABS 1.7  --   HGB 13.4 12.9*  HCT 35.7* 35.2*  MCV 83.8 85.4  PLT 99* 112*    Basic Metabolic Panel: Recent Labs  Lab 03/16/19 1522  03/16/19 1944 03/17/19 0004 03/17/19 0447 03/17/19 0731  NA 115* 116* 119* 120* 121*  K 3.2* 3.2* 3.7 3.5 3.5  CL 84* 86* 90* 89* 90*  CO2 21* 21* 21* 22 22  GLUCOSE 85 85 80 78 76  BUN 6 8 9 8 8   CREATININE 1.25* 1.31* 1.27* 1.34* 1.25*  CALCIUM 8.3* 8.8* 8.6* 8.5* 8.7*   GFR: CrCl cannot be calculated (Unknown ideal weight.). Recent Labs  Lab 03/15/19 0149 03/15/19 0721  WBC 2.4* 1.7*    Liver Function Tests: Recent Labs  Lab 03/15/19 0149  AST 74*  ALT 27  ALKPHOS 46  BILITOT 1.0  PROT 7.1  ALBUMIN 4.3   No results for input(s): LIPASE, AMYLASE in the last 168 hours. Recent Labs  Lab 03/15/19 0149  AMMONIA 17    ABG No results found for: PHART, PCO2ART, PO2ART, HCO3, TCO2, ACIDBASEDEF, O2SAT   Coagulation Profile: No results for input(s): INR, PROTIME in the last 168 hours.  Cardiac Enzymes: No results for input(s): CKTOTAL, CKMB, CKMBINDEX, TROPONINI in the last 168 hours.  HbA1C: No results found for: HGBA1C  CBG: Recent Labs  Lab 03/15/19 0031  GLUCAP 90    Review of Systems:     Past Medical History  He,  has a past medical history of Kidney stone, Kidney stone, Kidney stones, and Kidney stones.   Surgical History   History reviewed. No pertinent surgical history.   Social History   reports that he has never smoked. He has never used smokeless tobacco. He reports that he does not drink alcohol or use drugs.   Family History   His family history includes Cancer in his brother; Heart failure in his father.   Allergies No Known Allergies   The patient is critically ill with multiple organ systems failure and requires high complexity decision making for assessment and support, frequent evaluation and titration of therapies, application of advanced monitoring technologies and extensive interpretation of multiple databases. Critical Care Time devoted to patient care services described in this note independent of APP/resident time (if  applicable)  is 30 minutes.   Sherrilyn Rist MD [facility] Personal pager: [phone] If unanswered, please page CCM On-call: [phone]

## 2019-03-17 NOTE — Progress Notes (Signed)
Intractable hiccups  Give 1 dose of Reglan 10 mg IV

## 2019-03-18 LAB — BASIC METABOLIC PANEL
Anion gap: 7 (ref 5–15)
Anion gap: 10 (ref 5–15)
BUN: 10 mg/dL (ref 6–20)
BUN: 10 mg/dL (ref 6–20)
CO2: 22 mmol/L (ref 22–32)
CO2: 21 mmol/L — ABNORMAL LOW (ref 22–32)
Calcium: 8.5 mg/dL — ABNORMAL LOW (ref 8.9–10.3)
Calcium: 8.9 mg/dL (ref 8.9–10.3)
Chloride: 94 mmol/L — ABNORMAL LOW (ref 98–111)
Chloride: 94 mmol/L — ABNORMAL LOW (ref 98–111)
Creatinine, Ser: 1.24 mg/dL (ref 0.61–1.24)
Creatinine, Ser: 1.27 mg/dL — ABNORMAL HIGH (ref 0.61–1.24)
GFR calc Af Amer: >60 mL/min (ref >60)
GFR calc Af Amer: >60 mL/min (ref >60)
GFR calc non Af Amer: >60 mL/min (ref >60)
GFR calc non Af Amer: >60 mL/min (ref >60)
Glucose, Bld: 76 mg/dL (ref 70–99)
Glucose, Bld: 66 mg/dL — ABNORMAL LOW (ref 70–99)
Potassium: 3.8 mmol/L (ref 3.5–5.1)
Potassium: 4.2 mmol/L (ref 3.5–5.1)
Sodium: 123 mmol/L — ABNORMAL LOW (ref 135–145)
Sodium: 125 mmol/L — ABNORMAL LOW (ref 135–145)

## 2019-03-18 LAB — GLUCOSE, CAPILLARY
Glucose-Capillary: 65 mg/dL — ABNORMAL LOW (ref 70–99)
Glucose-Capillary: 74 mg/dL (ref 70–99)

## 2019-03-18 NOTE — Evaluation (Addendum)
Physical Therapy Evaluation Patient Details Name: Christopher Lara MRN: [id number] DOB: Aug 17, 1975 Today's Date: 03/18/2019   History of Present Illness  44 year old male with a history of kidney stones who was brought in to the ED for several days of confusion.  He was diagnosed with Covid-19 on 1/12 when he presented for a fall and syncopal episode since then patient has been fatigued mostly in bed with poor p.o. intake and sodium found to be 108.    Clinical Impression  Pt admitted with above diagnosis. PTA pt lived with his wife, independent and works full-time as a Development worker, community carrier. On eval, he required min assist bed mobility, min assist transfers and min assist ambulation 100' with RW. Pt mobilized on RA with desat to 87%. SpO2 93% at rest on RA. Pt currently with functional limitations due to the deficits listed below (see PT Problem List). Pt will benefit from skilled PT to increase their independence and safety with mobility to allow discharge to the venue listed below.       Follow Up Recommendations Home health PT;Supervision/Assistance - 24 hour    Equipment Recommendations  Rolling walker with 5" wheels    Recommendations for Other Services       Precautions / Restrictions Precautions Precautions: Fall      Mobility  Bed Mobility Overal bed mobility: Needs Assistance Bed Mobility: Rolling;Sidelying to Sit Rolling: Modified independent (Device/Increase time) Sidelying to sit: Min assist;HOB elevated       General bed mobility comments: +rail, increased time, cues for sequencing  Transfers Overall transfer level: Needs assistance Equipment used: Rolling walker (2 wheeled) Transfers: Sit to/from Stand Sit to Stand: Min assist         General transfer comment: cues for hand placement, assist to power up  Ambulation/Gait Ambulation/Gait assistance: Min assist Gait Distance (Feet): 100 Feet Assistive device: Rolling walker (2 wheeled) Gait Pattern/deviations:  Step-through pattern;Decreased stride length Gait velocity: decreased Gait velocity interpretation: <1.31 ft/sec, indicative of household ambulator General Gait Details: Ambulated on RA with desat to 87%. Slow, steady gait. Fatigue noted.  Stairs            Wheelchair Mobility    Modified Rankin (Stroke Patients Only)       Balance Overall balance assessment: Needs assistance Sitting-balance support: No upper extremity supported;Feet supported Sitting balance-Leahy Scale: Good Sitting balance - Comments: able to don socks EOB min guard assist   Standing balance support: Bilateral upper extremity supported;During functional activity Standing balance-Leahy Scale: Poor Standing balance comment: reliant on UE support                             Pertinent Vitals/Pain Pain Assessment: No/denies pain    Home Living Family/patient expects to be discharged to:: Private residence Living Arrangements: Spouse/significant other Available Help at Discharge: Family;Available 24 hours/day Type of Home: House Home Access: Stairs to enter Entrance Stairs-Rails: Psychiatric nurse of Steps: 3 Home Layout: One level Home Equipment: None      Prior Function Level of Independence: Independent         Comments: Works for the [employer], mail delivery.     Hand Dominance        Extremity/Trunk Assessment   Upper Extremity Assessment Upper Extremity Assessment: Generalized weakness    Lower Extremity Assessment Lower Extremity Assessment: Generalized weakness    Cervical / Trunk Assessment Cervical / Trunk Assessment: Normal  Communication   Communication: No difficulties  Cognition Arousal/Alertness: Awake/alert Behavior During Therapy: Flat affect Overall Cognitive Status: Impaired/Different from baseline Area of Impairment: Safety/judgement;Problem solving                         Safety/Judgement: Decreased awareness  of safety   Problem Solving: Slow processing;Requires verbal cues;Difficulty sequencing General Comments: Very flat with minimal verbalizations. A&O x 3. Following directions.      General Comments General comments (skin integrity, edema, etc.): SpO2 93% at rest on RA. Desat to 87% during amb. Max HR 117.    Exercises     Assessment/Plan    PT Assessment Patient needs continued PT services  PT Problem List Decreased strength;Decreased mobility;Decreased safety awareness;Decreased knowledge of precautions;Decreased activity tolerance;Cardiopulmonary status limiting activity;Decreased balance;Decreased knowledge of use of DME       PT Treatment Interventions DME instruction;Therapeutic activities;Cognitive remediation;Gait training;Therapeutic exercise;Patient/family education;Stair training;Balance training;Functional mobility training    PT Goals (Current goals can be found in the Care Plan section)  Acute Rehab PT Goals Patient Stated Goal: get stronger PT Goal Formulation: With patient Time For Goal Achievement: 04/01/19 Potential to Achieve Goals: Good    Frequency Min 4X/week   Barriers to discharge        Co-evaluation               AM-PAC PT "6 Clicks" Mobility  Outcome Measure Help needed turning from your back to your side while in a flat bed without using bedrails?: A Little Help needed moving from lying on your back to sitting on the side of a flat bed without using bedrails?: A Little Help needed moving to and from a bed to a chair (including a wheelchair)?: A Little Help needed standing up from a chair using your arms (e.g., wheelchair or bedside chair)?: A Little Help needed to walk in hospital room?: A Little Help needed climbing 3-5 steps with a railing? : A Lot 6 Click Score: 17    End of Session Equipment Utilized During Treatment: Gait belt Activity Tolerance: Patient tolerated treatment well Patient left: in chair;with chair alarm set;with  call bell/phone within reach Nurse Communication: Mobility status PT Visit Diagnosis: Unsteadiness on feet (R26.81);Muscle weakness (generalized) (M62.81);History of falling (Z91.81)    Time: 0973-5329 PT Time Calculation (min) (ACUTE ONLY): 31 min   Charges:   PT Evaluation $PT Eval Moderate Complexity: 1 Mod PT Treatments $Gait Training: 8-22 mins        Aida Raider, PT  Office # [phone] Pager [phone]   Ilda Foil 03/18/2019, 12:46 PM

## 2019-03-18 NOTE — Progress Notes (Signed)
PROGRESS NOTE    Christopher Lara  ZOX:[id number] DOB: 06/11/1975 DOA: 03/14/2019 PCP: Patient, No Pcp Per  Brief Narrative:  44 year old male with a history of kidney stones who was brought in to the ED for several days of confusion.  He was diagnosed with Covid-19 on 1/12 when he presented for a fall and syncopal episode since then patient has been fatigued mostly in bed with poor p.o. intake and sodium found to be 108.  Patient is slow to answer questions, but oriented to person and place and reports fever and some nausea at home without cough, chest pain or tightness, abdominal pain, diarrhea or abnormally elevated urine output.  He states when he was first diagnosed with Covid-19, he was drinking about 5 small bottles of water or Gatorade in a day then decreased in the last couple of days.   ED work-up was significant for chest x-ray with bilateral patchy infiltrates without oxygen requirement, leukopenia with WBC 2.4, sodium 108 with normal glucose.  Head CT was negative for acute findings. patient was given 500 cc bolus of normal saline and PCCM consulted for admission.  Sodium level gradually improved with NS infusion.   [facility] assumed care on 03/18/19    Assessment & Plan:   Active Problems:   Hyponatremia   Hyponatremia, Acute, severe, with mentation changes and sodium of 108 on presentation.  In setting of poor intake per mouth and intake of solute poor oral fluids at home.   -Continues on normal saline, rate reduced -He was trying to keep himself hydrated with solute poor fluids -Continue telemetry due to low sodium.  -Trend sodium level, goal is to increase it gtadually -Continue neuro monitoring- CT head with no edema or acute changes  COVID-19 pneumonia -Continues to be on room air but with dyspnea on ambulation.  -Continue pulse ox monitoring -Does not require steroids as he is not hypoxemic -Still requires isolation for Covid   Physical debility Due  to acute, severe illness and hyponatremia PT eval with recs for [facility] PT once ready for dc   DVT prophylaxis: Lovenox Code Status: Full Family Communication: Discussed with the patient.  Disposition Plan: Once sodium further improves, home with [facility]   Consultants:   None, pt was being seen by PCMM until 1/19  Procedures:  None Antimicrobials:  None  Subjective: Patient continues to tolerate solid foods. Denies nausea, vomiting, or diarrhea, he feels weak.   Objective: Vitals:   03/18/19 0500 03/18/19 0624 03/18/19 1045 03/18/19 1525  BP:  91/77 (!) 82/57 (!) 87/52  Pulse:  93 90 91  Resp:  (!) 21 (!) 22 20  Temp:  98.4 F (36.9 C)  99.6 F (37.6 C)  TempSrc:  Oral  Oral  SpO2:  95% 100% 93%  Weight: 77.2 kg       Intake/Output Summary (Last 24 hours) at 03/18/2019 1813 Last data filed at 03/18/2019 0600 Gross per 24 hour  Intake 886.85 ml  Output 1000 ml  Net -113.15 ml   Filed Weights   03/16/19 0600 03/17/19 0430 03/18/19 0500  Weight: 77.4 kg 76.1 kg 77.2 kg    Examination:  General exam: Appears calm and comfortable. Sitting up in recliner.  Respiratory system: Respiratory effort normal. No wheezing.  Cardiovascular system: S1 & S2  Present.  RRR. No edema.  Gastrointestinal system: Abdomen is nondistended, soft and nontender.  Central nervous system: Alert and orientedx3, no facial droop, able to move ext x4 voluntarily. Following commands, asking questions.  Psychiatry: Mood & affect appropriate.     Data Reviewed: I have personally reviewed following labs and imaging studies  CBC: Recent Labs  Lab 03/15/19 0149 03/15/19 0721  WBC 2.4* 1.7*  NEUTROABS 1.7  --   HGB 13.4 12.9*  HCT 35.7* 35.2*  MCV 83.8 85.4  PLT 99* 196*   Basic Metabolic Panel: Recent Labs  Lab 03/17/19 1147 03/17/19 1635 03/17/19 1955 03/18/19 0005 03/18/19 0511  NA 120* 120* 122* 123* 125*  K 3.4* 3.6 3.9 3.8 4.2  CL 92* 92* 93* 94* 94*  CO2 22 22 21* 22 21*    GLUCOSE 84 79 73 76 66*  BUN 8 11 11 10 10   CREATININE 1.32* 1.26* 1.24 1.24 1.27*  CALCIUM 8.8* 8.8* 8.9 8.5* 8.9   GFR: CrCl cannot be calculated (Unknown ideal weight.). Liver Function Tests: Recent Labs  Lab 03/15/19 0149  AST 74*  ALT 27  ALKPHOS 46  BILITOT 1.0  PROT 7.1  ALBUMIN 4.3   No results for input(s): LIPASE, AMYLASE in the last 168 hours. Recent Labs  Lab 03/15/19 0149  AMMONIA 17   Coagulation Profile: No results for input(s): INR, PROTIME in the last 168 hours. Cardiac Enzymes: No results for input(s): CKTOTAL, CKMB, CKMBINDEX, TROPONINI in the last 168 hours. BNP (last 3 results) No results for input(s): PROBNP in the last 8760 hours. HbA1C: No results for input(s): HGBA1C in the last 72 hours. CBG: Recent Labs  Lab 03/15/19 0031 03/18/19 0649 03/18/19 1159  GLUCAP 90 65* 74   Lipid Profile: No results for input(s): CHOL, HDL, LDLCALC, TRIG, CHOLHDL, LDLDIRECT in the last 72 hours. Thyroid Function Tests: No results for input(s): TSH, T4TOTAL, FREET4, T3FREE, THYROIDAB in the last 72 hours. Anemia Panel: No results for input(s): VITAMINB12, FOLATE, FERRITIN, TIBC, IRON, RETICCTPCT in the last 72 hours. Sepsis Labs: No results for input(s): PROCALCITON, LATICACIDVEN in the last 168 hours.  Recent Results (from the past 240 hour(s))  Blood culture (routine x 2)     Status: None   Collection Time: 03/10/19 12:12 PM   Specimen: Right Antecubital; Blood  Result Value Ref Range Status   Specimen Description   Final    RIGHT ANTECUBITAL Performed at [facility] [address] [place], [place] [address]    Special Requests   Final    BOTTLES DRAWN AEROBIC AND ANAEROBIC Blood Culture adequate volume Performed at [facility] [address] [place], [place] [address]    Culture   Final    NO GROWTH 5 DAYS Performed at [facility], [place] [address], [place], [place] [address]    Report Status  03/15/2019 FINAL  Final  Blood culture (routine x 2)     Status: None   Collection Time: 03/10/19 12:12 PM   Specimen: BLOOD RIGHT HAND  Result Value Ref Range Status   Specimen Description   Final    BLOOD RIGHT HAND Performed at [facility] [address] [place], [place] [address]    Special Requests   Final    BOTTLES DRAWN AEROBIC ONLY Blood Culture adequate volume Performed at [facility] [address], [place], [place] [address]    Culture   Final    NO GROWTH 5 DAYS Performed at [facility], [place] [address], [place],  [address]    Report Status 03/15/2019 FINAL  Final  MRSA PCR Screening     Status: None  Collection Time: 03/15/19 10:15 AM   Specimen: Nasal Mucosa; Nasopharyngeal  Result Value Ref Range Status   MRSA by PCR NEGATIVE NEGATIVE Final    Comment:        The GeneXpert MRSA Assay (FDA approved for NASAL specimens only), is one component of a comprehensive MRSA colonization surveillance program. It is not intended to diagnose MRSA infection nor to guide or monitor treatment for MRSA infections. Performed at [facility], [address], [place], [place] [address]          Radiology Studies: No results found.      Scheduled Meds: . enoxaparin (LOVENOX) injection  40 mg Subcutaneous Q24H  . tamsulosin  0.4 mg Oral Daily   Continuous Infusions: . sodium chloride 75 mL/hr at 03/18/19 0600     LOS: 3 days    Time spent: 35 minutes    Ky Barban, MD [facility]   If 7PM-7AM, please contact night-coverage [email or web address] Password [facility] 03/18/2019, 6:13 PM

## 2019-03-19 LAB — BASIC METABOLIC PANEL
Anion gap: 7 (ref 5–15)
BUN: 7 mg/dL (ref 6–20)
CO2: 24 mmol/L (ref 22–32)
Calcium: 9 mg/dL (ref 8.9–10.3)
Chloride: 100 mmol/L (ref 98–111)
Creatinine, Ser: 1.1 mg/dL (ref 0.61–1.24)
GFR calc Af Amer: >60 mL/min (ref >60)
GFR calc non Af Amer: >60 mL/min (ref >60)
Glucose, Bld: 83 mg/dL (ref 70–99)
Potassium: 3.9 mmol/L (ref 3.5–5.1)
Sodium: 131 mmol/L — ABNORMAL LOW (ref 135–145)

## 2019-03-19 NOTE — Progress Notes (Signed)
Pts brother and wife called for update.

## 2019-03-19 NOTE — Evaluation (Signed)
Occupational Therapy Evaluation Patient Details Name: Christopher Lara MRN: [id number] DOB: [birth year] Today's Date: 03/19/2019    History of Present Illness 44 year old male with a history of kidney stones who was brought in to the ED for several days of confusion.  He was diagnosed with Covid-19 on 1/12 when he presented for a fall and syncopal episode since then patient has been fatigued mostly in bed with poor p.o. intake and sodium found to be 108.   Clinical Impression   Pt was functioning independently prior to admission. Presents with generalized weakness, decreased activity tolerance and mild unsteadiness in standing. He requires set up to min assist for ADL and OOB mobility. Pt with flat affect and answers questions appropriately, minimally conversing during session. VSS on RA. Anticipate pt will progress well. Will follow acutely.    Follow Up Recommendations  No OT follow up    Equipment Recommendations  None recommended by OT    Recommendations for Other Services       Precautions / Restrictions Precautions Precautions: Fall Restrictions Weight Bearing Restrictions: No      Mobility Bed Mobility               General bed mobility comments: received in chair  Transfers Overall transfer level: Needs assistance Equipment used: 1 person hand held assist Transfers: Sit to/from Stand;Stand Pivot Transfers Sit to Stand: Min assist Stand pivot transfers: Min assist       General transfer comment: steadying assist    Balance Overall balance assessment: Needs assistance   Sitting balance-Leahy Scale: Good     Standing balance support: Single extremity supported Standing balance-Leahy Scale: Fair Standing balance comment: statically                           ADL either performed or assessed with clinical judgement   ADL Overall ADL's : Needs assistance/impaired Eating/Feeding: Independent;Sitting   Grooming: Set up;Sitting   Upper Body  Bathing: Set up;Sitting   Lower Body Bathing: Minimal assistance;Sit to/from stand   Upper Body Dressing : Set up;Sitting   Lower Body Dressing: Minimal assistance;Sit to/from stand   Toilet Transfer: Minimal assistance;Stand-pivot   Toileting- Clothing Manipulation and Hygiene: Minimal assistance;Sit to/from stand               Vision Baseline Vision/History: No visual deficits Patient Visual Report: No change from baseline       Perception     Praxis      Pertinent Vitals/Pain Pain Assessment: No/denies pain     Hand Dominance Left   Extremity/Trunk Assessment Upper Extremity Assessment Upper Extremity Assessment: Overall WFL for tasks assessed   Lower Extremity Assessment Lower Extremity Assessment: Defer to PT evaluation   Cervical / Trunk Assessment Cervical / Trunk Assessment: Normal   Communication Communication Communication: No difficulties   Cognition Arousal/Alertness: Awake/alert Behavior During Therapy: Flat affect   Area of Impairment: Safety/judgement;Problem solving                         Safety/Judgement: Decreased awareness of safety   Problem Solving: Slow processing;Requires verbal cues;Difficulty sequencing General Comments: minimally conversive, following commands, decreased effort   General Comments       Exercises     Shoulder Instructions      Home Living Family/patient expects to be discharged to:: Private residence Living Arrangements: Spouse/significant other Available Help at Discharge: Family;Available 24 hours/day Type of Home: [place]  Access: Stairs to enter [employer] of Steps: 3 Entrance Stairs-Rails: Right;Left Home Layout: One level     Bathroom Shower/Tub: Chief Strategy Officer: Standard     Home Equipment: None          Prior Functioning/Environment Level of Independence: Independent        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         OT Problem List: Decreased activity tolerance;Impaired balance (sitting and/or standing);Decreased strength;Decreased knowledge of use of DME or AE      OT Treatment/Interventions: Self-care/ADL training;DME and/or AE instruction;Patient/family education;Balance training;Cognitive remediation/compensation;Therapeutic activities    OT Goals(Current goals can be found in the care plan section) Acute Rehab OT Goals Patient Stated Goal: get stronger OT Goal Formulation: With patient Time For Goal Achievement: 04/02/19 Potential to Achieve Goals: Good ADL Goals Pt Will Perform Grooming: with supervision;standing Pt Will Perform Lower Body Bathing: with supervision;sit to/from stand Pt Will Perform Lower Body Dressing: with supervision;sit to/from stand Pt Will Transfer to Toilet: with supervision;ambulating;regular height toilet Pt Will Perform Toileting - Clothing Manipulation and hygiene: with supervision;sit to/from stand Additional ADL Goal #1: Pt will participate in formal cognitive assessment. Additional ADL Goal #2: Pt will gather items necessary for ADL around his room with supervision.  OT Frequency: Min 2X/week   Barriers to D/C:            Co-evaluation              AM-PAC OT "6 Clicks" Daily Activity     Outcome Measure Help from another person eating meals?: None Help from another person taking care of personal grooming?: A Little Help from another person toileting, which includes using toliet, bedpan, or urinal?: A Little Help from another person bathing (including washing, rinsing, drying)?: A Little Help from another person to put on and taking off regular upper body clothing?: None Help from another person to put on and taking off regular lower body clothing?: A Little 6 Click Score: 20   End of Session    Activity Tolerance: Patient tolerated treatment well Patient left: in chair;with call bell/phone within reach  OT Visit Diagnosis:  Unsteadiness on feet (R26.81);Other abnormalities of gait and mobility (R26.89);Muscle weakness (generalized) (M62.81);Other symptoms and signs involving cognitive function                Time: 1137-1150 OT Time Calculation (min): 13 min Charges:  OT General Charges $OT Visit: 1 Visit OT Evaluation $OT Eval Moderate Complexity: 1 Mod  Martie Round, OTR/L Acute Rehabilitation Services Pager: [phone] Office: [phone] Evern Bio 03/19/2019, 1:43 PM

## 2019-03-19 NOTE — Progress Notes (Signed)
PROGRESS NOTE    Christopher Lara  ZOX:[id number] DOB: 1976-01-11 DOA: 03/14/2019 PCP: Patient, No Pcp Per  Brief Narrative:  44 year old male with a history of kidney stones who was brought in to the ED for several days of confusion.  He was diagnosed with Covid-19 on 1/12 when he presented for a fall and syncopal episode since then patient has been fatigued mostly in bed with poor p.o. intake and sodium found to be 108.  Patient is slow to answer questions, but oriented to person and place and reports fever and some nausea at home without cough, chest pain or tightness, abdominal pain, diarrhea or abnormally elevated urine output.  He states when he was first diagnosed with Covid-19, he was drinking about 5 small bottles of water or Gatorade in a day then decreased in the last couple of days.   ED work-up was significant for chest x-ray with bilateral patchy infiltrates without oxygen requirement, leukopenia with WBC 2.4, sodium 108 with normal glucose.  Head CT was negative for acute findings. patient was given 500 cc bolus of normal saline and PCCM consulted for admission.  Sodium level gradually improved with NS infusion.   [facility] assumed care on 03/18/19    Assessment & Plan:   Active Problems:   Hyponatremia   Hyponatremia, Acute, severe, with mentation changes and sodium of 108 on presentation.  In setting of poor intake per mouth and intake of solute poor oral fluids at home.   -Continues on normal saline, rate reduced -He was trying to keep himself hydrated with solute poor fluids -Continue telemetry due to low sodium.  -Trend sodium level, goal is to increase it gradually -Continue neuro monitoring- CT head with no edema or acute changes -He reports feeling better today, tolerating solid foods. Sodium level trending up.   COVID-19 pneumonia -Continues to be on room air but with dyspnea on ambulation.  -Continue pulse ox monitoring -Does not require steroids as  he is not hypoxemic -Still requires isolation for Covid   Physical debility Due to acute, severe illness and hyponatremia PT eval with recs for [facility] PT once ready for dc   DVT prophylaxis: Lovenox Code Status: Full Family Communication: Discussed with the patient.  Disposition Plan: Once sodium further improves, home with HH, may be ready for dc tomorrow.   Consultants:   None, pt was being seen by PCMM until 1/19  Procedures:  None Antimicrobials:  None  Subjective: He reports feeling better today. He is tolerating more po intake. Denies nausea or diarrhea.   Objective: Vitals:   03/19/19 0622 03/19/19 0623 03/19/19 1100 03/19/19 1557  BP:  108/73 97/69 103/68  Pulse:  85  85  Resp:  18 19 18   Temp: 98.5 F (36.9 C) 98.5 F (36.9 C) 98.6 F (37 C) 99.1 F (37.3 C)  TempSrc: Oral Oral Oral Oral  SpO2:    95%  Weight:        Intake/Output Summary (Last 24 hours) at 03/19/2019 1859 Last data filed at 03/19/2019 1500 Gross per 24 hour  Intake 1808.7 ml  Output 850 ml  Net 958.7 ml   Filed Weights   03/16/19 0600 03/17/19 0430 03/18/19 0500  Weight: 77.4 kg 76.1 kg 77.2 kg    Examination:  General exam: Appears calm and comfortable. Sitting up in recliner.  Respiratory system: Respiratory effort normal. No wheezing.  Cardiovascular system: S1 & S2  Present.  RRR. No edema.  Gastrointestinal system: Abdomen is nondistended, soft and  nontender.  Central nervous system: Alert and orientedx3, no facial droop, able to move ext x4 voluntarily. Following commands, asking questions. Psychiatry: Mood & affect appropriate.     Data Reviewed: I have personally reviewed following labs and imaging studies  CBC: Recent Labs  Lab 03/15/19 0149 03/15/19 0721  WBC 2.4* 1.7*  NEUTROABS 1.7  --   HGB 13.4 12.9*  HCT 35.7* 35.2*  MCV 83.8 85.4  PLT 99* 151*   Basic Metabolic Panel: Recent Labs  Lab 03/17/19 1635 03/17/19 1955 03/18/19 0005 03/18/19 0511  03/19/19 1051  NA 120* 122* 123* 125* 131*  K 3.6 3.9 3.8 4.2 3.9  CL 92* 93* 94* 94* 100  CO2 22 21* 22 21* 24  GLUCOSE 79 73 76 66* 83  BUN 11 11 10 10 7   CREATININE 1.26* 1.24 1.24 1.27* 1.10  CALCIUM 8.8* 8.9 8.5* 8.9 9.0   GFR: CrCl cannot be calculated (Unknown ideal weight.). Liver Function Tests: Recent Labs  Lab 03/15/19 0149  AST 74*  ALT 27  ALKPHOS 46  BILITOT 1.0  PROT 7.1  ALBUMIN 4.3   No results for input(s): LIPASE, AMYLASE in the last 168 hours. Recent Labs  Lab 03/15/19 0149  AMMONIA 17   Coagulation Profile: No results for input(s): INR, PROTIME in the last 168 hours. Cardiac Enzymes: No results for input(s): CKTOTAL, CKMB, CKMBINDEX, TROPONINI in the last 168 hours. BNP (last 3 results) No results for input(s): PROBNP in the last 8760 hours. HbA1C: No results for input(s): HGBA1C in the last 72 hours. CBG: Recent Labs  Lab 03/15/19 0031 03/18/19 0649 03/18/19 1159  GLUCAP 90 65* 74   Lipid Profile: No results for input(s): CHOL, HDL, LDLCALC, TRIG, CHOLHDL, LDLDIRECT in the last 72 hours. Thyroid Function Tests: No results for input(s): TSH, T4TOTAL, FREET4, T3FREE, THYROIDAB in the last 72 hours. Anemia Panel: No results for input(s): VITAMINB12, FOLATE, FERRITIN, TIBC, IRON, RETICCTPCT in the last 72 hours. Sepsis Labs: No results for input(s): PROCALCITON, LATICACIDVEN in the last 168 hours.  Recent Results (from the past 240 hour(s))  Blood culture (routine x 2)     Status: None   Collection Time: 03/10/19 12:12 PM   Specimen: Right Antecubital; Blood  Result Value Ref Range Status   Specimen Description   Final    RIGHT ANTECUBITAL Performed at [facility] [address], [place], [place] [address]    Special Requests   Final    BOTTLES DRAWN AEROBIC AND ANAEROBIC Blood Culture adequate volume Performed at [facility] [address], [place], [place] [address]    Culture   Final     NO GROWTH 5 DAYS Performed at [facility], [place] [address], [place], [place] [address]    Report Status 03/15/2019 FINAL  Final  Blood culture (routine x 2)     Status: None   Collection Time: 03/10/19 12:12 PM   Specimen: BLOOD RIGHT HAND  Result Value Ref Range Status   Specimen Description   Final    BLOOD RIGHT HAND Performed at [facility] [address], [place], [place] [address]    Special Requests   Final    BOTTLES DRAWN AEROBIC ONLY Blood Culture adequate volume Performed at [facility] [address], [place], [place] [address]    Culture   Final    NO GROWTH 5 DAYS Performed at [facility], [place] [address] [place], [place] [address]  Report Status 03/15/2019 FINAL  Final  MRSA PCR Screening     Status: None   Collection Time: 03/15/19 10:15 AM   Specimen: Nasal Mucosa; Nasopharyngeal  Result Value Ref Range Status   MRSA by PCR NEGATIVE NEGATIVE Final    Comment:        The GeneXpert MRSA Assay (FDA approved for NASAL specimens only), is one component of a comprehensive MRSA colonization surveillance program. It is not intended to diagnose MRSA infection nor to guide or monitor treatment for MRSA infections. Performed at [facility], [address] [place], [place] [address]          Radiology Studies: No results found.      Scheduled Meds: . enoxaparin (LOVENOX) injection  40 mg Subcutaneous Q24H  . tamsulosin  0.4 mg Oral Daily   Continuous Infusions: . sodium chloride 75 mL/hr at 03/19/19 1101     LOS: 4 days    Time spent: 35 minutes    Ky Barban, MD [facility]   If 7PM-7AM, please contact night-coverage [email or web address] Password [facility] 03/19/2019, 6:59 PM

## 2019-03-20 DIAGNOSIS — U071 COVID-19: Secondary | ICD-10-CM | POA: Diagnosis present

## 2019-03-20 DIAGNOSIS — R5381 Other malaise: Secondary | ICD-10-CM | POA: Diagnosis present

## 2019-03-20 LAB — BASIC METABOLIC PANEL
Anion gap: 8 (ref 5–15)
BUN: 8 mg/dL (ref 6–20)
CO2: 25 mmol/L (ref 22–32)
Calcium: 9 mg/dL (ref 8.9–10.3)
Chloride: 100 mmol/L (ref 98–111)
Creatinine, Ser: 0.98 mg/dL (ref 0.61–1.24)
GFR calc Af Amer: >60 mL/min (ref >60)
GFR calc non Af Amer: >60 mL/min (ref >60)
Glucose, Bld: 67 mg/dL — ABNORMAL LOW (ref 70–99)
Potassium: 4.1 mmol/L (ref 3.5–5.1)
Sodium: 133 mmol/L — ABNORMAL LOW (ref 135–145)

## 2019-03-20 NOTE — Plan of Care (Signed)
  Problem: Education: Goal: Knowledge of General Education information will improve Description: Including pain rating scale, medication(s)/side effects and non-pharmacologic comfort measures Outcome: Progressing   Problem: Clinical Measurements: Goal: Ability to maintain clinical measurements within normal limits will improve Outcome: Progressing Goal: Will remain free from infection Outcome: Progressing Goal: Respiratory complications will improve Outcome: Progressing   Problem: Activity: Goal: Risk for activity intolerance will decrease Outcome: Progressing   Problem: Coping: Goal: Level of anxiety will decrease Outcome: Progressing   Problem: Elimination: Goal: Will not experience complications related to urinary retention Outcome: Progressing   Problem: Pain Managment: Goal: General experience of comfort will improve Outcome: Progressing   Problem: Safety: Goal: Ability to remain free from injury will improve Outcome: Progressing   Problem: Skin Integrity: Goal: Risk for impaired skin integrity will decrease Outcome: Progressing

## 2019-03-20 NOTE — Progress Notes (Signed)
Patient remained alert and orientedx 3, Patient ambulated to bathroom this AM with walker, tolerated well, no SOB noted. Patient is willing to continue ambulating to bathroom

## 2019-03-20 NOTE — Discharge Summary (Addendum)
Physician Discharge Summary  Christopher Lara IOE:[id number] DOB: [birth year] DOA: 03/14/2019  PCP: Patient, No Pcp Per  Admit date: 03/14/2019 Discharge date: 03/20/2019  Admitted From: Home Disposition:  Home with [facility] PT  Recommendations for Outpatient Follow-up:  1. Follow up with PCP in 1-2 weeks 2. Please obtain BMP/CBC in one week 3. Please follow up on the following pending results:  Home Health: Yes, PT Equipment/Devices: Walker Discharge Condition: Stable CODE STATUS: Full Diet recommendation: Regular    Brief/Interim Summary:  44 year old male with a history of kidney stones who was brought in to the ED for several days of confusion. He was diagnosed with Covid-19 on 1/12 when he presented for a fall and syncopal episode since then patient has been fatigued mostly in bed with poor p.o. intake and sodium found to be 108. Patient is slow to answer questions, but oriented to person and place and reports fever and some nausea at home without cough, chest pain or tightness, abdominal pain, diarrhea or abnormally elevated urine output. He states when he was first diagnosed with Covid-19, he was drinking about 5 small bottles of water or Gatorade in a day then decreased in the last couple of days.   ED work-up was significant for chest x-ray with bilateral patchy infiltrates without oxygen requirement, leukopenia with WBC 2.4, sodium 108 with normal glucose. Head CT was negative for acute findings. patient was given 500 cc bolus of normal saline and PCCM consulted for admission.  Sodium level gradually improved with NS infusion.   [facility] assumed care on 03/18/19  His sodium gradually improved with NS infusion and increase in oral intake.  He was seen by PT with recommended home PT due to physical debility and abnormal gait. He had walker ordered prior to his discharge. His sodium level was 133 on the day of his discharge. He denied nausea, vomiting, or diarrhea.   Discharge Diagnoses:  Active Problems:   Hyponatremia   Physical debility   Pneumonia due to COVID-19 virus  Hyponatremia, Acute, severe, with mentation changes and sodium of 108 on presentation.  In setting of poor intake per mouth and intake of solute poor oral fluids at home.   -Initially admitted to the ICU, sodium gradually trended up.  -He was trying to keep himself hydrated with solute poor fluids, patient advised to avoid drinking pta fluids, continue regular diet, water, juice, milk, for hydration.  -Continue telemetry due to low sodium.  -He reports feeling better today, tolerating solid foods. Sodium level trended up to 133.   COVID-19 pneumonia -Continues to be on room air but with dyspnea on ambulation.  -Does not require steroids as he is not hypoxemic -Still requires isolation for Covid until Feb 2   Physical debility Due to acute, severe illness and hyponatremia PT eval with recs for [facility] PT which was ordered at the time of discharge.   Discharge Instructions  Discharge Instructions    Diet general   Complete by: As directed    Increase activity slowly   Complete by: As directed    MyChart COVID-19 home monitoring program   Complete by: Mar 20, 2019    Is the patient willing to use the MyChart Mobile App for home monitoring?: No     Allergies as of 03/20/2019   No Known Allergies     Medication List    TAKE these medications   acetaminophen 500 MG tablet Commonly known as: TYLENOL Take 500 mg by mouth every 6 (six) hours  as needed for headache.   ondansetron 4 MG tablet Commonly known as: ZOFRAN Take 4 mg by mouth 3 (three) times daily as needed.   oxyCODONE-acetaminophen 5-325 MG tablet Commonly known as: PERCOCET/ROXICET Take 1 tablet by mouth daily as needed for moderate pain.   tamsulosin 0.4 MG Caps capsule Commonly known as: FLOMAX Take 0.4 mg by mouth daily.            Durable Medical Equipment  (From admission, onward)          Start     Ordered   03/20/19 1050  For home use only DME Walker rolling  Once    Question Answer Comment  Walker: With 5 Inch Wheels   Patient needs a walker to treat with the following condition Mobility impaired      03/20/19 1050          No Known Allergies  Consultations:  PCCM   Procedures/Studies: CT ABDOMEN PELVIS WO CONTRAST  Result Date: 03/15/2019 CLINICAL DATA:  Acute generalized abdominal pain. EXAM: CT ABDOMEN AND PELVIS WITHOUT CONTRAST TECHNIQUE: Multidetector CT imaging of the abdomen and pelvis was performed following the standard protocol without IV contrast. COMPARISON:  March 10, 2019. FINDINGS: Lower chest: Mildly increased multiple opacities are noted in the visualized portions of both lung bases concerning for multifocal pneumonia, potentially of viral etiology. Minimal pericardial effusion is noted. Hepatobiliary: No focal liver abnormality is seen. No gallstones, gallbladder wall thickening, or biliary dilatation. Pancreas: Unremarkable. No pancreatic ductal dilatation or surrounding inflammatory changes. Spleen: Normal in size without focal abnormality. Adrenals/Urinary Tract: Adrenal glands appear normal. Nonobstructive left nephrolithiasis is noted. Minimal left hydroureteronephrosis is noted secondary to 4 mm calculus at the left ureterovesical junction. Mildly distended urinary bladder is noted. Stomach/Bowel: Stomach is within normal limits. Appendix appears normal. No evidence of bowel wall thickening, distention, or inflammatory changes. Vascular/Lymphatic: No significant vascular findings are present. No enlarged abdominal or pelvic lymph nodes. Reproductive: Prostate is unremarkable. Other: No abdominal wall hernia or abnormality. No abdominopelvic ascites. Musculoskeletal: No acute or significant osseous findings. IMPRESSION: 1. Mildly increased multiple opacities are noted in the visualized portions of both lung bases concerning for multifocal  pneumonia, potentially of viral etiology. 2. Minimal pericardial effusion. 3. Nonobstructive left nephrolithiasis. Minimal left hydroureteronephrosis is noted secondary to 4 mm calculus at the left ureterovesical junction. Electronically Signed   By: Lupita Raider M.D.   On: 03/15/2019 08:37   CT Head Wo Contrast  Result Date: 03/15/2019 CLINICAL DATA:  Multiple recent falls. Altered mental status. COVID-19 positive. EXAM: CT HEAD WITHOUT CONTRAST TECHNIQUE: Contiguous axial images were obtained from the base of the skull through the vertex without intravenous contrast. COMPARISON:  CT head dated Jul 09, 2010. FINDINGS: Brain: No evidence of acute infarction, hemorrhage, hydrocephalus, extra-axial collection or mass lesion/mass effect. Vascular: No hyperdense vessel or unexpected calcification. Skull: Normal. Negative for fracture or focal lesion. Sinuses/Orbits: No acute finding. Other: None. IMPRESSION: No acute intracranial abnormality. Electronically Signed   By: Katherine Mantle M.D.   On: 03/15/2019 01:35   CT ABDOMEN PELVIS W CONTRAST  Result Date: 03/10/2019 CLINICAL DATA:  Flank pain EXAM: CT ABDOMEN AND PELVIS WITH CONTRAST TECHNIQUE: Multidetector CT imaging of the abdomen and pelvis was performed using the standard protocol following bolus administration of intravenous contrast. CONTRAST:  OMNIPAQUE IOHEXOL 300 MG/ML  SOLN COMPARISON:  February 12, 2019 FINDINGS: Lower chest: There are areas of patchy somewhat nodular appearing opacity in the lung bases  bilaterally. No pleural effusions evident. Hepatobiliary: No focal liver lesions are evident. The gallbladder wall is not appreciably thickened. There is no biliary duct dilatation. Pancreas: There is no pancreatic mass or inflammatory focus. Spleen: No splenic lesions are evident. Adrenals/Urinary Tract: Adrenals bilaterally appear normal. There is a cyst arising in the upper pole of the right kidney measuring 8 x 7 mm. There is a 1 x 1  cm cyst in the medial lower pole of the right kidney. There is a 4 mm probable cyst in the posterior mid left kidney. There is no perinephric stranding on each side. Kidney show symmetric enhancement bilaterally. There is an extrarenal pelvis on the right, an anatomic variant. A smaller left extrarenal pelvis noted. There is a calculus in the posterior upper pole region on the left measuring 5 x 5 mm. There is a calculus in the lower pole of the right kidney measuring 3 x 3 mm. There is a 4 x 3 mm calculus in the distal left ureter immediately proximal to the left ureterovesical junction. No other ureteral calculi are evident. Urinary bladder is midline with wall thickness normal. Stomach/Bowel: There is no appreciable bowel wall or mesenteric thickening. No evident bowel obstruction. Terminal ileum appears normal. There is no evident free air or portal venous air. Vascular/Lymphatic: There is no abdominal aortic aneurysm. There is mild plaque in the distal aorta. There is a focal area of calcification in each common iliac artery. No adenopathy is appreciable in the abdomen or pelvis. Reproductive: Prostate and seminal vesicles are normal in size and contour. No pelvic mass. Other: There is a small amount of ascites in the dependent portion of the pelvis. Appendix appears normal. No evident abscess in the abdomen or pelvis. Musculoskeletal: There is anterior wedging of the T10 vertebral body, age uncertain but not present on study from slightly less than 1 month prior. There is degenerative change in the lower lumbar spine. No blastic or lytic bone lesions. No intramuscular lesions evident. IMPRESSION: 1. 4 x 3 mm calculus in the distal left ureter near the ureterovesical junction without appreciable hydronephrosis on the left. 2.  Nonobstructing calculi in each kidney. 3. Multifocal somewhat nodular appearing opacities in the lung bases consistent with atypical organism pneumonia. Note that COVID-19 pneumonia may  present in this manner. 4. Recent fracture of the T10 vertebral body without retropulsion of bone evident. Note that this fracture was not present on recent CT examination of the abdomen and pelvis. 5. Small amount of ascites in the dependent portion the pelvis which may have reactive etiology. 6. No bowel obstruction. No abscess in the abdomen pelvis. Appendix appears normal. 7.  Aortic Atherosclerosis (ICD10-I70.0). Electronically Signed   By: Bretta BangWilliam  Woodruff III M.D.   On: 03/10/2019 10:56   DG Chest Portable 1 View  Result Date: 03/15/2019 CLINICAL DATA:  Initial evaluation for acute altered mental status, known COVID infection. EXAM: PORTABLE CHEST 1 VIEW COMPARISON:  Prior radiograph from 03/10/2019. FINDINGS: Transverse heart size within normal limits. Mediastinal silhouette normal. Lungs are hypoinflated. Patchy multifocal opacities involving the mid and lower lungs bilaterally, consistent with multifocal infection. No edema or effusion. No pneumothorax. No acute osseous finding. IMPRESSION: 1. Patchy multifocal airspace opacities involving the mid and lower lungs, consistent with multifocal pneumonia, presumably related to history of COVID infection. 2. No other active cardiopulmonary disease. Electronically Signed   By: Rise MuBenjamin  McClintock M.D.   On: 03/15/2019 01:17   DG Chest Portable 1 View  Result Date: 03/10/2019 CLINICAL  DATA:  COVID, syncope. EXAM: PORTABLE CHEST 1 VIEW COMPARISON:  None FINDINGS: Cardiomediastinal contours are normal. Hilar structures are unremarkable. Lungs are clear without signs of consolidation or effusion. Visualized skeletal structures are unremarkable. IMPRESSION: No acute cardiopulmonary disease. Electronically Signed   By: Donzetta Kohut M.D.   On: 03/10/2019 10:25    Subjective: He feels better, tolerating a diet well with no nausea, vomiting, or diarrhea.   Discharge Exam: Vitals:   03/19/19 2123 03/20/19 1156  BP: 108/73 (!) 98/59  Pulse: 87 74   Resp: 20   Temp: 99.3 F (37.4 C) 98.8 F (37.1 C)  SpO2: 96% 97%   Vitals:   03/19/19 1557 03/19/19 2123 03/20/19 0415 03/20/19 1156  BP: 103/68 108/73  (!) 98/59  Pulse: 85 87  74  Resp: 18 20    Temp: 99.1 F (37.3 C) 99.3 F (37.4 C)  98.8 F (37.1 C)  TempSrc: Oral Oral  Oral  SpO2: 95% 96%  97%  Weight:   77.7 kg     General: Pt is alert, awake, not in acute distress Cardiovascular: RRR, S1/S2 present Respiratory: No respiratory distress, no wheezing Abdominal: Soft, NT, ND Extremities: no edema, no cyanosis    The results of significant diagnostics from this hospitalization (including imaging, microbiology, ancillary and laboratory) are listed below for reference.     Microbiology: Recent Results (from the past 240 hour(s))  MRSA PCR Screening     Status: None   Collection Time: 03/15/19 10:15 AM   Specimen: Nasal Mucosa; Nasopharyngeal  Result Value Ref Range Status   MRSA by PCR NEGATIVE NEGATIVE Final    Comment:        The GeneXpert MRSA Assay (FDA approved for NASAL specimens only), is one component of a comprehensive MRSA colonization surveillance program. It is not intended to diagnose MRSA infection nor to guide or monitor treatment for MRSA infections. Performed at [facility], [address], [place], [place] [address]      Labs: BNP (last 3 results) No results for input(s): BNP in the last 8760 hours. Basic Metabolic Panel: Recent Labs  Lab 03/17/19 1955 03/18/19 0005 03/18/19 0511 03/19/19 1051 03/20/19 0440  NA 122* 123* 125* 131* 133*  K 3.9 3.8 4.2 3.9 4.1  CL 93* 94* 94* 100 100  CO2 21* 22 21* 24 25  GLUCOSE 73 76 66* 83 67*  BUN 11 10 10 7 8   CREATININE 1.24 1.24 1.27* 1.10 0.98  CALCIUM 8.9 8.5* 8.9 9.0 9.0   Liver Function Tests: Recent Labs  Lab 03/15/19 0149  AST 74*  ALT 27  ALKPHOS 46  BILITOT 1.0  PROT 7.1  ALBUMIN 4.3   No results for input(s): LIPASE, AMYLASE in the last 168 hours. Recent  Labs  Lab 03/15/19 0149  AMMONIA 17   CBC: Recent Labs  Lab 03/15/19 0149 03/15/19 0721  WBC 2.4* 1.7*  NEUTROABS 1.7  --   HGB 13.4 12.9*  HCT 35.7* 35.2*  MCV 83.8 85.4  PLT 99* 112*   Cardiac Enzymes: No results for input(s): CKTOTAL, CKMB, CKMBINDEX, TROPONINI in the last 168 hours. BNP: Invalid input(s): POCBNP CBG: Recent Labs  Lab 03/15/19 0031 03/18/19 0649 03/18/19 1159  GLUCAP 90 65* 74   D-Dimer No results for input(s): DDIMER in the last 72 hours. Hgb A1c No results for input(s): HGBA1C in the last 72 hours. Lipid Profile No results for input(s): CHOL, HDL, LDLCALC, TRIG, CHOLHDL, LDLDIRECT in the last 72 hours.  Thyroid function studies No results for input(s): TSH, T4TOTAL, T3FREE, THYROIDAB in the last 72 hours.  Invalid input(s): FREET3 Anemia work up No results for input(s): VITAMINB12, FOLATE, FERRITIN, TIBC, IRON, RETICCTPCT in the last 72 hours. Urinalysis    Component Value Date/Time   COLORURINE YELLOW 03/15/2019 1958   APPEARANCEUR CLEAR 03/15/2019 1958   LABSPEC 1.016 03/15/2019 1958   PHURINE 6.0 03/15/2019 [place] NEGATIVE 03/15/2019 1958   HGBUR NEGATIVE 03/15/2019 1958   BILIRUBINUR NEGATIVE 03/15/2019 1958   KETONESUR 20 (A) 03/15/2019 1958   PROTEINUR 100 (A) 03/15/2019 1958   UROBILINOGEN 0.2 11/06/2014 0811   NITRITE NEGATIVE 03/15/2019 1958   LEUKOCYTESUR NEGATIVE 03/15/2019 1958   Sepsis Labs Invalid input(s): PROCALCITONIN,  WBC,  LACTICIDVEN Microbiology Recent Results (from the past 240 hour(s))  MRSA PCR Screening     Status: None   Collection Time: 03/15/19 10:15 AM   Specimen: Nasal Mucosa; Nasopharyngeal  Result Value Ref Range Status   MRSA by PCR NEGATIVE NEGATIVE Final    Comment:        The GeneXpert MRSA Assay (FDA approved for NASAL specimens only), is one component of a comprehensive MRSA colonization surveillance program. It is not intended to diagnose MRSA infection nor to guide  or monitor treatment for MRSA infections. Performed at [facility], [place] [address], [place], [place] [address]      Time coordinating discharge: Over 31 minutes  SIGNED:   Blain Pais, MD  [facility] 03/20/2019, 3:26 PM   If 7PM-7AM, please contact night-coverage [email or web address] Password [email or web address]

## 2019-03-20 NOTE — Progress Notes (Signed)
Physical Therapy Treatment Patient Details Name: Christopher Lara MRN: [id number] DOB: 1975-09-18 Today's Date: 03/20/2019    History of Present Illness 44 year old male with a history of kidney stones who was brought in to the ED for several days of confusion.  He was diagnosed with Covid-19 on 1/12 when he presented for a fall and syncopal episode since then patient has been fatigued mostly in bed with poor p.o. intake and sodium found to be 108.    PT Comments    Pt more conversive and engaged today. He required min assist bed mobility, min assist transfers and min guard assist ambulation 200' with RW. Pt mobilized on RA with desat to 88%. Improved to 95% at rest in recliner. Amb in room without AD, presenting with slower and more guarded gait. RW remains appropriate for increased distances.    Follow Up Recommendations  Home health PT;Supervision/Assistance - 24 hour     Equipment Recommendations  Rolling walker with 5" wheels    Recommendations for Other Services       Precautions / Restrictions Precautions Precautions: Fall    Mobility  Bed Mobility Overal bed mobility: Needs Assistance Bed Mobility: Rolling;Sidelying to Sit Rolling: Modified independent (Device/Increase time) Sidelying to sit: Min assist;HOB elevated       General bed mobility comments: +rail, increased time, assist to elevate trunk  Transfers Overall transfer level: Needs assistance Equipment used: Ambulation equipment used Transfers: Sit to/from [employer] Sit to Stand: Min assist Stand pivot transfers: Min assist       General transfer comment: assist to stabilize balance  Ambulation/Gait Ambulation/Gait assistance: Min guard Gait Distance (Feet): 200 Feet Assistive device: Rolling walker (2 wheeled) Gait Pattern/deviations: Step-through pattern;Decreased stride length Gait velocity: decreased Gait velocity interpretation: <1.31 ft/sec, indicative of household  ambulator General Gait Details: Amb on RA with desat to 88%. Slow, steady gait with RW in hallway. Pt able to amb in room without AD min guard assist, slow guarded gait.   Stairs             Wheelchair Mobility    Modified Rankin (Stroke Patients Only)       Balance Overall balance assessment: Needs assistance Sitting-balance support: No upper extremity supported;Feet supported Sitting balance-Leahy Scale: Good     Standing balance support: No upper extremity supported;During functional activity Standing balance-Leahy Scale: Fair Standing balance comment: amb in room without AD, RW for longer distances                            Cognition Arousal/Alertness: Awake/alert Behavior During Therapy: Flat affect Overall Cognitive Status: Impaired/Different from baseline Area of Impairment: Safety/judgement;Problem solving                         Safety/Judgement: Decreased awareness of safety;Decreased awareness of deficits   Problem Solving: Slow processing;Requires verbal cues;Difficulty sequencing General Comments: More engaged in session today. Pt's gown/bed wet with urine on arrival and pt unaware.      Exercises      General Comments General comments (skin integrity, edema, etc.): SpO2 95% at rest on RA. Desat to 88% on RA during amb.      Pertinent Vitals/Pain Pain Assessment: No/denies pain    Home Living                      Prior Function  PT Goals (current goals can now be found in the care plan section) Acute Rehab PT Goals Patient Stated Goal: get stronger Progress towards PT goals: Progressing toward goals    Frequency    Min 4X/week      PT Plan Current plan remains appropriate    Co-evaluation              AM-PAC PT "6 Clicks" Mobility   Outcome Measure  Help needed turning from your back to your side while in a flat bed without using bedrails?: None Help needed moving from lying on  your back to sitting on the side of a flat bed without using bedrails?: A Little Help needed moving to and from a bed to a chair (including a wheelchair)?: A Little Help needed standing up from a chair using your arms (e.g., wheelchair or bedside chair)?: A Little Help needed to walk in hospital room?: A Little Help needed climbing 3-5 steps with a railing? : A Little 6 Click Score: 19    End of Session Equipment Utilized During Treatment: Gait belt Activity Tolerance: Patient tolerated treatment well Patient left: in chair;with call bell/phone within reach;with chair alarm set Nurse Communication: Mobility status PT Visit Diagnosis: Unsteadiness on feet (R26.81);Muscle weakness (generalized) (M62.81);History of falling (Z91.81)     Time: 1937-9024 PT Time Calculation (min) (ACUTE ONLY): 15 min  Charges:  $Gait Training: 8-22 mins                     Christopher Lara, PT  Office # [phone] Pager [phone]    Christopher Lara 03/20/2019, 12:31 PM

## 2019-03-20 NOTE — Plan of Care (Signed)
  Problem: Education: Goal: Knowledge of General Education information will improve Description: Including pain rating scale, medication(s)/side effects and non-pharmacologic comfort measures Outcome: Adequate for Discharge   Problem: Clinical Measurements: Goal: Ability to maintain clinical measurements within normal limits will improve Outcome: Adequate for Discharge   Problem: Clinical Measurements: Goal: Will remain free from infection Outcome: Adequate for Discharge   Problem: Clinical Measurements: Goal: Respiratory complications will improve Outcome: Adequate for Discharge

## 2019-03-20 NOTE — Progress Notes (Signed)
Updated wife on patient status.

## 2019-03-20 NOTE — TOC Initial Note (Addendum)
Transition of Care [facility]) - Initial/Assessment Note    Patient Details  Name: Christopher Lara MRN: [id number] Date of Birth: December 16, 1975  Transition of Care [facility]) CM/SW Contact:    Cherylann Parr, RN Phone Number: 03/20/2019, 10:53 AM  Clinical Narrative:   PTA independent from home with spouse.  CM spoke with both pt and wife.  Both are aware that pt is nearing discharge and recommendations for HH/DME and 24 hour supervision.  Wife confirms she can provide 24 hour supervision.  Pt in agreement with RW and HH as recommended - CM offered medicare.gov HH list choice - pt chose [facility] - agency accepted.  Pt chose [facility] for RW - agency contacted and referral accepted.  CM request orders            Expected Discharge Plan: Home w Home Health Services Barriers to Discharge: Continued Medical Work up   Patient Goals and CMS Choice   CMS Medicare.gov Compare Post Acute Care list provided to:: Patient Choice offered to / list presented to : Patient  Expected Discharge Plan and Services Expected Discharge Plan: Home w Home Health Services       Living arrangements for the past 2 months: Single Family Home                    RW with [facility]       HH Arranged: PT HH Agency: [facility] Date [facility] Agency Contacted: 03/20/19 Time HH Agency Contacted: 1052 Representative spoke with at [facility] Agency: Elnita Maxwell  Prior Living Arrangements/Services Living arrangements for the past 2 months: Single Family Home Lives with:: Spouse Patient language and need for interpreter reviewed:: Yes Do you feel safe going back to the place where you live?: Yes      Need for Family Participation in Patient Care: Yes (Comment) Care giver support system in place?: Yes (comment)   Criminal Activity/Legal Involvement Pertinent to Current Situation/Hospitalization: No - Comment as needed  Activities of Daily Living      Permission Sought/Granted   Permission granted to share information  with : Yes, Verbal Permission Granted     Permission granted to share info w AGENCY: [facility]        Emotional Assessment   Attitude/Demeanor/Rapport: Engaged Affect (typically observed): Accepting Orientation: : Oriented to Self, Oriented to Place, Oriented to  Time, Oriented to Situation   Psych Involvement: No (comment)  Admission diagnosis:  Hyponatremia [E87.1] COVID-19 [U07.1] Patient Active Problem List   Diagnosis Date Noted  . Hyponatremia 03/15/2019   PCP:  Patient, No Pcp Per Pharmacy:   [id number] - [place], [place] - [address] AT [address]NER OF [address] [facility] [address] [place] [address] Phone: [phone] Fax: [phone]     Social Determinants of Health (SDOH) Interventions    Readmission Risk Interventions No flowsheet data found.

## 2019-03-20 NOTE — Progress Notes (Signed)
Nsg Discharge Note  Admit Date:  03/14/2019 Discharge date: 03/20/2019   JARROD MCENERY to be D/C'd Home per MD order.  AVS completed.  Copy for chart, and copy for patient signed, and dated. Patient/caregiver able to verbalize understanding.  Discharge Medication: Allergies as of 03/20/2019   No Known Allergies     Medication List    TAKE these medications   acetaminophen 500 MG tablet Commonly known as: TYLENOL Take 500 mg by mouth every 6 (six) hours as needed for headache.   ondansetron 4 MG tablet Commonly known as: ZOFRAN Take 4 mg by mouth 3 (three) times daily as needed.   oxyCODONE-acetaminophen 5-325 MG tablet Commonly known as: PERCOCET/ROXICET Take 1 tablet by mouth daily as needed for moderate pain.   tamsulosin 0.4 MG Caps capsule Commonly known as: FLOMAX Take 0.4 mg by mouth daily.            Durable Medical Equipment  (From admission, onward)         Start     Ordered   03/20/19 1050  For home use only DME Walker rolling  Once    Question Answer Comment  Walker: With 5 Inch Wheels   Patient needs a walker to treat with the following condition Mobility impaired      03/20/19 1050          Discharge Assessment: Vitals:   03/19/19 2123 03/20/19 1156  BP: 108/73 (!) 98/59  Pulse: 87 74  Resp: 20   Temp: 99.3 F (37.4 C) 98.8 F (37.1 C)  SpO2: 96% 97%   Skin clean, dry and intact without evidence of skin break down, no evidence of skin tears noted. IV catheter discontinued intact. Site without signs and symptoms of complications - no redness or edema noted at insertion site, patient denies c/o pain - only slight tenderness at site.  Dressing with slight pressure applied.  D/c Instructions-Education: Discharge instructions given to patient/family with verbalized understanding. D/c education completed with patient/family including follow up instructions, medication list, d/c activities limitations if indicated, with other d/c instructions  as indicated by MD - patient able to verbalize understanding, all questions fully answered. Patient instructed to return to ED, call 911, or call MD for any changes in condition.  Patient escorted via WC, and D/C home via private auto.  Delphia Grates, RN 03/20/2019 5:13 PM

## 2019-03-26 ENCOUNTER — Encounter: Payer: Self-pay | Admitting: Internal Medicine

## 2019-03-26 ENCOUNTER — Ambulatory Visit (INDEPENDENT_AMBULATORY_CARE_PROVIDER_SITE_OTHER): Payer: Federal, State, Local not specified - PPO | Admitting: Internal Medicine

## 2019-03-26 DIAGNOSIS — Z7689 Persons encountering health services in other specified circumstances: Secondary | ICD-10-CM

## 2019-03-26 DIAGNOSIS — U071 COVID-19: Secondary | ICD-10-CM

## 2019-03-26 DIAGNOSIS — E871 Hypo-osmolality and hyponatremia: Secondary | ICD-10-CM

## 2019-03-26 DIAGNOSIS — J1282 Pneumonia due to coronavirus disease 2019: Secondary | ICD-10-CM

## 2019-03-26 NOTE — Progress Notes (Signed)
Virtual Visit via Telephone Note  I connected with Christopher Lara, on 03/26/2019 at 9:29 AM by telephone due to the COVID-19 pandemic and verified that I am speaking with the correct person using two identifiers.   Consent: I discussed the limitations, risks, security and privacy concerns of performing an evaluation and management service by telephone and the availability of in person appointments. I also discussed with the patient that there may be a patient responsible charge related to this service. The patient expressed understanding and agreed to proceed.   Location of Patient: Home   Location of Provider: Clinic    Persons participating in Telemedicine visit: Wasif Simonich [facility] Dr. Earlene Plater      History of Present Illness: Patient has visit to get established for care. Was admitted on 1/17 for hyponatremia with Na of 108 and exhibited signs of confusion. At discharge, Na was 133. Patient also had a COVID+ test with pneumonia on 1/12. Was tested for COVID after a syncopal episode and was taken to ED via EMS.   Patient reports he just feels run down and fatigued. Denies fevers, SOB, cough. Taking PO intake well. No longer using walker at home and is ambulating well per patient. Denies LOC or syncope. No lightheadedness or dizziness. No difficulty in urinating.    Past Medical History:  Diagnosis Date  . Kidney stone   . Kidney stone   . Kidney stones   . Kidney stones    No Known Allergies  Current Outpatient Medications on File Prior to Visit  Medication Sig Dispense Refill  . [DISCONTINUED] omeprazole (PRILOSEC) 20 MG capsule Take 1 capsule (20 mg total) by mouth daily. 30 minutes prior to meal. (Patient not taking: Reported on 10/01/2014) 30 capsule 0   No current facility-administered medications on file prior to visit.    Observations/Objective: NAD. Speaking clearly.  Work of breathing normal.  Alert and oriented. Mood appropriate.   Assessment and  Plan: 1. Encounter to establish care   2. Hyponatremia Most recent Na 133 on 1/17. Patient reports good fluid intake. No confusion or syncopal events. No neurological deficits. Return for lab visit.  - Basic Metabolic Panel; Future  3. Pneumonia due to COVID-19 virus No respiratory concerns. Fatigue and generalized weakness remains but improving.    Follow Up Instructions: Return for lab visit    I discussed the assessment and treatment plan with the patient. The patient was provided an opportunity to ask questions and all were answered. The patient agreed with the plan and demonstrated an understanding of the instructions.   The patient was advised to call back or seek an in-person evaluation if the symptoms worsen or if the condition fails to improve as anticipated.     I provided 12 minutes total of non-face-to-face time during this encounter including median intraservice time, reviewing previous notes, investigations, ordering medications, medical decision making, coordinating care and patient verbalized understanding at the end of the visit.    Marcy Siren, D.O. Primary Care at [facility]  03/26/2019, 9:29 AM

## 2019-03-26 NOTE — Patient Instructions (Signed)
Thank you for choosing Primary Care at [facility] to be your medical home!    Christopher Lara was seen by De Hollingshead, DO today.   Christopher Lara's primary care provider is Marcy Siren, DO.   For the best care possible, you should try to see Marcy Siren, DO whenever you come to the clinic.   We look forward to seeing you again soon!  If you have any questions about your visit today, please call us at [phone] or feel free to reach your primary care provider via MyChart.

## 2019-04-08 DIAGNOSIS — N202 Calculus of kidney with calculus of ureter: Secondary | ICD-10-CM | POA: Diagnosis not present

## 2019-04-21 ENCOUNTER — Other Ambulatory Visit: Payer: Self-pay | Admitting: Internal Medicine

## 2019-04-21 ENCOUNTER — Other Ambulatory Visit: Payer: Federal, State, Local not specified - PPO

## 2019-04-21 ENCOUNTER — Other Ambulatory Visit: Payer: Self-pay

## 2019-04-21 DIAGNOSIS — S39012A Strain of muscle, fascia and tendon of lower back, initial encounter: Secondary | ICD-10-CM | POA: Diagnosis not present

## 2019-04-21 DIAGNOSIS — E871 Hypo-osmolality and hyponatremia: Secondary | ICD-10-CM | POA: Diagnosis not present

## 2019-04-21 DIAGNOSIS — S3992XA Unspecified injury of lower back, initial encounter: Secondary | ICD-10-CM | POA: Diagnosis not present

## 2019-04-21 MED ORDER — CYCLOBENZAPRINE HCL 10 MG PO TABS: 10.0000 mg | ORAL_TABLET | Freq: Three times a day (TID) | ORAL | 0 refills | Status: DC | PRN

## 2019-04-21 NOTE — Progress Notes (Signed)
Patient here for repeat BMP 

## 2019-04-22 LAB — BASIC METABOLIC PANEL
BUN/Creatinine Ratio: 8 — ABNORMAL LOW (ref 9–20)
BUN: 9 mg/dL (ref 6–24)
CO2: 23 mmol/L (ref 20–29)
Calcium: 11.3 mg/dL — ABNORMAL HIGH (ref 8.7–10.2)
Chloride: 102 mmol/L (ref 96–106)
Creatinine, Ser: 1.12 mg/dL (ref 0.76–1.27)
GFR calc Af Amer: 91 mL/min/{1.73_m2} (ref >59)
GFR calc non Af Amer: 79 mL/min/{1.73_m2} (ref >59)
Glucose: 93 mg/dL (ref 65–99)
Potassium: 4.6 mmol/L (ref 3.5–5.2)
Sodium: 138 mmol/L (ref 134–144)

## 2019-04-24 ENCOUNTER — Telehealth: Payer: Self-pay | Admitting: Internal Medicine

## 2019-04-24 NOTE — Telephone Encounter (Signed)
Patient came and dropped off paperwork for [facility]

## 2019-04-28 DIAGNOSIS — N200 Calculus of kidney: Secondary | ICD-10-CM | POA: Diagnosis not present

## 2019-05-13 DIAGNOSIS — M109 Gout, unspecified: Secondary | ICD-10-CM | POA: Diagnosis not present

## 2019-06-18 DIAGNOSIS — M47814 Spondylosis without myelopathy or radiculopathy, thoracic region: Secondary | ICD-10-CM | POA: Diagnosis not present

## 2019-06-18 DIAGNOSIS — M5136 Other intervertebral disc degeneration, lumbar region: Secondary | ICD-10-CM | POA: Diagnosis not present

## 2019-06-18 DIAGNOSIS — M546 Pain in thoracic spine: Secondary | ICD-10-CM | POA: Diagnosis not present

## 2019-06-18 DIAGNOSIS — M545 Low back pain: Secondary | ICD-10-CM | POA: Diagnosis not present

## 2019-06-18 DIAGNOSIS — S22079A Unspecified fracture of T9-T10 vertebra, initial encounter for closed fracture: Secondary | ICD-10-CM | POA: Diagnosis not present

## 2019-06-19 ENCOUNTER — Other Ambulatory Visit: Payer: Self-pay | Admitting: Urology

## 2019-06-26 DIAGNOSIS — G894 Chronic pain syndrome: Secondary | ICD-10-CM | POA: Diagnosis not present

## 2019-06-26 DIAGNOSIS — M6281 Muscle weakness (generalized): Secondary | ICD-10-CM | POA: Diagnosis not present

## 2019-06-26 DIAGNOSIS — M47814 Spondylosis without myelopathy or radiculopathy, thoracic region: Secondary | ICD-10-CM | POA: Diagnosis not present

## 2019-06-30 DIAGNOSIS — G894 Chronic pain syndrome: Secondary | ICD-10-CM | POA: Diagnosis not present

## 2019-06-30 DIAGNOSIS — M47814 Spondylosis without myelopathy or radiculopathy, thoracic region: Secondary | ICD-10-CM | POA: Diagnosis not present

## 2019-06-30 DIAGNOSIS — M6281 Muscle weakness (generalized): Secondary | ICD-10-CM | POA: Diagnosis not present

## 2019-07-02 DIAGNOSIS — G894 Chronic pain syndrome: Secondary | ICD-10-CM | POA: Diagnosis not present

## 2019-07-02 DIAGNOSIS — M6281 Muscle weakness (generalized): Secondary | ICD-10-CM | POA: Diagnosis not present

## 2019-07-02 DIAGNOSIS — M47814 Spondylosis without myelopathy or radiculopathy, thoracic region: Secondary | ICD-10-CM | POA: Diagnosis not present

## 2019-07-07 DIAGNOSIS — M6281 Muscle weakness (generalized): Secondary | ICD-10-CM | POA: Diagnosis not present

## 2019-07-07 DIAGNOSIS — G894 Chronic pain syndrome: Secondary | ICD-10-CM | POA: Diagnosis not present

## 2019-07-07 DIAGNOSIS — M47814 Spondylosis without myelopathy or radiculopathy, thoracic region: Secondary | ICD-10-CM | POA: Diagnosis not present

## 2019-07-08 ENCOUNTER — Other Ambulatory Visit: Payer: Self-pay | Admitting: Urology

## 2019-07-09 DIAGNOSIS — G894 Chronic pain syndrome: Secondary | ICD-10-CM | POA: Diagnosis not present

## 2019-07-09 DIAGNOSIS — M47814 Spondylosis without myelopathy or radiculopathy, thoracic region: Secondary | ICD-10-CM | POA: Diagnosis not present

## 2019-07-09 DIAGNOSIS — M6281 Muscle weakness (generalized): Secondary | ICD-10-CM | POA: Diagnosis not present

## 2019-07-14 DIAGNOSIS — M6281 Muscle weakness (generalized): Secondary | ICD-10-CM | POA: Diagnosis not present

## 2019-07-14 DIAGNOSIS — M47814 Spondylosis without myelopathy or radiculopathy, thoracic region: Secondary | ICD-10-CM | POA: Diagnosis not present

## 2019-07-14 DIAGNOSIS — G894 Chronic pain syndrome: Secondary | ICD-10-CM | POA: Diagnosis not present

## 2019-07-16 DIAGNOSIS — G894 Chronic pain syndrome: Secondary | ICD-10-CM | POA: Diagnosis not present

## 2019-07-16 DIAGNOSIS — M6281 Muscle weakness (generalized): Secondary | ICD-10-CM | POA: Diagnosis not present

## 2019-07-16 DIAGNOSIS — M47814 Spondylosis without myelopathy or radiculopathy, thoracic region: Secondary | ICD-10-CM | POA: Diagnosis not present

## 2019-07-21 DIAGNOSIS — G894 Chronic pain syndrome: Secondary | ICD-10-CM | POA: Diagnosis not present

## 2019-07-21 DIAGNOSIS — M6281 Muscle weakness (generalized): Secondary | ICD-10-CM | POA: Diagnosis not present

## 2019-07-21 DIAGNOSIS — M47814 Spondylosis without myelopathy or radiculopathy, thoracic region: Secondary | ICD-10-CM | POA: Diagnosis not present

## 2019-07-23 DIAGNOSIS — M6281 Muscle weakness (generalized): Secondary | ICD-10-CM | POA: Diagnosis not present

## 2019-07-23 DIAGNOSIS — M47814 Spondylosis without myelopathy or radiculopathy, thoracic region: Secondary | ICD-10-CM | POA: Diagnosis not present

## 2019-07-23 DIAGNOSIS — G894 Chronic pain syndrome: Secondary | ICD-10-CM | POA: Diagnosis not present

## 2019-07-30 DIAGNOSIS — M5136 Other intervertebral disc degeneration, lumbar region: Secondary | ICD-10-CM | POA: Diagnosis not present

## 2019-07-30 DIAGNOSIS — M47814 Spondylosis without myelopathy or radiculopathy, thoracic region: Secondary | ICD-10-CM | POA: Diagnosis not present

## 2019-07-30 DIAGNOSIS — S22079A Unspecified fracture of T9-T10 vertebra, initial encounter for closed fracture: Secondary | ICD-10-CM | POA: Diagnosis not present

## 2019-08-04 DIAGNOSIS — M47814 Spondylosis without myelopathy or radiculopathy, thoracic region: Secondary | ICD-10-CM | POA: Diagnosis not present

## 2019-08-04 DIAGNOSIS — G894 Chronic pain syndrome: Secondary | ICD-10-CM | POA: Diagnosis not present

## 2019-08-04 DIAGNOSIS — M6281 Muscle weakness (generalized): Secondary | ICD-10-CM | POA: Diagnosis not present

## 2019-08-06 DIAGNOSIS — M47814 Spondylosis without myelopathy or radiculopathy, thoracic region: Secondary | ICD-10-CM | POA: Diagnosis not present

## 2019-08-06 DIAGNOSIS — G894 Chronic pain syndrome: Secondary | ICD-10-CM | POA: Diagnosis not present

## 2019-08-06 DIAGNOSIS — M6281 Muscle weakness (generalized): Secondary | ICD-10-CM | POA: Diagnosis not present

## 2019-08-11 DIAGNOSIS — M6281 Muscle weakness (generalized): Secondary | ICD-10-CM | POA: Diagnosis not present

## 2019-08-11 DIAGNOSIS — M47814 Spondylosis without myelopathy or radiculopathy, thoracic region: Secondary | ICD-10-CM | POA: Diagnosis not present

## 2019-08-11 DIAGNOSIS — G894 Chronic pain syndrome: Secondary | ICD-10-CM | POA: Diagnosis not present

## 2019-08-13 DIAGNOSIS — M47814 Spondylosis without myelopathy or radiculopathy, thoracic region: Secondary | ICD-10-CM | POA: Diagnosis not present

## 2019-08-13 DIAGNOSIS — G894 Chronic pain syndrome: Secondary | ICD-10-CM | POA: Diagnosis not present

## 2019-08-13 DIAGNOSIS — M6281 Muscle weakness (generalized): Secondary | ICD-10-CM | POA: Diagnosis not present

## 2019-08-18 DIAGNOSIS — M6281 Muscle weakness (generalized): Secondary | ICD-10-CM | POA: Diagnosis not present

## 2019-08-18 DIAGNOSIS — M47814 Spondylosis without myelopathy or radiculopathy, thoracic region: Secondary | ICD-10-CM | POA: Diagnosis not present

## 2019-08-18 DIAGNOSIS — G894 Chronic pain syndrome: Secondary | ICD-10-CM | POA: Diagnosis not present

## 2019-08-25 DIAGNOSIS — M47814 Spondylosis without myelopathy or radiculopathy, thoracic region: Secondary | ICD-10-CM | POA: Diagnosis not present

## 2019-08-25 DIAGNOSIS — G894 Chronic pain syndrome: Secondary | ICD-10-CM | POA: Diagnosis not present

## 2019-08-25 DIAGNOSIS — M6281 Muscle weakness (generalized): Secondary | ICD-10-CM | POA: Diagnosis not present

## 2019-09-09 DIAGNOSIS — M5136 Other intervertebral disc degeneration, lumbar region: Secondary | ICD-10-CM | POA: Diagnosis not present

## 2019-09-09 DIAGNOSIS — G894 Chronic pain syndrome: Secondary | ICD-10-CM | POA: Diagnosis not present

## 2019-09-09 DIAGNOSIS — M6281 Muscle weakness (generalized): Secondary | ICD-10-CM | POA: Diagnosis not present

## 2019-09-09 DIAGNOSIS — M47814 Spondylosis without myelopathy or radiculopathy, thoracic region: Secondary | ICD-10-CM | POA: Diagnosis not present

## 2019-09-09 DIAGNOSIS — M545 Low back pain: Secondary | ICD-10-CM | POA: Diagnosis not present

## 2020-01-05 DIAGNOSIS — R110 Nausea: Secondary | ICD-10-CM | POA: Diagnosis not present

## 2020-01-05 DIAGNOSIS — R197 Diarrhea, unspecified: Secondary | ICD-10-CM | POA: Diagnosis not present

## 2020-05-27 DIAGNOSIS — S39012A Strain of muscle, fascia and tendon of lower back, initial encounter: Secondary | ICD-10-CM | POA: Diagnosis not present

## 2020-11-24 DIAGNOSIS — L550 Sunburn of first degree: Secondary | ICD-10-CM | POA: Diagnosis not present

## 2020-11-24 DIAGNOSIS — M79671 Pain in right foot: Secondary | ICD-10-CM | POA: Diagnosis not present

## 2021-02-22 ENCOUNTER — Encounter (HOSPITAL_COMMUNITY): Payer: Self-pay | Admitting: Emergency Medicine

## 2021-02-22 ENCOUNTER — Emergency Department (HOSPITAL_COMMUNITY): Payer: Federal, State, Local not specified - PPO

## 2021-02-22 ENCOUNTER — Other Ambulatory Visit: Payer: Self-pay

## 2021-02-22 ENCOUNTER — Emergency Department (HOSPITAL_COMMUNITY)
Admission: EM | Admit: 2021-02-22 | Discharge: 2021-02-22 | Disposition: A | Discharge status: Home / Self Care | Payer: Federal, State, Local not specified - PPO | Attending: Emergency Medicine | Admitting: Emergency Medicine

## 2021-02-22 DIAGNOSIS — Z79899 Other long term (current) drug therapy: Secondary | ICD-10-CM | POA: Diagnosis not present

## 2021-02-22 DIAGNOSIS — E871 Hypo-osmolality and hyponatremia: Secondary | ICD-10-CM | POA: Insufficient documentation

## 2021-02-22 DIAGNOSIS — R55 Syncope and collapse: Secondary | ICD-10-CM | POA: Insufficient documentation

## 2021-02-22 DIAGNOSIS — Z20822 Contact with and (suspected) exposure to covid-19: Secondary | ICD-10-CM | POA: Insufficient documentation

## 2021-02-22 DIAGNOSIS — I10 Essential (primary) hypertension: Secondary | ICD-10-CM | POA: Diagnosis not present

## 2021-02-22 DIAGNOSIS — R197 Diarrhea, unspecified: Secondary | ICD-10-CM | POA: Diagnosis not present

## 2021-02-22 DIAGNOSIS — I959 Hypotension, unspecified: Secondary | ICD-10-CM | POA: Diagnosis not present

## 2021-02-22 DIAGNOSIS — S0990XA Unspecified injury of head, initial encounter: Secondary | ICD-10-CM | POA: Diagnosis not present

## 2021-02-22 LAB — RESP PANEL BY RT-PCR (FLU A&B, COVID) ARPGX2
Influenza A by PCR: NEGATIVE
Influenza B by PCR: NEGATIVE
SARS Coronavirus 2 by RT PCR: NEGATIVE

## 2021-02-22 LAB — CBC
HCT: 38.4 % — ABNORMAL LOW (ref 39.0–52.0)
Hemoglobin: 12.6 g/dL — ABNORMAL LOW (ref 13.0–17.0)
MCH: 31.8 pg (ref 26.0–34.0)
MCHC: 32.8 g/dL (ref 30.0–36.0)
MCV: 97 fL (ref 80.0–100.0)
Platelets: 175 10*3/uL (ref 150–400)
RBC: 3.96 MIL/uL — ABNORMAL LOW (ref 4.22–5.81)
RDW: 12.1 % (ref 11.5–15.5)
WBC: 4.4 10*3/uL (ref 4.0–10.5)
nRBC: 0 % (ref 0.0–0.2)

## 2021-02-22 LAB — RAPID URINE DRUG SCREEN, HOSP PERFORMED
Amphetamines: NOT DETECTED
Barbiturates: NOT DETECTED
Benzodiazepines: NOT DETECTED
Cocaine: NOT DETECTED
Opiates: NOT DETECTED
Tetrahydrocannabinol: NOT DETECTED

## 2021-02-22 LAB — BASIC METABOLIC PANEL
Anion gap: 5 (ref 5–15)
BUN: 11 mg/dL (ref 6–20)
CO2: 26 mmol/L (ref 22–32)
Calcium: 10.4 mg/dL — ABNORMAL HIGH (ref 8.9–10.3)
Chloride: 98 mmol/L (ref 98–111)
Creatinine, Ser: 1.08 mg/dL (ref 0.61–1.24)
GFR, Estimated: >60 mL/min (ref >60)
Glucose, Bld: 101 mg/dL — ABNORMAL HIGH (ref 70–99)
Potassium: 4.3 mmol/L (ref 3.5–5.1)
Sodium: 129 mmol/L — ABNORMAL LOW (ref 135–145)

## 2021-02-22 LAB — URINALYSIS, ROUTINE W REFLEX MICROSCOPIC
Bilirubin Urine: NEGATIVE
Glucose, UA: NEGATIVE mg/dL
Hgb urine dipstick: NEGATIVE
Ketones, ur: NEGATIVE mg/dL
Leukocytes,Ua: NEGATIVE
Nitrite: NEGATIVE
Protein, ur: NEGATIVE mg/dL
Specific Gravity, Urine: 1.02 (ref 1.005–1.030)
pH: 5 (ref 5.0–8.0)

## 2021-02-22 LAB — MAGNESIUM: Magnesium: 2.1 mg/dL (ref 1.7–2.4)

## 2021-02-22 LAB — HEPATIC FUNCTION PANEL
ALT: 21 U/L (ref 0–44)
AST: 44 U/L — ABNORMAL HIGH (ref 15–41)
Albumin: 4.5 g/dL (ref 3.5–5.0)
Alkaline Phosphatase: 39 U/L (ref 38–126)
Bilirubin, Direct: 0.1 mg/dL (ref 0.0–0.2)
Indirect Bilirubin: 0.6 mg/dL (ref 0.3–0.9)
Total Bilirubin: 0.7 mg/dL (ref 0.3–1.2)
Total Protein: 7.6 g/dL (ref 6.5–8.1)

## 2021-02-22 LAB — TSH: TSH: 1.303 u[IU]/mL (ref 0.350–4.500)

## 2021-02-22 LAB — PHOSPHORUS: Phosphorus: 1.8 mg/dL — ABNORMAL LOW (ref 2.5–4.6)

## 2021-02-22 LAB — CBG MONITORING, ED
Glucose-Capillary: 58 mg/dL — ABNORMAL LOW (ref 70–99)
Glucose-Capillary: 80 mg/dL (ref 70–99)

## 2021-02-22 LAB — GROUP A STREP BY PCR: Group A Strep by PCR: NOT DETECTED

## 2021-02-22 MED ORDER — SODIUM CHLORIDE 0.9 % IV BOLUS
1000.0000 mL | Freq: Once | INTRAVENOUS | Status: AC
  Administered 2021-02-22: 12:00:00 1000 mL via INTRAVENOUS

## 2021-02-22 MED ORDER — K PHOS MONO-SOD PHOS DI & MONO 155-852-130 MG PO TABS
500.0000 mg | ORAL_TABLET | Freq: Once | ORAL | Status: AC
  Administered 2021-02-22: 15:00:00 500 mg via ORAL
  Filled 2021-02-22: qty 2

## 2021-02-22 MED ORDER — SODIUM CHLORIDE 0.9 % IV BOLUS
1000.0000 mL | Freq: Once | INTRAVENOUS | Status: AC
  Administered 2021-02-22: 10:00:00 1000 mL via INTRAVENOUS

## 2021-02-22 MED ORDER — K-PHOS-NEUTRAL 155-852-130 MG PO TABS: 1.0000 | ORAL_TABLET | Freq: Three times a day (TID) | ORAL | 0 refills | Status: AC

## 2021-02-22 NOTE — ED Notes (Addendum)
Pt and wife state understanding of dc instructions, importance of follow up, work note, and prescription. Pt and spouse deny questions or concerns upon dc. Pt declined wheelchair assistance upon dc. Pt ambulated out of ed w/ steady gait. No belongings left in room upon dc.

## 2021-02-22 NOTE — Discharge Instructions (Signed)
1.  You need to follow-up with your family doctor soon as possible.  You should have your labs reviewed and further evaluation for possible endocrine disorder.  I recommend you be referred to an endocrinologist if you are lab values do not correct quickly with dietary change. 2.  You are being started on a medication to supplement your phosphorus levels.  Take it 3 times daily as prescribed.  You may also look up recommended list of foods high in phosphorus.  The list includes dairy foods, beans, lentils, nuts, beets, broccoli, mushrooms. 3.  Try to eat a nutritious meal 3 times a day.  You may supplement with salty snacks to help elevate your sodium.  Stay hydrated with beverages that contain electrolytes such as sports drinks or mixed you can make at home with water, orange juice, some sugar and a couple pinches of salt. 4.  Return to emergency department immediately if you continue to feel lightheaded, come close to passing out, feel generally weak or have any worsening symptoms. 5.  Your strep test and your COVID and influenza test were negative.  You report of a sore throat.  The exam is normal you may gargle with salt water.  You may drink hot tea and honey.  If your throat is worsening, if you have any trouble swallowing or breathing, return to emergency department immediately.

## 2021-02-22 NOTE — ED Triage Notes (Addendum)
Arrives via EMS from home, reports getting out of hot shower and walking across the hallway, fell face first into the desk, family heard fall. Pt hit his head on desk. Denies neck pain. Reports feeling sick for the past week.   CBG 92 P 68 O2 99% BP 116/72

## 2021-02-22 NOTE — ED Provider Notes (Signed)
Emergency Medicine Provider Triage Evaluation Note  Christopher Lara , a 45 y.o. male  was evaluated in triage.  Pt complains of syncope.  Review of Systems  Positive: Feeling sick. Lightheaded, fall, facial pain, syncope, sore throat Negative: Headache, cough, dysuria, abd pain, cp  Physical Exam  BP 101/75    Pulse 68    Temp 98.3 F (36.8 C) (Oral)    Resp 16    Ht 5\' 9"  (1.753 m)    Wt 80.7 kg    SpO2 100%    BMI 26.29 kg/m  Gen:   Awake, no distress, pale Resp:  Normal effort  MSK:   Moves extremities without difficulty  Other:  Mild tenderness to L forehead, no signs of trauma  Medical Decision Making  Medically screening exam initiated at 8:36 AM.  Appropriate orders placed.  was informed that the remainder of the evaluation will be completed by another provider, this initial triage assessment does not replace that evaluation, and the importance of remaining in the ED until their evaluation is complete.  Pt report feeling sick for a week.  Got out of the shower today felt lightheaded and had a syncopal episode, fall forward and struck his face.  Endorse sore throat as well.  Appears pale.     [name], PA-C 02/22/21 02/24/21    [id number], MD 03/01/21 [phone]

## 2021-02-22 NOTE — ED Provider Notes (Signed)
[facility]-EMERGENCY DEPT Provider Note   CSN: [id number] Arrival date & time: 02/22/21  0813     History Chief Complaint  Patient presents with   Loss of Consciousness   Fall    Christopher Lara is a 45 y.o. male.  HPI Patient warts that he had taken a hot shower getting ready for work.  He walked out and walked down the hall little ways and lost consciousness.  He reports he did has sensation of things going dark or tunnel vision.  It all happened quickly though and he could not respond.  He ended up losing consciousness and striking his face when he fell.  Patient denies he has any facial pain.  He denies generalized headache.  He denies neck pain.  He denies any preceding symptoms of chest pain, palpitation or headache.  Patient reports he has been a little bit fatigued with loss of appetite over the past 2 days.  Reports he did eat some fruit and dry toast yesterday.  Patient's wife reports he is eating very little for the past 2 days.  He was not vomiting.  He reports this morning he did have fairly copious episode of diarrhea.  He reports he has noticed a very mild sore throat since yesterday.  Patient denies any drug or alcohol use.  No smoking.  Patient considers himself healthy without medical problems.  Patient did have hospitalization approximately a year ago with COVID and significant hyponatremia.  He denies he has been having any ongoing problems since that time.    Past Medical History:  Diagnosis Date   Kidney stone    Kidney stone    Kidney stones    Kidney stones     Patient Active Problem List   Diagnosis Date Noted   Physical debility 03/20/2019   Pneumonia due to COVID-19 virus 03/20/2019   Hyponatremia 03/15/2019    History reviewed. No pertinent surgical history.     Family History  Problem Relation Age of Onset   Heart failure Father    Cancer Brother     Social History   Tobacco Use   Smoking status: Never   Smokeless  tobacco: Never  Substance Use Topics   Alcohol use: No   Drug use: No    Home Medications Prior to Admission medications   Medication Sig Start Date End Date Taking? Authorizing Provider  K Phos Mono-Sod Phos Di & Mono (K-PHOS-NEUTRAL) [phone] MG TABS Take 1 tablet by mouth 3 (three) times daily for 7 days. 02/22/21 03/01/21 Yes Arby Barrette, MD  cyclobenzaprine (FLEXERIL) 10 MG tablet Take 1 tablet (10 mg total) by mouth 3 (three) times daily as needed for muscle spasms. 04/21/19   Arvilla Market, MD  tamsulosin (FLOMAX) 0.4 MG CAPS capsule TAKE 1 CAPSULE BY MOUTH EVERY DAY 07/09/19   McKenzie, Mardene Celeste, MD  omeprazole (PRILOSEC) 20 MG capsule Take 1 capsule (20 mg total) by mouth daily. 30 minutes prior to meal. Patient not taking: Reported on 10/01/2014 09/21/14 11/06/14  Ladona Mow, PA-C    Allergies    Patient has no known allergies.  Review of Systems   Review of Systems 10 systems reviewed and negative except as per HPI Physical Exam Updated Vital Signs BP 116/87    Pulse 73    Temp 98.3 F (36.8 C) (Oral)    Resp 17    Ht 5\' 9"  (1.753 m)    Wt 80.7 kg    SpO2 94%  BMI 26.29 kg/m   Physical Exam Vitals and nursing note reviewed.  Constitutional:      General: He is not in acute distress.    Appearance: Normal appearance. He is well-developed.  HENT:     Head: Normocephalic and atraumatic.     Nose: Nose normal.     Mouth/Throat:     Mouth: Mucous membranes are moist.     Pharynx: Oropharynx is clear.  Eyes:     Extraocular Movements: Extraocular movements intact.  Cardiovascular:     Rate and Rhythm: Normal rate and regular rhythm.     Heart sounds: Normal heart sounds.  Pulmonary:     Effort: Pulmonary effort is normal.     Breath sounds: Normal breath sounds.  Abdominal:     General: Bowel sounds are normal. There is no distension.     Palpations: Abdomen is soft.     Tenderness: There is no abdominal tenderness.  Musculoskeletal:         General: No swelling or tenderness.     Cervical back: Neck supple.     Right lower leg: No edema.     Left lower leg: No edema.  Skin:    General: Skin is warm and dry.  Neurological:     General: No focal deficit present.     Mental Status: He is alert and oriented to person, place, and time.     GCS: GCS eye subscore is 4. GCS verbal subscore is 5. GCS motor subscore is 6.     Cranial Nerves: No cranial nerve deficit.     Motor: No weakness.     Coordination: Coordination normal.    ED Results / Procedures / Treatments   Labs (all labs ordered are listed, but only abnormal results are displayed) Labs Reviewed  BASIC METABOLIC PANEL - Abnormal; Notable for the following components:      Result Value   Sodium 129 (*)    Glucose, Bld 101 (*)    Calcium 10.4 (*)    All other components within normal limits  CBC - Abnormal; Notable for the following components:   RBC 3.96 (*)    Hemoglobin 12.6 (*)    HCT 38.4 (*)    All other components within normal limits  PHOSPHORUS - Abnormal; Notable for the following components:   Phosphorus 1.8 (*)    All other components within normal limits  HEPATIC FUNCTION PANEL - Abnormal; Notable for the following components:   AST 44 (*)    All other components within normal limits  CBG MONITORING, ED - Abnormal; Notable for the following components:   Glucose-Capillary 58 (*)    All other components within normal limits  RESP PANEL BY RT-PCR (FLU A&B, COVID) [id number]  GROUP A STREP BY PCR  URINALYSIS, ROUTINE W REFLEX MICROSCOPIC  MAGNESIUM  TSH  RAPID URINE DRUG SCREEN, HOSP PERFORMED  CBG MONITORING, ED    EKG EKG Interpretation  Date/Time:  Wednesday February 22 2021 08:23:47 EST Ventricular Rate:  69 PR Interval:  192 QRS Duration: 103 QT Interval:  410 QTC Calculation: 440 R Axis:   115 Text Interpretation: Sinus rhythm Probable right ventricular hypertrophy Minimal ST elevation, anterior leads no change from previous  tracing. Confirmed by Arby Barrette [phone]) on 02/22/2021 1:26:36 PM  Radiology CT Head Wo Contrast  Result Date: 02/22/2021 CLINICAL DATA:  Head are trauma EXAM: CT HEAD WITHOUT CONTRAST TECHNIQUE: Contiguous axial images were obtained from the base of the skull through the vertex  without intravenous contrast. COMPARISON:  CT head 03/15/2019 FINDINGS: Brain: There is no evidence of acute intracranial hemorrhage, extra-axial fluid collection, or acute infarct. Parenchymal volume is normal. The ventricles are normal in size. There is no mass lesion. There is no midline shift. Vascular: No hyperdense vessel or unexpected calcification. Skull: Normal. Negative for fracture or focal lesion. Sinuses/Orbits: Imaged paranasal sinuses are clear. The imaged globes and orbits are unremarkable. Other: None. IMPRESSION: Normal head CT. Electronically Signed   By: Lesia Hausen M.D.   On: 02/22/2021 09:20    Procedures Procedures   Medications Ordered in ED Medications  phosphorus (K PHOS NEUTRAL) tablet 500 mg (has no administration in time range)  sodium chloride 0.9 % bolus 1,000 mL (0 mLs Intravenous Stopped 02/22/21 1103)  sodium chloride 0.9 % bolus 1,000 mL (0 mLs Intravenous Stopped 02/22/21 1406)    ED Course  I have reviewed the triage vital signs and the nursing notes.  Pertinent labs & imaging results that were available during my care of the patient were reviewed by me and considered in my medical decision making (see chart for details).    MDM Rules/Calculators/A&P                         Patient presents with limited past medical history.  He did have syncopal episodes this morning.  He fell and struck his head but does not have any facial trauma or generalized headache.  He did not have significant prodromal symptoms but describes this consistent with an episode of orthostatic hypotension.  Patient was in a hot shower and then after getting out subsequently had a syncopal episode.   Patient's past medical history does include a episode of severe hyponatremia requiring ICU hospitalization temporarily.  I will get expanded lab work to include TSH, magnesium, phosphorus to rule out electrolyte abnormality.  Will initiate hydration with a liter normal saline.  Patient has remained asymptomatic.  He does not have positive orthostatic vital signs.  Patient is alert and appropriate without respiratory distress.  No focal weakness.  He does have sore throat.  Rapid strep and COVID are negative.  At this time identified is hypophosphatemia and mild hyponatremia.  Considerations for endocrine etiology versus dietary.  By description, patient's had decreased oral intake for couple of days.  At this time he is not having any vomiting and no difficulty tolerating oral intake.  Reviewed optional plan of admission with observation versus close outpatient follow-up with more aggressive dietary measures at home.  At this time plan will be to prescribe for phosphate supplementation in increased oral intake with attention to foods high in salt and phosphorus.  Careful return precautions reviewed.  Recommendation is for follow-up with PCP for review of labs and if there is not significant correction with dietary measures, urgent referral to endocrinology for further evaluation.   Final Clinical Impression(s) / ED Diagnoses Final diagnoses:  Syncope and collapse  Hypophosphatemia  Hyponatremia    Rx / DC Orders ED Discharge Orders          Ordered    K Phos Mono-Sod Phos Di & Mono (K-PHOS-NEUTRAL) [phone] MG TABS  3 times daily        02/22/21 1426             Arby Barrette, MD 02/22/21 1437

## 2021-02-22 NOTE — ED Notes (Signed)
Provided w/ OJ, crackers and peanut butter, encouraged pt to eat d/t low CBG.

## 2021-04-03 ENCOUNTER — Other Ambulatory Visit: Payer: Self-pay

## 2021-04-03 ENCOUNTER — Ambulatory Visit: Payer: Federal, State, Local not specified - PPO | Admitting: Family Medicine

## 2021-04-03 ENCOUNTER — Encounter: Payer: Self-pay | Admitting: Family Medicine

## 2021-04-03 VITALS — BP 118/80 | HR 72 | Temp 98.1°F | Resp 12 | Ht 66.0 in | Wt 177.0 lb

## 2021-04-03 DIAGNOSIS — R55 Syncope and collapse: Secondary | ICD-10-CM | POA: Diagnosis not present

## 2021-04-03 DIAGNOSIS — E871 Hypo-osmolality and hyponatremia: Secondary | ICD-10-CM | POA: Diagnosis not present

## 2021-04-03 NOTE — Progress Notes (Signed)
° °  Established  Patient Office Visit  Subjective:  Patient ID: Christopher Lara, male    DOB: Mar 16, 1975  Age: 46 y.o. MRN: [id number]  CC:  Chief Complaint  Patient presents with   Hospitalization Follow-up   sycope episode    HPI Christopher Lara presents for follow up of syncope noted about 2 months ago. Patient was seen in the ED. Patient has not had recurrence of symptoms since.   Past Medical History:  Diagnosis Date   Kidney stone    Kidney stone    Kidney stones    Kidney stones     History reviewed. No pertinent surgical history.  Family History  Problem Relation Age of Onset   Heart failure Father    Cancer Brother     Social History   Socioeconomic History   Marital status: Married    Spouse name: Not on file   Number of children: Not on file   Years of education: Not on file   Highest education level: Not on file  Occupational History   Not on file  Tobacco Use   Smoking status: Never   Smokeless tobacco: Never  Substance and Sexual Activity   Alcohol use: No   Drug use: No   Sexual activity: Not on file  Other Topics Concern   Not on file  Social History Narrative   Not on file   Social Determinants of Health   Financial Resource Strain: Not on file  Food Insecurity: Not on file  Transportation Needs: Not on file  Physical Activity: Not on file  Stress: Not on file  Social Connections: Not on file  Intimate Partner Violence: Not on file    ROS Review of Systems  Neurological:  Positive for syncope. Negative for seizures.  All other systems reviewed and are negative.  Objective:   Today's Vitals: BP 118/80 (BP Location: Right Arm, Patient Position: Sitting, Cuff Size: Normal)    Pulse 72    Temp 98.1 F (36.7 C) (Oral)    Resp 12    Ht 5\' 6"  (1.676 m)    Wt 177 lb (80.3 kg)    SpO2 96%    BMI 28.57 kg/m   Physical Exam Vitals and nursing note reviewed.  Constitutional:      General: He is not in acute distress. HENT:     Head:  Normocephalic and atraumatic.     Right Ear: Tympanic membrane normal.     Left Ear: Tympanic membrane normal.  Eyes:     Pupils: Pupils are equal, round, and reactive to light.  Cardiovascular:     Rate and Rhythm: Normal rate and regular rhythm.  Pulmonary:     Effort: Pulmonary effort is normal.     Breath sounds: Normal breath sounds.  Abdominal:     Palpations: Abdomen is soft.     Tenderness: There is no abdominal tenderness.  Neurological:     General: No focal deficit present.     Mental Status: He is alert and oriented to person, place, and time.    Assessment & Plan:    1. Syncope, unspecified syncope type Patient without recurrence of symptoms. Patient defers further eval/mgt at this time.   2. Hyponatremia Monitoring labs ordered - Basic Metabolic Panel    Follow-up: No follow-ups on file.   , MD

## 2021-04-04 LAB — BASIC METABOLIC PANEL
BUN/Creatinine Ratio: 11 (ref 9–20)
BUN: 12 mg/dL (ref 6–24)
CO2: 23 mmol/L (ref 20–29)
Calcium: 11.1 mg/dL — ABNORMAL HIGH (ref 8.7–10.2)
Chloride: 103 mmol/L (ref 96–106)
Creatinine, Ser: 1.11 mg/dL (ref 0.76–1.27)
Glucose: 84 mg/dL (ref 70–99)
Potassium: 4 mmol/L (ref 3.5–5.2)
Sodium: 139 mmol/L (ref 134–144)
eGFR: 82 mL/min/{1.73_m2} (ref >59)

## 2021-04-05 ENCOUNTER — Encounter: Payer: Self-pay | Admitting: Family Medicine

## 2022-02-08 DIAGNOSIS — R197 Diarrhea, unspecified: Secondary | ICD-10-CM | POA: Diagnosis not present

## 2022-04-12 DIAGNOSIS — U071 COVID-19: Secondary | ICD-10-CM | POA: Diagnosis not present

## 2022-04-12 DIAGNOSIS — R059 Cough, unspecified: Secondary | ICD-10-CM | POA: Diagnosis not present

## 2022-04-18 ENCOUNTER — Emergency Department (HOSPITAL_COMMUNITY): Payer: Federal, State, Local not specified - PPO

## 2022-04-18 ENCOUNTER — Inpatient Hospital Stay (HOSPITAL_COMMUNITY)
Admission: EM | Admit: 2022-04-18 | Discharge: 2022-04-21 | DRG: 644 | Disposition: A | Discharge status: Home / Self Care | Payer: Federal, State, Local not specified - PPO | Attending: Internal Medicine | Admitting: Internal Medicine

## 2022-04-18 ENCOUNTER — Encounter (HOSPITAL_COMMUNITY): Payer: Self-pay

## 2022-04-18 ENCOUNTER — Inpatient Hospital Stay (HOSPITAL_COMMUNITY): Payer: Federal, State, Local not specified - PPO

## 2022-04-18 ENCOUNTER — Inpatient Hospital Stay: Payer: Self-pay

## 2022-04-18 ENCOUNTER — Other Ambulatory Visit: Payer: Self-pay

## 2022-04-18 DIAGNOSIS — U099 Post covid-19 condition, unspecified: Secondary | ICD-10-CM | POA: Diagnosis not present

## 2022-04-18 DIAGNOSIS — R066 Hiccough: Secondary | ICD-10-CM | POA: Diagnosis not present

## 2022-04-18 DIAGNOSIS — I3139 Other pericardial effusion (noninflammatory): Secondary | ICD-10-CM

## 2022-04-18 DIAGNOSIS — Z87442 Personal history of urinary calculi: Secondary | ICD-10-CM

## 2022-04-18 DIAGNOSIS — R631 Polydipsia: Secondary | ICD-10-CM | POA: Diagnosis not present

## 2022-04-18 DIAGNOSIS — E871 Hypo-osmolality and hyponatremia: Principal | ICD-10-CM | POA: Diagnosis present

## 2022-04-18 DIAGNOSIS — E222 Syndrome of inappropriate secretion of antidiuretic hormone: Secondary | ICD-10-CM | POA: Diagnosis not present

## 2022-04-18 DIAGNOSIS — M6282 Rhabdomyolysis: Secondary | ICD-10-CM | POA: Diagnosis present

## 2022-04-18 DIAGNOSIS — E878 Other disorders of electrolyte and fluid balance, not elsewhere classified: Secondary | ICD-10-CM | POA: Diagnosis not present

## 2022-04-18 DIAGNOSIS — R531 Weakness: Secondary | ICD-10-CM | POA: Diagnosis not present

## 2022-04-18 DIAGNOSIS — R9431 Abnormal electrocardiogram [ECG] [EKG]: Secondary | ICD-10-CM | POA: Diagnosis not present

## 2022-04-18 DIAGNOSIS — J90 Pleural effusion, not elsewhere classified: Secondary | ICD-10-CM | POA: Diagnosis not present

## 2022-04-18 DIAGNOSIS — R001 Bradycardia, unspecified: Secondary | ICD-10-CM | POA: Diagnosis not present

## 2022-04-18 DIAGNOSIS — Z8249 Family history of ischemic heart disease and other diseases of the circulatory system: Secondary | ICD-10-CM | POA: Diagnosis not present

## 2022-04-18 LAB — COMPREHENSIVE METABOLIC PANEL
ALT: 45 U/L — ABNORMAL HIGH (ref 0–44)
AST: 217 U/L — ABNORMAL HIGH (ref 15–41)
Albumin: 4.6 g/dL (ref 3.5–5.0)
Alkaline Phosphatase: 42 U/L (ref 38–126)
Anion gap: 11 (ref 5–15)
BUN: 6 mg/dL (ref 6–20)
CO2: 18 mmol/L — ABNORMAL LOW (ref 22–32)
Calcium: 8.9 mg/dL (ref 8.9–10.3)
Chloride: 76 mmol/L — ABNORMAL LOW (ref 98–111)
Creatinine, Ser: 0.84 mg/dL (ref 0.61–1.24)
GFR, Estimated: >60 mL/min (ref >60)
Glucose, Bld: 105 mg/dL — ABNORMAL HIGH (ref 70–99)
Potassium: 3.7 mmol/L (ref 3.5–5.1)
Sodium: 105 mmol/L — CL (ref 135–145)
Total Bilirubin: 1 mg/dL (ref 0.3–1.2)
Total Protein: 8.1 g/dL (ref 6.5–8.1)

## 2022-04-18 LAB — ECHOCARDIOGRAM COMPLETE
Area-P 1/2: 2.64 cm2
Height: 66 in
S' Lateral: 2 cm
Weight: 2797.2 oz

## 2022-04-18 LAB — CBC WITH DIFFERENTIAL/PLATELET
Abs Immature Granulocytes: 0.05 10*3/uL (ref 0.00–0.07)
Basophils Absolute: 0 10*3/uL (ref 0.0–0.1)
Basophils Relative: 0 %
Eosinophils Absolute: 0.1 10*3/uL (ref 0.0–0.5)
Eosinophils Relative: 1 %
HCT: 36.6 % — ABNORMAL LOW (ref 39.0–52.0)
Hemoglobin: 13.8 g/dL (ref 13.0–17.0)
Immature Granulocytes: 1 %
Lymphocytes Relative: 31 %
Lymphs Abs: 2.1 10*3/uL (ref 0.7–4.0)
MCH: 31.5 pg (ref 26.0–34.0)
MCHC: 37.7 g/dL — ABNORMAL HIGH (ref 30.0–36.0)
MCV: 83.6 fL (ref 80.0–100.0)
Monocytes Absolute: 0.3 10*3/uL (ref 0.1–1.0)
Monocytes Relative: 5 %
Neutro Abs: 4.2 10*3/uL (ref 1.7–7.7)
Neutrophils Relative %: 62 %
Platelets: 198 10*3/uL (ref 150–400)
RBC: 4.38 MIL/uL (ref 4.22–5.81)
RDW: 11.7 % (ref 11.5–15.5)
WBC: 6.7 10*3/uL (ref 4.0–10.5)
nRBC: 0 % (ref 0.0–0.2)

## 2022-04-18 LAB — BRAIN NATRIURETIC PEPTIDE: B Natriuretic Peptide: 36.4 pg/mL (ref 0.0–100.0)

## 2022-04-18 LAB — CK: Total CK: 15085 U/L — ABNORMAL HIGH (ref 49–397)

## 2022-04-18 LAB — OSMOLALITY, URINE
Osmolality, Ur: 307 mOsm/kg (ref 300–900)
Osmolality, Ur: 297 mOsm/kg — ABNORMAL LOW (ref 300–900)

## 2022-04-18 LAB — TROPONIN I (HIGH SENSITIVITY): Troponin I (High Sensitivity): 6 ng/L (ref <18)

## 2022-04-18 LAB — MRSA NEXT GEN BY PCR, NASAL: MRSA by PCR Next Gen: NOT DETECTED

## 2022-04-18 LAB — SODIUM
Sodium: 108 mmol/L — CL (ref 135–145)
Sodium: 113 mmol/L — CL (ref 135–145)
Sodium: 108 mmol/L — CL (ref 135–145)
Sodium: 110 mmol/L — CL (ref 135–145)
Sodium: 115 mmol/L — CL (ref 135–145)

## 2022-04-18 LAB — OSMOLALITY: Osmolality: 228 mOsm/kg — CL (ref 275–295)

## 2022-04-18 LAB — HIV ANTIBODY (ROUTINE TESTING W REFLEX): HIV Screen 4th Generation wRfx: NONREACTIVE

## 2022-04-18 LAB — SODIUM, URINE, RANDOM
Sodium, Ur: 68 mmol/L
Sodium, Ur: 48 mmol/L

## 2022-04-18 MED ORDER — POLYETHYLENE GLYCOL 3350 17 G PO PACK: 17.0000 g | PACK | Freq: Every day | ORAL | Status: DC | PRN

## 2022-04-18 MED ORDER — DOCUSATE SODIUM 100 MG PO CAPS: 100.0000 mg | ORAL_CAPSULE | Freq: Two times a day (BID) | ORAL | Status: DC | PRN

## 2022-04-18 MED ORDER — SODIUM CHLORIDE 0.9 % IV SOLN: INTRAVENOUS | Status: DC

## 2022-04-18 MED ORDER — SODIUM CHLORIDE 0.9 % IV BOLUS
1000.0000 mL | Freq: Once | INTRAVENOUS | Status: AC
  Administered 2022-04-18: 1000 mL via INTRAVENOUS

## 2022-04-18 MED ORDER — ORAL CARE MOUTH RINSE: 15.0000 mL | OROMUCOSAL | Status: DC | PRN

## 2022-04-18 MED ORDER — HEPARIN SODIUM (PORCINE) 5000 UNIT/ML IJ SOLN
5000.0000 [IU] | Freq: Three times a day (TID) | INTRAMUSCULAR | Status: DC
  Administered 2022-04-18 – 2022-04-19 (×3): 5000 [IU] via SUBCUTANEOUS
  Filled 2022-04-18 (×3): qty 1

## 2022-04-18 MED ORDER — CHLORHEXIDINE GLUCONATE CLOTH 2 % EX PADS
6.0000 | MEDICATED_PAD | Freq: Every day | CUTANEOUS | Status: DC
  Administered 2022-04-19: 6 via TOPICAL

## 2022-04-18 MED ORDER — SODIUM CHLORIDE 3 % IV SOLN
INTRAVENOUS | Status: DC
  Filled 2022-04-18 (×2): qty 500

## 2022-04-18 NOTE — Progress Notes (Signed)
PCCM Interval Progress Note  Na 108  Will give 1L NS bolus followed by maintenance at 125/hr.  Follow Na.   Montey Hora, [facility] For pager details, please see AMION or use Epic chat  After 1900, please call [facility] for cross coverage needs 04/18/2022, 1:59 PM

## 2022-04-18 NOTE — ED Triage Notes (Signed)
Pt BIB EMS from home after wife stated he was "disoriented". Per EMS, pt stated his "muscles are feeling tight"   140 palpated 64 hr 98% RA

## 2022-04-18 NOTE — ED Provider Notes (Signed)
[place] [facility] Provider Note   CSN: [id number] Arrival date & time: 04/18/22  0421     History  Chief Complaint  Patient presents with   Muscle Pain    Christopher Lara is a 47 y.o. male.  The history is provided by the patient and medical records.  Muscle Pain   47 y.o. M presenting to the ED with feeling poorly.  Recent covid-19 infection 04/12/22, ended quarantine yesterday.  Wife called EMS as he seemed disoriented, however AAOx3 with EMS.  He complains of having hiccups and generalized muscle soreness in his arms/legs.  He denies any falls/trauma.  No fever.  No cough or SOB.  No chest pain.  Home Medications Prior to Admission medications   Medication Sig Start Date End Date Taking? Authorizing Provider  cyclobenzaprine (FLEXERIL) 10 MG tablet Take 1 tablet (10 mg total) by mouth 3 (three) times daily as needed for muscle spasms. Patient not taking: Reported on 04/03/2021 04/21/19   Nicolette Bang, MD  tamsulosin (FLOMAX) 0.4 MG CAPS capsule TAKE 1 CAPSULE BY MOUTH EVERY DAY Patient not taking: Reported on 04/03/2021 07/09/19   Cleon Gustin, MD  omeprazole (PRILOSEC) 20 MG capsule Take 1 capsule (20 mg total) by mouth daily. 30 minutes prior to meal. Patient not taking: Reported on 10/01/2014 09/21/14 11/06/14  Dahlia Bailiff, PA-C      Allergies    Patient has no known allergies.    Review of Systems   Review of Systems  Musculoskeletal:  Positive for myalgias.  All other systems reviewed and are negative.   Physical Exam Updated Vital Signs BP (!) 147/102   Pulse 68   Temp 97.6 F (36.4 C) (Oral)   SpO2 100%   Physical Exam Vitals and nursing note reviewed.  Constitutional:      Appearance: He is well-developed.     Comments: Hiccupping during exam  HENT:     Head: Normocephalic and atraumatic.  Eyes:     Conjunctiva/sclera: Conjunctivae normal.     Pupils: Pupils are equal, round, and reactive to light.   Cardiovascular:     Rate and Rhythm: Normal rate and regular rhythm.     Heart sounds: Normal heart sounds.  Pulmonary:     Effort: Pulmonary effort is normal.     Breath sounds: Normal breath sounds.  Abdominal:     General: Bowel sounds are normal.     Palpations: Abdomen is soft.  Musculoskeletal:        General: Normal range of motion.     Cervical back: Normal range of motion.  Skin:    General: Skin is warm and dry.  Neurological:     Mental Status: He is alert and oriented to person, place, and time.     Comments: Seems a bit sluggish and slower to respond but AAOx3, moving extremities when prompted, no focal deficits     ED Results / Procedures / Treatments   Labs (all labs ordered are listed, but only abnormal results are displayed) Labs Reviewed  COMPREHENSIVE METABOLIC PANEL - Abnormal; Notable for the following components:      Result Value   Sodium 105 (*)    Chloride 76 (*)    CO2 18 (*)    Glucose, Bld 105 (*)    AST 217 (*)    ALT 45 (*)    All other components within normal limits  CK - Abnormal; Notable for the following components:  Total CK 15,085 (*)    All other components within normal limits  CBC WITH DIFFERENTIAL/PLATELET  BRAIN NATRIURETIC PEPTIDE  SODIUM  SODIUM  SODIUM  SODIUM  SODIUM  OSMOLALITY  OSMOLALITY, URINE  SODIUM, URINE, RANDOM  TROPONIN I (HIGH SENSITIVITY)    EKG None  Radiology DG Chest 2 View  Result Date: 04/18/2022 CLINICAL DATA:  47 year old male with recent COVID-19. weakness, [place]. EXAM: CHEST - 2 VIEW COMPARISON:  Portable chest 03/15/2019. CT Abdomen and Pelvis 01/13/2020 FINDINGS: Semi upright AP and lateral views at 0456 hours. New cardiomegaly since 2021. Possible pericardial effusion. Other mediastinal contours are within normal limits. Visualized tracheal air column is within normal limits. Stable low lung volumes. Allowing for portable technique the lungs are clear. No pulmonary edema or pleural effusion.  Chronic T10 anterior wedge compression fracture was present on the previous CT Abdomen and Pelvis. No acute osseous abnormality identified. Negative visible bowel gas. IMPRESSION: 1. Cardiomegaly is new since 2021, consider pericardial effusion. 2. No other acute cardiopulmonary abnormality. 3. Chronic T10 compression fracture. Electronically Signed   By: Genevie Ann M.D.   On: 04/18/2022 05:35    Procedures Procedures    CRITICAL CARE Performed by: Larene Pickett   Total critical care time: 45 minutes  Critical care time was exclusive of separately billable procedures and treating other patients.  Critical care was necessary to treat or prevent imminent or life-threatening deterioration.  Critical care was time spent personally by me on the following activities: development of treatment plan with patient and/or surrogate as well as nursing, discussions with consultants, evaluation of patient's response to treatment, examination of patient, obtaining history from patient or surrogate, ordering and performing treatments and interventions, ordering and review of laboratory studies, ordering and review of radiographic studies, pulse oximetry and re-evaluation of patient's condition.   Medications Ordered in ED Medications  sodium chloride (hypertonic) 3 % solution (has no administration in time range)  sodium chloride 0.9 % bolus 1,000 mL (1,000 mLs Intravenous Bolus 04/18/22 0503)    ED Course/ Medical Decision Making/ A&P                             Medical Decision Making Amount and/or Complexity of Data Reviewed Labs: ordered. Radiology: ordered and independent interpretation performed. ECG/medicine tests: ordered and independent interpretation performed.  Risk Prescription drug management.   47 year old male presenting to the ED with generalized feeling of unwell.  Recent COVID-19 infection, got off quarantine yesterday.  He states his muscles are aching and he has been having  frequent hiccups.  He denies any frank chest pain or shortness of breath.  No cough.  EMS reported wife stated that he was altered, he is slow to respond here and somewhat sluggish but he is not altered or disoriented.  He is answering all questions appropriately and following commands.  He is moving his extremities well without focal deficits.  We will check labs, chest x-ray, EKG.  5:48 AM Notified of critical Na+ of 105.  Wife reports hx of hyponatremia with his last bout of covid a few years ago, was in the hospital for about a week.  He has had excessive water intake recently..  No seizure activity.  LFT's also elevated-- AST 217, ALT 45.  CK 15,085.  CXR with new cardiomegaly since 2021.  Bedside US performed-- does have evidence of large pericardiac effusion, no findings of tamponade.  Images archived.  Started hypertonic  saline for his severe hyponatremia.  He will require ICU admission.  6:00 AM Discussed with Dr. Lynetta Mare-- will admit to CCU at [facility].  He is placing bed order request.  He agrees with hypertonic saline orders.  We will start arranging transport via [place].  Final Clinical Impression(s) / ED Diagnoses Final diagnoses:  Hyponatremia  Pericardial effusion    Rx / DC Orders ED Discharge Orders     None         Larene Pickett, PA-C 04/18/22 [place], [place], DO 04/18/22 [phone]

## 2022-04-18 NOTE — Consult Note (Addendum)
Cardiology Consultation   Patient ID: Christopher Lara MRN: [id number]; DOB: [birth year]  Admit date: 04/18/2022 Date of Consult: 04/18/2022  PCP:  Nicolette Bang, MD (Inactive)   [facility] Cardiologist:  Dr. Martinique  Patient Profile:   Christopher Lara is a 47 y.o. male with a history of severe hyponatremia in setting of COVID pneumonia in 02/2019 but no other significant past medical history who was admitted on 04/18/2022 for severe hyponatremia after presenting with altered mental status. He was also incidentally found to have a large pericardial effusion for which Cardiology has been asked to see for at the request of Dr. Tacy Learn.   History of Present Illness:   Christopher Lara is a 47 year old male with no prior cardiac history. He was seen at the [facility] ED in 01/2021 for a syncopal episode that occurred after he took a hot shower. He developed tunnel vision and had the sensation that things were going dark and then passed out and fell hitting his face during the fall. He also reported decreased oral intake for a couple of days prior to this event. Work-up revealed hypophosphatemia and mild hyponatremia but was otherwise unremarkable. He was prescribed phosphate supplementation and advised to follow-up with PCP.  Patient presented to the [facility] ED today for further evaluation of altered mental status and generalized muscle soreness. Upon arrival to the ED, patient hypertensive but otherwise vitals stable. EKG showed normal sinus rhythm, rate 67 bpm, with non-specific T wave changes and prolonged QTc of 523 ms. High-sensitivity troponin negative. WBC 6.7, Hgb 13.8, Plts 198. Na markedly low 105, K 3.7, Glucose 105, BUN 6, Cr 0.84. AST 217, ALT 45, Alk Phos 42, Total Bili 1.0. CK markedly elevated at 15,085. BNP normal. Chest x-ray showed new cardiomegaly but no other acute findings. He was started on 3% normal saline and transferred to [facility] and admitted to  the ICU. Upon arrival to [facility], Na up to 113 and 3% normal saline was stopped.   Echo showed LVEF fo 70-75% with grade 2 diastolic dysfunction and a large pericardial effusion measuring up to 3.85 cm with mild RV diastolic collapse and respiratory flow variation across the tricuspid valve but not the mitral valve. Current finding not consistent with cardiac tamponade but felt to be at high risk for developing tamponade. Cardiology was consulted for further evaluation.   At the time of this evaluation, patient is resting comfortably in no acute distress. He is alert and oriented but has a very flat affect. There is currently no family at bedside but RN states that wife reported that is not usual for him. He states he was diagnosed with COVID on 04/12/2022 and had fever and nasal congestion with this. He was seen by PCP and treated with Paxlovid. However, he reports no significant improvement with this. He then started to have some muscle tightness in his legs and arms. Per H&P, wife noted that he seemed to be confused and disoriented on 04/17/2022. EMS was ultimately called. He denies any cardiac symptoms. No chest pain, shortness of breath, orthopnea, PND, edema, palpitations, lightheadedness, dizziness, or syncope. No GI symptoms or abnormal bleeding. Patient does report decreased PO intake with recent COVID infection due to decreased appetite.  Of note, patient had a similar admission in 02/2019. He was admitted for severe hyponatremia with Na as low as 108 with altered mental status in the setting of COVID pneumonia.   Past Medical History:  Diagnosis  Date   Kidney stone    Kidney stone    Kidney stones    Kidney stones     History reviewed. No pertinent surgical history.   Home Medications:  Prior to Admission medications   Medication Sig Start Date End Date Taking? Authorizing Provider  acetaminophen (TYLENOL) 650 MG CR tablet Take 1,300 mg by mouth every 4 (four) hours as needed for  pain.   Yes [provider]  ibuprofen (ADVIL) 800 MG tablet Take 800 mg by mouth every 4 (four) hours as needed for moderate pain. 04/12/22  Yes [provider]  promethazine-dextromethorphan (PROMETHAZINE-DM) 6.25-15 MG/5ML syrup Take by mouth as needed for cough. 04/12/22  Yes [provider]  benzonatate (TESSALON) 200 MG capsule Take by mouth. Patient not taking: Reported on 04/18/2022 04/12/22 04/19/22  [provider]  PAXLOVID, 300/100, 20 x 150 MG & 10 x 100MG TBPK Take by mouth. Patient not taking: Reported on 04/18/2022 04/12/22   [provider]  tamsulosin (FLOMAX) 0.4 MG CAPS capsule TAKE 1 CAPSULE BY MOUTH EVERY DAY Patient not taking: Reported on 04/03/2021 07/09/19   Cleon Gustin, MD  omeprazole (PRILOSEC) 20 MG capsule Take 1 capsule (20 mg total) by mouth daily. 30 minutes prior to meal. Patient not taking: Reported on 10/01/2014 09/21/14 11/06/14  Dahlia Bailiff, PA-C    Inpatient Medications: Scheduled Meds:  heparin  5,000 Units Subcutaneous Q8H   Continuous Infusions:  sodium chloride     PRN Meds: docusate sodium, mouth rinse, polyethylene glycol  Allergies:   No Known Allergies  Social History:   Social History   Socioeconomic History   Marital status: Married    Spouse name: Not on file   Number of children: Not on file   Years of education: Not on file   Highest education level: Not on file  Occupational History   Not on file  Tobacco Use   Smoking status: Never   Smokeless tobacco: Never  Substance and Sexual Activity   Alcohol use: No   Drug use: No   Sexual activity: Not on file  Other Topics Concern   Not on file  Social History Narrative   Not on file   Social Determinants of Health   Financial Resource Strain: Not on file  Food Insecurity: No Food Insecurity (04/18/2022)   Hunger Vital Sign    Worried About Running Out of Food in the Last Year: Never true    Ran Out of Food in the Last Year: Never  true  Transportation Needs: No Transportation Needs (04/18/2022)   PRAPARE - Hydrologist (Medical): No    Lack of Transportation (Non-Medical): No  Physical Activity: Not on file  Stress: Not on file  Social Connections: Not on file  Intimate Partner Violence: Not At Risk (04/18/2022)   Humiliation, Afraid, Rape, and Kick questionnaire    Fear of Current or Ex-Partner: No    Emotionally Abused: No    Physically Abused: No    Sexually Abused: No    Family History:   Family History  Problem Relation Age of Onset   Heart failure Father    Cancer Brother      ROS:  Please see the history of present illness.  Review of Systems  Constitutional:  Positive for fever.  HENT:  Positive for congestion.   Respiratory:  Negative for shortness of breath.   Cardiovascular:  Negative for chest pain, palpitations, orthopnea, leg swelling and PND.  Gastrointestinal:  Negative for blood in stool, nausea and vomiting.  Genitourinary:  Negative for hematuria.  Musculoskeletal:  Negative for falls. Myalgias: muscle tightness. Neurological:  Negative for dizziness and loss of consciousness.  Endo/Heme/Allergies:  Does not bruise/bleed easily.  Psychiatric/Behavioral:  Negative for substance abuse.     Physical Exam/Data:   Vitals:   04/18/22 1100 04/18/22 1200 04/18/22 1300 04/18/22 1400  BP: (!) 149/102 (!) 140/91 (!) 122/94 (!) 137/97  Pulse: 66 74 69 72  Resp: 17 16 18 19  $ Temp:      TempSrc:      SpO2: 100% 100% 100% 99%  Weight:      Height:        Intake/Output Summary (Last 24 hours) at 04/18/2022 1445 Last data filed at 04/18/2022 1300 Gross per 24 hour  Intake 422.99 ml  Output 600 ml  Net -177.01 ml      04/18/2022   10:45 AM 04/03/2021    3:03 PM 02/22/2021    8:18 AM  Last 3 Weights  Weight (lbs) 174 lb 13.2 oz 177 lb 178 lb  Weight (kg) 79.3 kg 80.287 kg 80.74 kg     Body mass index is 28.22 kg/m.  General: 47 y.o. Caucasian male  resting comfortably in no acute distress. HEENT: Normocephalic and atraumatic. Sclera clear. EOMs intact. Neck: Supple. No JVD. Heart: RRR. Distinct S1 and S2. No murmurs, gallops, or rubs. Radial pulses 2+ and equal bilaterally. Lungs: No increased work of breathing. Clear to ausculation bilaterally. No wheezes, rhonchi, or rales.  Abdomen: Soft, non-distended, and non-tender to palpation. Bowel sounds present. Extremities: No lower extremity edema.    Skin: Warm and dry. Neuro: Alert and oriented x3. No focal deficits. Psych: Normal affect. Responds appropriately.   EKG:  The EKG was personally reviewed and demonstrates:  Normal sinus rhythm, rate 67 bpm, with non-specific T wave changes and prolonged QTc of 523 ms.  Telemetry:  Telemetry was personally reviewed and demonstrates:  Normal sinus rhythm with rates in the 60s to 70s.  Relevant CV Studies:  Echocardiogram 04/18/2022: Impressions: 1. Left ventricular ejection fraction, by estimation, is 70 to 75%. The  left ventricle has hyperdynamic function. The left ventricle has no  regional wall motion abnormalities. There is mild concentric left  ventricular hypertrophy. Left ventricular  diastolic parameters are consistent with Grade II diastolic dysfunction  (pseudonormalization). Elevated left ventricular end-diastolic pressure.   2. Right ventricular systolic function is normal. The right ventricular  size is normal.   3. Very large pericardial effusion measuring up to 3.85 cm. There is mild  RV diastolic collapse and respiratory flow variation across the tricuspid  valve but not the mitral valve. The IVC is normal in size and collapses  normally. Recommend clinical  correlation. Current findings are not consistent with tamponade, but  patient is at high risk for developing tamponade. Large pericardial  effusion. The pericardial effusion is circumferential.   4. The mitral valve is normal in structure. No evidence of mitral  valve  regurgitation. No evidence of mitral stenosis.   5. The aortic valve is normal in structure. Aortic valve regurgitation is  not visualized. No aortic stenosis is present.   6. The inferior vena cava is normal in size with greater than 50%  respiratory variability, suggesting right atrial pressure of 3 mmHg.    Laboratory Data:  High Sensitivity Troponin:   Recent Labs  Lab 04/18/22 0430  TROPONINIHS 6     Chemistry Recent Labs  Lab 04/18/22 0430 04/18/22 0554 04/18/22 0754 04/18/22 1222  NA 105* 108* 113* 108*  K 3.7  --   --   --   CL 76*  --   --   --   CO2 18*  --   --   --   GLUCOSE 105*  --   --   --   BUN 6  --   --   --   CREATININE 0.84  --   --   --   CALCIUM 8.9  --   --   --   GFRNONAA >60  --   --   --   ANIONGAP 11  --   --   --     Recent Labs  Lab 04/18/22 0430  PROT 8.1  ALBUMIN 4.6  AST 217*  ALT 45*  ALKPHOS 42  BILITOT 1.0   Lipids No results for input(s): "CHOL", "TRIG", "HDL", "LABVLDL", "LDLCALC", "CHOLHDL" in the last 168 hours.  Hematology Recent Labs  Lab 04/18/22 0430  WBC 6.7  RBC 4.38  HGB 13.8  HCT 36.6*  MCV 83.6  MCH 31.5  MCHC 37.7*  RDW 11.7  PLT 198   Thyroid No results for input(s): "TSH", "FREET4" in the last 168 hours.  BNP Recent Labs  Lab 04/18/22 0430  BNP 36.4    DDimer No results for input(s): "DDIMER" in the last 168 hours.   Radiology/Studies:  ECHOCARDIOGRAM COMPLETE  Result Date: 04/18/2022    ECHOCARDIOGRAM REPORT   Patient Name:   TAJON PRESTAGE Date of Exam: 04/18/2022 Medical Rec #:  [id number]      Height:       66.0 in Accession #:    [id number]     Weight:       174.8 lb Date of Birth:  1976/01/24      BSA:          1.889 m Patient Age:    35 years       BP:           149/102 mmHg Patient Gender: M              HR:           67 bpm. Exam Location:  Inpatient Procedure: 2D Echo, Cardiac Doppler and Color Doppler Indications:    Pericardial effusion  History:        Patient has no prior  history of Echocardiogram examinations.  Sonographer:    Meagan Baucom RDCS, FE, PE Referring Phys: [id number] RAHUL P DESAI IMPRESSIONS  1. Left ventricular ejection fraction, by estimation, is 70 to 75%. The left ventricle has hyperdynamic function. The left ventricle has no regional wall motion abnormalities. There is mild concentric left ventricular hypertrophy. Left ventricular diastolic parameters are consistent with Grade II diastolic dysfunction (pseudonormalization). Elevated left ventricular end-diastolic pressure.  2. Right ventricular systolic function is normal. The right ventricular size is normal.  3. Very large pericardial effusion measuring up to 3.85 cm. There is mild RV diastolic collapse and respiratory flow variation across the tricuspid valve but not the mitral valve. The IVC is normal in size and collapses normally. Recommend clinical correlation. Current findings are not consistent with tamponade, but patient is at high risk for developing tamponade. Large pericardial effusion. The pericardial effusion is circumferential.  4. The mitral valve is normal in structure. No evidence of mitral valve regurgitation. No evidence of mitral stenosis.  5. The aortic valve is normal in structure. Aortic valve  regurgitation is not visualized. No aortic stenosis is present.  6. The inferior vena cava is normal in size with greater than 50% respiratory variability, suggesting right atrial pressure of 3 mmHg. FINDINGS  Left Ventricle: Left ventricular ejection fraction, by estimation, is 70 to 75%. The left ventricle has hyperdynamic function. The left ventricle has no regional wall motion abnormalities. The left ventricular internal cavity size was normal in size. There is mild concentric left ventricular hypertrophy. Left ventricular diastolic parameters are consistent with Grade II diastolic dysfunction (pseudonormalization). Elevated left ventricular end-diastolic pressure. Right Ventricle: The right  ventricular size is normal. No increase in right ventricular wall thickness. Right ventricular systolic function is normal. Left Atrium: Left atrial size was normal in size. Right Atrium: Right atrial size was normal in size. Pericardium: Very large pericardial effusion measuring up to 3.85 cm. There is mild RV diastolic collapse and respiratory flow variation across the tricuspid valve but not the mitral valve. The IVC is normal in size and collapses normally. Recommend clinical correlation. Current findings are not consistent with tamponade, but patient is at high risk for developing tamponade. A large pericardial effusion is present. The pericardial effusion is circumferential. There is diastolic collapse of the right  atrial wall and excessive respiratory variation in the tricuspid valve spectral Doppler velocities. Mitral Valve: The mitral valve is normal in structure. No evidence of mitral valve regurgitation. No evidence of mitral valve stenosis. Tricuspid Valve: The tricuspid valve is normal in structure. Tricuspid valve regurgitation is not demonstrated. No evidence of tricuspid stenosis. Aortic Valve: The aortic valve is normal in structure. Aortic valve regurgitation is not visualized. No aortic stenosis is present. Pulmonic Valve: The pulmonic valve was normal in structure. Pulmonic valve regurgitation is not visualized. No evidence of pulmonic stenosis. Aorta: The aortic root is normal in size and structure. Venous: The inferior vena cava is normal in size with greater than 50% respiratory variability, suggesting right atrial pressure of 3 mmHg. IAS/Shunts: No atrial level shunt detected by color flow Doppler.  LEFT VENTRICLE PLAX 2D LVIDd:         3.60 cm   Diastology LVIDs:         2.00 cm   LV e' medial:   3.42 cm/s LV PW:         1.20 cm   LV E/e' medial: 20.7 LV IVS:        1.10 cm LVOT diam:     2.20 cm LV SV:         75 LV SV Index:   40 LVOT Area:     3.80 cm  RIGHT VENTRICLE RV S prime:      8.24 cm/s LEFT ATRIUM           Index        RIGHT ATRIUM           Index LA Vol (A2C): 25.3 ml 13.40 ml/m  RA Area:     11.00 cm LA Vol (A4C): 34.6 ml 18.32 ml/m  RA Volume:   22.70 ml  12.02 ml/m  AORTIC VALVE LVOT Vmax:   92.10 cm/s LVOT Vmean:  60.000 cm/s LVOT VTI:    0.197 m  AORTA Ao Root diam: 3.20 cm Ao Asc diam:  3.00 cm MITRAL VALVE MV Area (PHT): 2.64 cm    SHUNTS MV Decel Time: 287 msec    Systemic VTI:  0.20 m MV E velocity: 70.70 cm/s  Systemic Diam: 2.20 cm MV A velocity: 53.10 cm/s  MV E/A ratio:  1.33 Skeet Latch MD Electronically signed by Skeet Latch MD Signature Date/Time: 04/18/2022/11:48:43 AM    Final    [place] EKG SITE RITE  Result Date: 04/18/2022 If Site Rite image not attached, placement could not be confirmed due to current cardiac rhythm.  DG Chest 2 View  Result Date: 04/18/2022 CLINICAL DATA:  47 year old male with recent COVID-19. weakness, [place]. EXAM: CHEST - 2 VIEW COMPARISON:  Portable chest 03/15/2019. CT Abdomen and Pelvis 01/13/2020 FINDINGS: Semi upright AP and lateral views at 0456 hours. New cardiomegaly since 2021. Possible pericardial effusion. Other mediastinal contours are within normal limits. Visualized tracheal air column is within normal limits. Stable low lung volumes. Allowing for portable technique the lungs are clear. No pulmonary edema or pleural effusion. Chronic T10 anterior wedge compression fracture was present on the previous CT Abdomen and Pelvis. No acute osseous abnormality identified. Negative visible bowel gas. IMPRESSION: 1. Cardiomegaly is new since 2021, consider pericardial effusion. 2. No other acute cardiopulmonary abnormality. 3. Chronic T10 compression fracture. Electronically Signed   By: Genevie Ann M.D.   On: 04/18/2022 05:35     Assessment and Plan:   Large Pericardial Effusion Patient was admitted with severe hyponatremia after presenting with altered mental status. Echo showed normal LV function with large pericardial  effusion measuring up to 3.85 cm with mild RV diastolic collapse and respiratory flow variation across the tricuspid valve but not the mitral valve. IVC is normal and size and collapses normally. Current findings not consistent with tamponade but he is at high risk for developing tamponade.  - Hemodynamically stable. - Pericardial effusion possibly due to recent COVID infection. Will likely need pericardiocentesis. Will review and discuss timing of this with MD.  Prolonged QTc  QTc 523 ms today. Likely due to severe hyponatremia. Potassium 3.7. Magnesium pending. - Repeat EKG when hyponatremia has improved.  Severe Hyponatremia Sodium 105 on admission. 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  Being treated with NaCl.  - Management per PCCM.  Rhabdomyolysis  CK elevated at 15,086. Being treated with IV fluids. - Management per PCCM.   Risk Assessment/Risk Scores:    For questions or updates, please contact [place] Please consult [email or web address] for contact info under    Signed, Darreld Mclean, PA-C  04/18/2022 2:45 PM

## 2022-04-18 NOTE — Procedures (Signed)
Bedside cardiac ultrasound:  Showing large pericardial effusion with no tamponade physiology.

## 2022-04-18 NOTE — ED Notes (Signed)
[facility] called for transport. 

## 2022-04-18 NOTE — ED Notes (Signed)
Pt was able to ambulate to restroom with some assistant.

## 2022-04-18 NOTE — H&P (Signed)
NAME:  Christopher Lara, MRN:  [id number], DOB:  Jan 16, 1976, LOS: 0 ADMISSION DATE:  04/18/2022, CONSULTATION DATE:  04/18/22 REFERRING MD:  Tyrone Nine CHIEF COMPLAINT:  Confusion, weakness   History of Present Illness:  Christopher Lara is a 47 y.o. male who has no significant PMH. He presented to [facility] ED 2/20 with confusion and weakness. He was recently diagnosed with COVID on 04/12/22 and completed a course of Paxlovid. Per his wife, he started feeling bad about a day or so after starting Paxlovid but PCP told him to continue. He had also has increased water intake to ensure adequate hydration.  On 2/20, he was somewhat disoriented and confused. Wife became concerned so called EMS. Initially he complained to them of arm and leg "tightness" but didn't seem altered; therefore, he was not transported to ED. Confusion then lingered so EMS called again and he was taken to [facility] ED.  In ED, he was found to have Na 105. He also had POCUS performed which showed incidental finding large pericardial effusion without tamponade. Of note, he had COVID in 2021 and also had significant hyponatremia at the time (108).  He was started on 3% NS and was transferred to [facility] for further evaluation by cardiology and continued ICU monitoring. Upon arrival to [facility], his Na is up to 113 (3.5 hours later). 3% has been stopped. Bedside POCUS performed and confirmed large effusion. Official echo ordered and cardiology consulted.  Pertinent  Medical History:  has Hyponatremia; Physical debility; and Pneumonia due to COVID-19 virus on their problem list.  Significant Hospital Events: Including procedures, antibiotic start and stop dates in addition to other pertinent events   2/21 admit.  Interim History / Subjective:  Awake, able to answer questions appropriately.  Objective:  Blood pressure (!) 149/102, pulse 66, temperature (!) 97.5 F (36.4 C), temperature source Axillary, resp. rate 17, height 5' 6"$  (1.676 m), weight 79.3 kg, SpO2  100 %.        Intake/Output Summary (Last 24 hours) at 04/18/2022 1121 Last data filed at 04/18/2022 1050 Gross per 24 hour  Intake 182.99 ml  Output 600 ml  Net -417.01 ml   Filed Weights   04/18/22 1045  Weight: 79.3 kg    Examination: General: Adult male, resting in bed, in NAD. Neuro: Awake, alert, MAE's. HEENT: [place]/AT. Sclerae anicteric. EOMI. Cardiovascular: RRR, no M/R/G.  Lungs: Respirations even and unlabored.  CTA bilaterally, No W/R/R. Abdomen: BS x 4, soft, NT/ND.  Musculoskeletal: No gross deformities, no edema.  Skin: Intact, warm, no rashes.  Labs/imaging personally reviewed:  Echo 2/21 >   Assessment & Plan:   Hyponatremia - rapidly overcorrected from 105 to 113 in 3.5 hours. Interestingly, he had similar presentation in 2021 when also diagnosed with COVID.  - Stop 3% NS now. - F/u Na level now then q4hrs.  Large pericardial effusion - no signs of tamponade on POCUS. - Get formal echo. - Cardiology consulted, appreciate the assistance.   Rhabdomyolysis - unclear etiology. No hx of trauma or prolonged downtime etc, but wife state he has been sedentary while on isolation for COVID. - Hold off on fluids until Na results. Either do NS or D5 1/2 NS. - Daily CK.   Best practice (evaluated daily):  Diet/type: Regular consistency (see orders) DVT prophylaxis: prophylactic heparin  GI prophylaxis: N/A Lines: N/A Foley:  N/A Code Status:  full code Last date of multidisciplinary goals of care discussion: None yet.  Labs   CBC: Recent  Labs  Lab 04/18/22 0430  WBC 6.7  NEUTROABS 4.2  HGB 13.8  HCT 36.6*  MCV 83.6  PLT 99991111    Basic Metabolic Panel: Recent Labs  Lab 04/18/22 0430 04/18/22 0554 04/18/22 0754  NA 105* 108* 113*  K 3.7  --   --   CL 76*  --   --   CO2 18*  --   --   GLUCOSE 105*  --   --   BUN 6  --   --   CREATININE 0.84  --   --   CALCIUM 8.9  --   --    GFR: Estimated Creatinine Clearance: 106.5 mL/min (by C-G formula  based on SCr of 0.84 mg/dL). Recent Labs  Lab 04/18/22 0430  WBC 6.7    Liver Function Tests: Recent Labs  Lab 04/18/22 0430  AST 217*  ALT 45*  ALKPHOS 42  BILITOT 1.0  PROT 8.1  ALBUMIN 4.6   No results for input(s): "LIPASE", "AMYLASE" in the last 168 hours. No results for input(s): "AMMONIA" in the last 168 hours.  ABG No results found for: "PHART", "PCO2ART", "PO2ART", "HCO3", "TCO2", "ACIDBASEDEF", "O2SAT"   Coagulation Profile: No results for input(s): "INR", "PROTIME" in the last 168 hours.  Cardiac Enzymes: Recent Labs  Lab 04/18/22 0430  CKTOTAL 15,085*    HbA1C: No results found for: "HGBA1C"  CBG: No results for input(s): "GLUCAP" in the last 168 hours.  Review of Systems:   All negative; except for those that are bolded, which indicate positives.  Constitutional: weight loss, weight gain, night sweats, fevers, chills, fatigue, weakness.  HEENT: headaches, sore throat, sneezing, nasal congestion, post nasal drip, difficulty swallowing, tooth/dental problems, visual complaints, visual changes, ear aches. Neuro: difficulty with speech, weakness, numbness, ataxia. CV:  chest pain, orthopnea, PND, swelling in lower extremities, dizziness, palpitations, syncope.  Resp: cough, hemoptysis, dyspnea, wheezing. GI: heartburn, indigestion, abdominal pain, nausea, vomiting, diarrhea, constipation, change in bowel habits, loss of appetite, hematemesis, melena, hematochezia.  GU: dysuria, change in color of urine, urgency or frequency, flank pain, hematuria. MSK: joint pain or swelling, decreased range of motion. Psych: change in mood or affect, depression, anxiety, suicidal ideations, homicidal ideations. Skin: rash, itching, bruising.   Past Medical History:  He,  has a past medical history of Kidney stone, Kidney stone, Kidney stones, and Kidney stones.   Surgical History:  History reviewed. No pertinent surgical history.   Social History:   reports that  he has never smoked. He has never used smokeless tobacco. He reports that he does not drink alcohol and does not use drugs.   Family History:  His family history includes Cancer in his brother; Heart failure in his father.   Allergies No Known Allergies   Home Medications  Prior to Admission medications   Medication Sig Start Date End Date Taking? Authorizing Provider  cyclobenzaprine (FLEXERIL) 10 MG tablet Take 1 tablet (10 mg total) by mouth 3 (three) times daily as needed for muscle spasms. Patient not taking: Reported on 04/03/2021 04/21/19   Nicolette Bang, MD  tamsulosin (FLOMAX) 0.4 MG CAPS capsule TAKE 1 CAPSULE BY MOUTH EVERY DAY Patient not taking: Reported on 04/03/2021 07/09/19   Cleon Gustin, MD  omeprazole (PRILOSEC) 20 MG capsule Take 1 capsule (20 mg total) by mouth daily. 30 minutes prior to meal. Patient not taking: Reported on 10/01/2014 09/21/14 11/06/14  Dahlia Bailiff, PA-C     Critical care time: 30 min.   Montey Hora,  PA - C [place] [facility] For pager details, please see AMION or use Epic chat  After 1900, please call [place] for cross coverage needs 04/18/2022, 11:21 AM

## 2022-04-19 ENCOUNTER — Encounter (HOSPITAL_COMMUNITY)
Admission: EM | Disposition: A | Discharge status: Home / Self Care | Payer: Self-pay | Source: Home / Self Care | Attending: Internal Medicine

## 2022-04-19 ENCOUNTER — Inpatient Hospital Stay (HOSPITAL_COMMUNITY): Payer: Federal, State, Local not specified - PPO

## 2022-04-19 ENCOUNTER — Other Ambulatory Visit (HOSPITAL_COMMUNITY): Payer: Federal, State, Local not specified - PPO

## 2022-04-19 DIAGNOSIS — E871 Hypo-osmolality and hyponatremia: Secondary | ICD-10-CM | POA: Diagnosis not present

## 2022-04-19 DIAGNOSIS — I3139 Other pericardial effusion (noninflammatory): Secondary | ICD-10-CM

## 2022-04-19 DIAGNOSIS — M6282 Rhabdomyolysis: Secondary | ICD-10-CM | POA: Diagnosis not present

## 2022-04-19 DIAGNOSIS — U099 Post covid-19 condition, unspecified: Secondary | ICD-10-CM | POA: Diagnosis not present

## 2022-04-19 HISTORY — PX: PERICARDIOCENTESIS: CATH118255

## 2022-04-19 LAB — CBC
HCT: 33.2 % — ABNORMAL LOW (ref 39.0–52.0)
Hemoglobin: 12.4 g/dL — ABNORMAL LOW (ref 13.0–17.0)
MCH: 32.1 pg (ref 26.0–34.0)
MCHC: 37.3 g/dL — ABNORMAL HIGH (ref 30.0–36.0)
MCV: 86 fL (ref 80.0–100.0)
Platelets: 235 10*3/uL (ref 150–400)
RBC: 3.86 MIL/uL — ABNORMAL LOW (ref 4.22–5.81)
RDW: 11.7 % (ref 11.5–15.5)
WBC: 5.3 10*3/uL (ref 4.0–10.5)
nRBC: 0.4 % — ABNORMAL HIGH (ref 0.0–0.2)

## 2022-04-19 LAB — BASIC METABOLIC PANEL
Anion gap: 8 (ref 5–15)
BUN: 5 mg/dL — ABNORMAL LOW (ref 6–20)
CO2: 19 mmol/L — ABNORMAL LOW (ref 22–32)
Calcium: 8.9 mg/dL (ref 8.9–10.3)
Chloride: 93 mmol/L — ABNORMAL LOW (ref 98–111)
Creatinine, Ser: 1.01 mg/dL (ref 0.61–1.24)
GFR, Estimated: >60 mL/min (ref >60)
Glucose, Bld: 78 mg/dL (ref 70–99)
Potassium: 3.8 mmol/L (ref 3.5–5.1)
Sodium: 120 mmol/L — ABNORMAL LOW (ref 135–145)

## 2022-04-19 LAB — ECHOCARDIOGRAM LIMITED
Height: 66 in
Weight: 2726.65 oz

## 2022-04-19 LAB — GRAM STAIN

## 2022-04-19 LAB — BODY FLUID CELL COUNT WITH DIFFERENTIAL
Eos, Fluid: 0 %
Lymphs, Fluid: 79 %
Monocyte-Macrophage-Serous Fluid: 16 % — ABNORMAL LOW (ref 50–90)
Neutrophil Count, Fluid: 5 % (ref 0–25)
Total Nucleated Cell Count, Fluid: 2410 cu mm — ABNORMAL HIGH (ref 0–1000)

## 2022-04-19 LAB — MAGNESIUM: Magnesium: 2.6 mg/dL — ABNORMAL HIGH (ref 1.7–2.4)

## 2022-04-19 LAB — SODIUM
Sodium: 119 mmol/L — CL (ref 135–145)
Sodium: 123 mmol/L — ABNORMAL LOW (ref 135–145)
Sodium: 129 mmol/L — ABNORMAL LOW (ref 135–145)
Sodium: 128 mmol/L — ABNORMAL LOW (ref 135–145)
Sodium: 127 mmol/L — ABNORMAL LOW (ref 135–145)

## 2022-04-19 LAB — CK: Total CK: 7936 U/L — ABNORMAL HIGH (ref 49–397)

## 2022-04-19 LAB — PHOSPHORUS
Phosphorus: 1.5 mg/dL — ABNORMAL LOW (ref 2.5–4.6)
Phosphorus: 1.6 mg/dL — ABNORMAL LOW (ref 2.5–4.6)

## 2022-04-19 SURGERY — PERICARDIOCENTESIS
Anesthesia: LOCAL

## 2022-04-19 MED ORDER — HYDRALAZINE HCL 20 MG/ML IJ SOLN: 10.0000 mg | INTRAMUSCULAR | Status: AC | PRN

## 2022-04-19 MED ORDER — DEXTROSE 5 % IV BOLUS
500.0000 mL | Freq: Once | INTRAVENOUS | Status: AC
  Administered 2022-04-19: 500 mL via INTRAVENOUS

## 2022-04-19 MED ORDER — LABETALOL HCL 5 MG/ML IV SOLN: 10.0000 mg | INTRAVENOUS | Status: AC | PRN

## 2022-04-19 MED ORDER — FENTANYL CITRATE (PF) 100 MCG/2ML IJ SOLN
INTRAMUSCULAR | Status: DC | PRN
  Administered 2022-04-19: 25 ug via INTRAVENOUS

## 2022-04-19 MED ORDER — FENTANYL CITRATE (PF) 100 MCG/2ML IJ SOLN
INTRAMUSCULAR | Status: AC
  Filled 2022-04-19: qty 2

## 2022-04-19 MED ORDER — SODIUM CHLORIDE 0.9% FLUSH
3.0000 mL | Freq: Two times a day (BID) | INTRAVENOUS | Status: DC
  Administered 2022-04-19 – 2022-04-21 (×5): 3 mL via INTRAVENOUS

## 2022-04-19 MED ORDER — SODIUM CHLORIDE 0.9% FLUSH: 3.0000 mL | INTRAVENOUS | Status: DC | PRN

## 2022-04-19 MED ORDER — MIDAZOLAM HCL 2 MG/2ML IJ SOLN
INTRAMUSCULAR | Status: AC
  Filled 2022-04-19: qty 2

## 2022-04-19 MED ORDER — POTASSIUM PHOSPHATES 15 MMOLE/5ML IV SOLN
30.0000 mmol | Freq: Once | INTRAVENOUS | Status: AC
  Administered 2022-04-19: 30 mmol via INTRAVENOUS
  Filled 2022-04-19: qty 10

## 2022-04-19 MED ORDER — LIDOCAINE HCL (PF) 1 % IJ SOLN
INTRAMUSCULAR | Status: DC | PRN
  Administered 2022-04-19: 2 mL

## 2022-04-19 MED ORDER — SODIUM CHLORIDE 0.9 % IV SOLN: 250.0000 mL | INTRAVENOUS | Status: DC | PRN

## 2022-04-19 MED ORDER — IOHEXOL 350 MG/ML SOLN
INTRAVENOUS | Status: DC | PRN
  Administered 2022-04-19: 3 mL

## 2022-04-19 MED ORDER — SODIUM CHLORIDE 0.45 % IV BOLUS: 1000.0000 mL | Freq: Once | INTRAVENOUS | Status: DC

## 2022-04-19 MED ORDER — POTASSIUM PHOSPHATES 15 MMOLE/5ML IV SOLN
45.0000 mmol | Freq: Once | INTRAVENOUS | Status: AC
  Administered 2022-04-19: 45 mmol via INTRAVENOUS
  Filled 2022-04-19: qty 15

## 2022-04-19 MED ORDER — HEPARIN (PORCINE) IN NACL 1000-0.9 UT/500ML-% IV SOLN
INTRAVENOUS | Status: DC | PRN
  Administered 2022-04-19: 500 mL

## 2022-04-19 MED ORDER — HEPARIN SODIUM (PORCINE) 5000 UNIT/ML IJ SOLN
5000.0000 [IU] | Freq: Three times a day (TID) | INTRAMUSCULAR | Status: DC
  Administered 2022-04-20 – 2022-04-21 (×4): 5000 [IU] via SUBCUTANEOUS
  Filled 2022-04-19 (×4): qty 1

## 2022-04-19 MED ORDER — MIDAZOLAM HCL 2 MG/2ML IJ SOLN
INTRAMUSCULAR | Status: DC | PRN
  Administered 2022-04-19: 1 mg via INTRAVENOUS

## 2022-04-19 MED ORDER — LIDOCAINE HCL (PF) 1 % IJ SOLN
INTRAMUSCULAR | Status: AC
  Filled 2022-04-19: qty 30

## 2022-04-19 SURGICAL SUPPLY — 7 items
CATH ANGIO 5F PIGTAIL 65CM (CATHETERS) IMPLANT
KIT MICROPUNCTURE NIT STIFF (SHEATH) IMPLANT
PACK CARDIAC CATHETERIZATION (CUSTOM PROCEDURE TRAY) IMPLANT
PROTECTION STATION PRESSURIZED (MISCELLANEOUS) ×1
STATION PROTECTION PRESSURIZED (MISCELLANEOUS) IMPLANT
WIRE EMERALD 3MM-J .035X150CM (WIRE) IMPLANT
WIRE MICRO SET 5FR 12 (WIRE) IMPLANT

## 2022-04-19 NOTE — Progress Notes (Signed)
Rounding Note    Patient Name: Christopher Lara Date of Encounter: 04/19/2022  [place] Cardiologist: None New- Dr Martinique   Subjective   Feels well. Denies any chest pain or dyspnea.   Inpatient Medications    Scheduled Meds:  Chlorhexidine Gluconate Cloth  6 each Topical Daily   heparin  5,000 Units Subcutaneous Q8H   Continuous Infusions:  sodium chloride 125 mL/hr at 04/19/22 0100   potassium PHOSPHATE IVPB (in mmol) 45 mmol (04/19/22 0631)   PRN Meds: docusate sodium, mouth rinse, polyethylene glycol   Vital Signs    Vitals:   04/19/22 0400 04/19/22 0500 04/19/22 0600 04/19/22 0700  BP: 94/73 102/70 119/84 92/67  Pulse: 65 65 71 64  Resp: 16 14 10 15  $ Temp:      TempSrc:      SpO2: 97% 96% 97% 95%  Weight:  77.3 kg    Height:        Intake/Output Summary (Last 24 hours) at 04/19/2022 0728 Last data filed at 04/19/2022 0100 Gross per 24 hour  Intake 3021.23 ml  Output 4505 ml  Net -1483.77 ml      04/19/2022    5:00 AM 04/18/2022   10:45 AM 04/03/2021    3:03 PM  Last 3 Weights  Weight (lbs) 170 lb 6.7 oz 174 lb 13.2 oz 177 lb  Weight (kg) 77.3 kg 79.3 kg 80.287 kg      Telemetry    NSR - Personally Reviewed  ECG    NSR, low voltage - Personally Reviewed  Physical Exam   GEN: No acute distress.   Neck: No JVD Cardiac: RRR, no murmurs, rubs, or gallops.  Respiratory: Clear to auscultation bilaterally. GI: Soft, nontender, non-distended  MS: No edema; No deformity. Neuro:  Nonfocal  Psych: Normal affect   Labs    High Sensitivity Troponin:   Recent Labs  Lab 04/18/22 0430  TROPONINIHS 6     Chemistry Recent Labs  Lab 04/18/22 0430 04/18/22 0554 04/18/22 1855 04/18/22 2148 04/19/22 0257  NA 105*   < > 115* 119* 120*  K 3.7  --   --   --  3.8  CL 76*  --   --   --  93*  CO2 18*  --   --   --  19*  GLUCOSE 105*  --   --   --  78  BUN 6  --   --   --  5*  CREATININE 0.84  --   --   --  1.01  CALCIUM 8.9  --   --    --  8.9  MG  --   --   --   --  2.6*  PROT 8.1  --   --   --   --   ALBUMIN 4.6  --   --   --   --   AST 217*  --   --   --   --   ALT 45*  --   --   --   --   ALKPHOS 42  --   --   --   --   BILITOT 1.0  --   --   --   --   GFRNONAA >60  --   --   --  >60  ANIONGAP 11  --   --   --  8   < > = values in this interval not displayed.    Lipids No  results for input(s): "CHOL", "TRIG", "HDL", "LABVLDL", "LDLCALC", "CHOLHDL" in the last 168 hours.  Hematology Recent Labs  Lab 04/18/22 0430 04/19/22 0257  WBC 6.7 5.3  RBC 4.38 3.86*  HGB 13.8 12.4*  HCT 36.6* 33.2*  MCV 83.6 86.0  MCH 31.5 32.1  MCHC 37.7* 37.3*  RDW 11.7 11.7  PLT 198 235   Thyroid No results for input(s): "TSH", "FREET4" in the last 168 hours.  BNP Recent Labs  Lab 04/18/22 0430  BNP 36.4    DDimer No results for input(s): "DDIMER" in the last 168 hours.   Radiology    ECHOCARDIOGRAM COMPLETE  Result Date: 04/18/2022    ECHOCARDIOGRAM REPORT   Patient Name:   Christopher Lara Date of Exam: 04/18/2022 Medical Rec #:  [id number]      Height:       66.0 in Accession #:    [id number]     Weight:       174.8 lb Date of Birth:  1975/09/28      BSA:          1.889 m Patient Age:    47 years       BP:           149/102 mmHg Patient Gender: M              HR:           67 bpm. Exam Location:  Inpatient Procedure: 2D Echo, Cardiac Doppler and Color Doppler Indications:    Pericardial effusion  History:        Patient has no prior history of Echocardiogram examinations.  Sonographer:    Meagan Baucom RDCS, FE, PE Referring Phys: [id number] RAHUL P DESAI IMPRESSIONS  1. Left ventricular ejection fraction, by estimation, is 70 to 75%. The left ventricle has hyperdynamic function. The left ventricle has no regional wall motion abnormalities. There is mild concentric left ventricular hypertrophy. Left ventricular diastolic parameters are consistent with Grade II diastolic dysfunction (pseudonormalization). Elevated left ventricular  end-diastolic pressure.  2. Right ventricular systolic function is normal. The right ventricular size is normal.  3. Very large pericardial effusion measuring up to 3.85 cm. There is mild RV diastolic collapse and respiratory flow variation across the tricuspid valve but not the mitral valve. The IVC is normal in size and collapses normally. Recommend clinical correlation. Current findings are not consistent with tamponade, but patient is at high risk for developing tamponade. Large pericardial effusion. The pericardial effusion is circumferential.  4. The mitral valve is normal in structure. No evidence of mitral valve regurgitation. No evidence of mitral stenosis.  5. The aortic valve is normal in structure. Aortic valve regurgitation is not visualized. No aortic stenosis is present.  6. The inferior vena cava is normal in size with greater than 50% respiratory variability, suggesting right atrial pressure of 3 mmHg. FINDINGS  Left Ventricle: Left ventricular ejection fraction, by estimation, is 70 to 75%. The left ventricle has hyperdynamic function. The left ventricle has no regional wall motion abnormalities. The left ventricular internal cavity size was normal in size. There is mild concentric left ventricular hypertrophy. Left ventricular diastolic parameters are consistent with Grade II diastolic dysfunction (pseudonormalization). Elevated left ventricular end-diastolic pressure. Right Ventricle: The right ventricular size is normal. No increase in right ventricular wall thickness. Right ventricular systolic function is normal. Left Atrium: Left atrial size was normal in size. Right Atrium: Right atrial size was normal in size. Pericardium: Very large pericardial effusion measuring up  to 3.85 cm. There is mild RV diastolic collapse and respiratory flow variation across the tricuspid valve but not the mitral valve. The IVC is normal in size and collapses normally. Recommend clinical correlation. Current  findings are not consistent with tamponade, but patient is at high risk for developing tamponade. A large pericardial effusion is present. The pericardial effusion is circumferential. There is diastolic collapse of the right  atrial wall and excessive respiratory variation in the tricuspid valve spectral Doppler velocities. Mitral Valve: The mitral valve is normal in structure. No evidence of mitral valve regurgitation. No evidence of mitral valve stenosis. Tricuspid Valve: The tricuspid valve is normal in structure. Tricuspid valve regurgitation is not demonstrated. No evidence of tricuspid stenosis. Aortic Valve: The aortic valve is normal in structure. Aortic valve regurgitation is not visualized. No aortic stenosis is present. Pulmonic Valve: The pulmonic valve was normal in structure. Pulmonic valve regurgitation is not visualized. No evidence of pulmonic stenosis. Aorta: The aortic root is normal in size and structure. Venous: The inferior vena cava is normal in size with greater than 50% respiratory variability, suggesting right atrial pressure of 3 mmHg. IAS/Shunts: No atrial level shunt detected by color flow Doppler.  LEFT VENTRICLE PLAX 2D LVIDd:         3.60 cm   Diastology LVIDs:         2.00 cm   LV e' medial:   3.42 cm/s LV PW:         1.20 cm   LV E/e' medial: 20.7 LV IVS:        1.10 cm LVOT diam:     2.20 cm LV SV:         75 LV SV Index:   40 LVOT Area:     3.80 cm  RIGHT VENTRICLE RV S prime:     8.24 cm/s LEFT ATRIUM           Index        RIGHT ATRIUM           Index LA Vol (A2C): 25.3 ml 13.40 ml/m  RA Area:     11.00 cm LA Vol (A4C): 34.6 ml 18.32 ml/m  RA Volume:   22.70 ml  12.02 ml/m  AORTIC VALVE LVOT Vmax:   92.10 cm/s LVOT Vmean:  60.000 cm/s LVOT VTI:    0.197 m  AORTA Ao Root diam: 3.20 cm Ao Asc diam:  3.00 cm MITRAL VALVE MV Area (PHT): 2.64 cm    SHUNTS MV Decel Time: 287 msec    Systemic VTI:  0.20 m MV E velocity: 70.70 cm/s  Systemic Diam: 2.20 cm MV A velocity: 53.10 cm/s  MV E/A ratio:  1.33 Skeet Latch MD Electronically signed by Skeet Latch MD Signature Date/Time: 04/18/2022/11:48:43 AM    Final    [place] EKG SITE RITE  Result Date: 04/18/2022 If Site Rite image not attached, placement could not be confirmed due to current cardiac rhythm.  DG Chest 2 View  Result Date: 04/18/2022 CLINICAL DATA:  47 year old male with recent COVID-19. weakness, [place]. EXAM: CHEST - 2 VIEW COMPARISON:  Portable chest 03/15/2019. CT Abdomen and Pelvis 01/13/2020 FINDINGS: Semi upright AP and lateral views at 0456 hours. New cardiomegaly since 2021. Possible pericardial effusion. Other mediastinal contours are within normal limits. Visualized tracheal air column is within normal limits. Stable low lung volumes. Allowing for portable technique the lungs are clear. No pulmonary edema or pleural effusion. Chronic T10 anterior wedge compression fracture was present  on the previous CT Abdomen and Pelvis. No acute osseous abnormality identified. Negative visible bowel gas. IMPRESSION: 1. Cardiomegaly is new since 2021, consider pericardial effusion. 2. No other acute cardiopulmonary abnormality. 3. Chronic T10 compression fracture. Electronically Signed   By: Genevie Ann M.D.   On: 04/18/2022 05:35    Cardiac Studies   Echocardiogram 04/18/2022: Impressions: 1. Left ventricular ejection fraction, by estimation, is 70 to 75%. The  left ventricle has hyperdynamic function. The left ventricle has no  regional wall motion abnormalities. There is mild concentric left  ventricular hypertrophy. Left ventricular  diastolic parameters are consistent with Grade II diastolic dysfunction  (pseudonormalization). Elevated left ventricular end-diastolic pressure.   2. Right ventricular systolic function is normal. The right ventricular  size is normal.   3. Very large pericardial effusion measuring up to 3.85 cm. There is mild  RV diastolic collapse and respiratory flow variation across the tricuspid   valve but not the mitral valve. The IVC is normal in size and collapses  normally. Recommend clinical  correlation. Current findings are not consistent with tamponade, but  patient is at high risk for developing tamponade. Large pericardial  effusion. The pericardial effusion is circumferential.   4. The mitral valve is normal in structure. No evidence of mitral valve  regurgitation. No evidence of mitral stenosis.   5. The aortic valve is normal in structure. Aortic valve regurgitation is  not visualized. No aortic stenosis is present.   6. The inferior vena cava is normal in size with greater than 50%  respiratory variability, suggesting right atrial pressure of 3 mmHg.   Patient Profile     47 y.o. male with a history of severe hyponatremia in setting of COVID pneumonia in 02/2019 but no other significant past medical history who was admitted on 04/18/2022 for severe hyponatremia after presenting with altered mental status. He was also incidentally found to have a large pericardial effusion for which Cardiology has been asked to see for at the request of Dr. Tacy Learn.   Assessment & Plan    Large Pericardial Effusion Patient was admitted with severe hyponatremia after presenting with altered mental status. Echo showed normal LV function with large pericardial effusion measuring up to 3.85 cm with mild RV diastolic collapse and respiratory flow variation across the tricuspid valve but not the mitral valve. IVC is normal and size and collapses normally. Current findings not consistent with tamponade but he is at high risk for developing tamponade.  - Hemodynamically stable. - Pericardial effusion possibly due to recent COVID infection.  - plan pericardiocentesis today   Severe Hyponatremia Sodium 105 on admission. 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  Being treated with NaCl.  - Management per PCCM. - sodium improved to 120 today   Rhabdomyolysis  CK elevated at 15,086. Being treated with IV  fluids. - Management per PCCM.     For questions or updates, please contact [place] Please consult [email or web address] for contact info under        Signed, Jaton Eilers [place], MD  04/19/2022, 7:28 AM

## 2022-04-19 NOTE — Interval H&P Note (Signed)
History and Physical Interval Note:  04/19/2022 11:46 AM  Christopher Lara  has presented today for surgery, with the diagnosis of chest pain.  The various methods of treatment have been discussed with the patient and family. After consideration of risks, benefits and other options for treatment, the patient has consented to  Procedure(s): PERICARDIOCENTESIS (N/A) as a surgical intervention.  The patient's history has been reviewed, patient examined, no change in status, stable for surgery.  I have reviewed the patient's chart and labs.  Questions were answered to the patient's satisfaction.      [name]

## 2022-04-19 NOTE — H&P (View-Only) (Signed)
Rounding Note    Patient Name: Christopher Lara Date of Encounter: 04/19/2022  [place] Cardiologist: None New- Dr Martinique   Subjective   Feels well. Denies any chest pain or dyspnea.   Inpatient Medications    Scheduled Meds:  Chlorhexidine Gluconate Cloth  6 each Topical Daily   heparin  5,000 Units Subcutaneous Q8H   Continuous Infusions:  sodium chloride 125 mL/hr at 04/19/22 0100   potassium PHOSPHATE IVPB (in mmol) 45 mmol (04/19/22 0631)   PRN Meds: docusate sodium, mouth rinse, polyethylene glycol   Vital Signs    Vitals:   04/19/22 0400 04/19/22 0500 04/19/22 0600 04/19/22 0700  BP: 94/73 102/70 119/84 92/67  Pulse: 65 65 71 64  Resp: 16 14 10 15  $ Temp:      TempSrc:      SpO2: 97% 96% 97% 95%  Weight:  77.3 kg    Height:        Intake/Output Summary (Last 24 hours) at 04/19/2022 0728 Last data filed at 04/19/2022 0100 Gross per 24 hour  Intake 3021.23 ml  Output 4505 ml  Net -1483.77 ml      04/19/2022    5:00 AM 04/18/2022   10:45 AM 04/03/2021    3:03 PM  Last 3 Weights  Weight (lbs) 170 lb 6.7 oz 174 lb 13.2 oz 177 lb  Weight (kg) 77.3 kg 79.3 kg 80.287 kg      Telemetry    NSR - Personally Reviewed  ECG    NSR, low voltage - Personally Reviewed  Physical Exam   GEN: No acute distress.   Neck: No JVD Cardiac: RRR, no murmurs, rubs, or gallops.  Respiratory: Clear to auscultation bilaterally. GI: Soft, nontender, non-distended  MS: No edema; No deformity. Neuro:  Nonfocal  Psych: Normal affect   Labs    High Sensitivity Troponin:   Recent Labs  Lab 04/18/22 0430  TROPONINIHS 6     Chemistry Recent Labs  Lab 04/18/22 0430 04/18/22 0554 04/18/22 1855 04/18/22 2148 04/19/22 0257  NA 105*   < > 115* 119* 120*  K 3.7  --   --   --  3.8  CL 76*  --   --   --  93*  CO2 18*  --   --   --  19*  GLUCOSE 105*  --   --   --  78  BUN 6  --   --   --  5*  CREATININE 0.84  --   --   --  1.01  CALCIUM 8.9  --   --    --  8.9  MG  --   --   --   --  2.6*  PROT 8.1  --   --   --   --   ALBUMIN 4.6  --   --   --   --   AST 217*  --   --   --   --   ALT 45*  --   --   --   --   ALKPHOS 42  --   --   --   --   BILITOT 1.0  --   --   --   --   GFRNONAA >60  --   --   --  >60  ANIONGAP 11  --   --   --  8   < > = values in this interval not displayed.    Lipids No  results for input(s): "CHOL", "TRIG", "HDL", "LABVLDL", "LDLCALC", "CHOLHDL" in the last 168 hours.  Hematology Recent Labs  Lab 04/18/22 0430 04/19/22 0257  WBC 6.7 5.3  RBC 4.38 3.86*  HGB 13.8 12.4*  HCT 36.6* 33.2*  MCV 83.6 86.0  MCH 31.5 32.1  MCHC 37.7* 37.3*  RDW 11.7 11.7  PLT 198 235   Thyroid No results for input(s): "TSH", "FREET4" in the last 168 hours.  BNP Recent Labs  Lab 04/18/22 0430  BNP 36.4    DDimer No results for input(s): "DDIMER" in the last 168 hours.   Radiology    ECHOCARDIOGRAM COMPLETE  Result Date: 04/18/2022    ECHOCARDIOGRAM REPORT   Patient Name:   Christopher Lara Date of Exam: 04/18/2022 Medical Rec #:  [id number]      Height:       66.0 in Accession #:    [id number]     Weight:       174.8 lb Date of Birth:  1975/11/26      BSA:          1.889 m Patient Age:    47 years       BP:           149/102 mmHg Patient Gender: M              HR:           67 bpm. Exam Location:  Inpatient Procedure: 2D Echo, Cardiac Doppler and Color Doppler Indications:    Pericardial effusion  History:        Patient has no prior history of Echocardiogram examinations.  Sonographer:    Meagan Baucom RDCS, FE, PE Referring Phys: [id number] RAHUL P DESAI IMPRESSIONS  1. Left ventricular ejection fraction, by estimation, is 70 to 75%. The left ventricle has hyperdynamic function. The left ventricle has no regional wall motion abnormalities. There is mild concentric left ventricular hypertrophy. Left ventricular diastolic parameters are consistent with Grade II diastolic dysfunction (pseudonormalization). Elevated left ventricular  end-diastolic pressure.  2. Right ventricular systolic function is normal. The right ventricular size is normal.  3. Very large pericardial effusion measuring up to 3.85 cm. There is mild RV diastolic collapse and respiratory flow variation across the tricuspid valve but not the mitral valve. The IVC is normal in size and collapses normally. Recommend clinical correlation. Current findings are not consistent with tamponade, but patient is at high risk for developing tamponade. Large pericardial effusion. The pericardial effusion is circumferential.  4. The mitral valve is normal in structure. No evidence of mitral valve regurgitation. No evidence of mitral stenosis.  5. The aortic valve is normal in structure. Aortic valve regurgitation is not visualized. No aortic stenosis is present.  6. The inferior vena cava is normal in size with greater than 50% respiratory variability, suggesting right atrial pressure of 3 mmHg. FINDINGS  Left Ventricle: Left ventricular ejection fraction, by estimation, is 70 to 75%. The left ventricle has hyperdynamic function. The left ventricle has no regional wall motion abnormalities. The left ventricular internal cavity size was normal in size. There is mild concentric left ventricular hypertrophy. Left ventricular diastolic parameters are consistent with Grade II diastolic dysfunction (pseudonormalization). Elevated left ventricular end-diastolic pressure. Right Ventricle: The right ventricular size is normal. No increase in right ventricular wall thickness. Right ventricular systolic function is normal. Left Atrium: Left atrial size was normal in size. Right Atrium: Right atrial size was normal in size. Pericardium: Very large pericardial effusion measuring up  to 3.85 cm. There is mild RV diastolic collapse and respiratory flow variation across the tricuspid valve but not the mitral valve. The IVC is normal in size and collapses normally. Recommend clinical correlation. Current  findings are not consistent with tamponade, but patient is at high risk for developing tamponade. A large pericardial effusion is present. The pericardial effusion is circumferential. There is diastolic collapse of the right  atrial wall and excessive respiratory variation in the tricuspid valve spectral Doppler velocities. Mitral Valve: The mitral valve is normal in structure. No evidence of mitral valve regurgitation. No evidence of mitral valve stenosis. Tricuspid Valve: The tricuspid valve is normal in structure. Tricuspid valve regurgitation is not demonstrated. No evidence of tricuspid stenosis. Aortic Valve: The aortic valve is normal in structure. Aortic valve regurgitation is not visualized. No aortic stenosis is present. Pulmonic Valve: The pulmonic valve was normal in structure. Pulmonic valve regurgitation is not visualized. No evidence of pulmonic stenosis. Aorta: The aortic root is normal in size and structure. Venous: The inferior vena cava is normal in size with greater than 50% respiratory variability, suggesting right atrial pressure of 3 mmHg. IAS/Shunts: No atrial level shunt detected by color flow Doppler.  LEFT VENTRICLE PLAX 2D LVIDd:         3.60 cm   Diastology LVIDs:         2.00 cm   LV e' medial:   3.42 cm/s LV PW:         1.20 cm   LV E/e' medial: 20.7 LV IVS:        1.10 cm LVOT diam:     2.20 cm LV SV:         75 LV SV Index:   40 LVOT Area:     3.80 cm  RIGHT VENTRICLE RV S prime:     8.24 cm/s LEFT ATRIUM           Index        RIGHT ATRIUM           Index LA Vol (A2C): 25.3 ml 13.40 ml/m  RA Area:     11.00 cm LA Vol (A4C): 34.6 ml 18.32 ml/m  RA Volume:   22.70 ml  12.02 ml/m  AORTIC VALVE LVOT Vmax:   92.10 cm/s LVOT Vmean:  60.000 cm/s LVOT VTI:    0.197 m  AORTA Ao Root diam: 3.20 cm Ao Asc diam:  3.00 cm MITRAL VALVE MV Area (PHT): 2.64 cm    SHUNTS MV Decel Time: 287 msec    Systemic VTI:  0.20 m MV E velocity: 70.70 cm/s  Systemic Diam: 2.20 cm MV A velocity: 53.10 cm/s  MV E/A ratio:  1.33 Skeet Latch MD Electronically signed by Skeet Latch MD Signature Date/Time: 04/18/2022/11:48:43 AM    Final    [place] EKG SITE RITE  Result Date: 04/18/2022 If Site Rite image not attached, placement could not be confirmed due to current cardiac rhythm.  DG Chest 2 View  Result Date: 04/18/2022 CLINICAL DATA:  47 year old male with recent COVID-19. weakness, [place]. EXAM: CHEST - 2 VIEW COMPARISON:  Portable chest 03/15/2019. CT Abdomen and Pelvis 01/13/2020 FINDINGS: Semi upright AP and lateral views at 0456 hours. New cardiomegaly since 2021. Possible pericardial effusion. Other mediastinal contours are within normal limits. Visualized tracheal air column is within normal limits. Stable low lung volumes. Allowing for portable technique the lungs are clear. No pulmonary edema or pleural effusion. Chronic T10 anterior wedge compression fracture was present  on the previous CT Abdomen and Pelvis. No acute osseous abnormality identified. Negative visible bowel gas. IMPRESSION: 1. Cardiomegaly is new since 2021, consider pericardial effusion. 2. No other acute cardiopulmonary abnormality. 3. Chronic T10 compression fracture. Electronically Signed   By: Genevie Ann M.D.   On: 04/18/2022 05:35    Cardiac Studies   Echocardiogram 04/18/2022: Impressions: 1. Left ventricular ejection fraction, by estimation, is 70 to 75%. The  left ventricle has hyperdynamic function. The left ventricle has no  regional wall motion abnormalities. There is mild concentric left  ventricular hypertrophy. Left ventricular  diastolic parameters are consistent with Grade II diastolic dysfunction  (pseudonormalization). Elevated left ventricular end-diastolic pressure.   2. Right ventricular systolic function is normal. The right ventricular  size is normal.   3. Very large pericardial effusion measuring up to 3.85 cm. There is mild  RV diastolic collapse and respiratory flow variation across the tricuspid   valve but not the mitral valve. The IVC is normal in size and collapses  normally. Recommend clinical  correlation. Current findings are not consistent with tamponade, but  patient is at high risk for developing tamponade. Large pericardial  effusion. The pericardial effusion is circumferential.   4. The mitral valve is normal in structure. No evidence of mitral valve  regurgitation. No evidence of mitral stenosis.   5. The aortic valve is normal in structure. Aortic valve regurgitation is  not visualized. No aortic stenosis is present.   6. The inferior vena cava is normal in size with greater than 50%  respiratory variability, suggesting right atrial pressure of 3 mmHg.   Patient Profile     47 y.o. male with a history of severe hyponatremia in setting of COVID pneumonia in 02/2019 but no other significant past medical history who was admitted on 04/18/2022 for severe hyponatremia after presenting with altered mental status. He was also incidentally found to have a large pericardial effusion for which Cardiology has been asked to see for at the request of Dr. Tacy Learn.   Assessment & Plan    Large Pericardial Effusion Patient was admitted with severe hyponatremia after presenting with altered mental status. Echo showed normal LV function with large pericardial effusion measuring up to 3.85 cm with mild RV diastolic collapse and respiratory flow variation across the tricuspid valve but not the mitral valve. IVC is normal and size and collapses normally. Current findings not consistent with tamponade but he is at high risk for developing tamponade.  - Hemodynamically stable. - Pericardial effusion possibly due to recent COVID infection.  - plan pericardiocentesis today   Severe Hyponatremia Sodium 105 on admission. 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  Being treated with NaCl.  - Management per PCCM. - sodium improved to 120 today   Rhabdomyolysis  CK elevated at 15,086. Being treated with IV  fluids. - Management per PCCM.     For questions or updates, please contact [place] Please consult [email or web address] for contact info under        Signed, Lillias Difrancesco Martin[place], MD  04/19/2022, 7:28 AM

## 2022-04-19 NOTE — Progress Notes (Signed)
PCCM Brief Note  Na 123 this AM and up to 129 this PM.  Hold anymore NS or 1/2 NS. Will give 500cc D5W back over 5 hours then let pt self correct. Continue Na checks as scheduled, will ask [facility] to follow overnight.  Underwent pericardiocentesis earlier with 1000cc straw colored fluid removed and labs sent. No drain placed. Plan for repeat echo tomorrow 2/23.   Stable for transfer out of ICU. Will ask [facility] to assume care in AM 2/23 with PCCM off. Cardiology still following for repeat echo and decisions on any further interventions for pericardial effusion.   Montey Hora, [facility] For pager details, please see AMION or use Epic chat  After 1900, please call [facility] for cross coverage needs 04/19/2022, 4:47 PM

## 2022-04-19 NOTE — Progress Notes (Signed)
[place] Progress Note Patient Name: Christopher Lara DOB: 11-10-1975 MRN: [id number]   Date of Service  04/19/2022  HPI/Events of Note  Phos 1.6 @ 2007 CK 7936 in AM Cr. 1.01  eICU Interventions  Kphos x1     Intervention Category Minor Interventions: Electrolytes abnormality - evaluation and management  Tynisha Ogan 04/19/2022, 9:28 PM

## 2022-04-19 NOTE — Progress Notes (Addendum)
NAME:  Christopher Lara, MRN:  [id number], DOB:  1975-09-10, LOS: 1 ADMISSION DATE:  04/18/2022, CONSULTATION DATE:  04/18/22 REFERRING MD:  Tyrone Nine CHIEF COMPLAINT:  Confusion, weakness   History of Present Illness:  Christopher Lara is a 47 y.o. male who has no significant PMH. He [facility] ED 2/20 with confusion and weakness. He was recently diagnosed with COVID on 04/12/22 and completed a course of Paxlovid. Per his wife, he started feeling bad about a day or so after starting Paxlovid but PCP told him to continue. He had also has increased water intake to ensure adequate hydration.  On 2/20, he was somewhat disoriented and confused. Wife became concerned so called EMS. Initially he complained to them of arm and leg "tightness" but didn't seem altered; therefore, he was not transported to ED. Confusion then lingered so EMS called again and he was taken to [facility] ED.  In ED, he was found to have Na 105. He also had POCUS performed which showed incidental finding large pericardial effusion without tamponade. Of note, he had COVID in 2021 and also had significant hyponatremia at the time (108).  He was started on 3% NS and was transferred to [facility] for further evaluation by cardiology and continued ICU monitoring. Upon arrival to [facility], his Na is up to 113 (3.5 hours later). 3% has been stopped. Bedside POCUS performed and confirmed large effusion. Official echo ordered and cardiology consulted.  Pertinent  Medical History:  has Hyponatremia; Physical debility; and Pneumonia due to COVID-19 virus on their problem list.  Significant Hospital Events: Including procedures, antibiotic start and stop dates in addition to other pertinent events   2/21 admit. Echo with large effusion, no tamponade but at risk. 2/22 pericardiocentesis planned for today  Interim History / Subjective:  NAEON. Feels ok A bit more alert. Na 120 at 0300.  Objective:  Blood pressure 92/67, pulse 64, temperature 98.1 F (36.7 C),  temperature source Oral, resp. rate 15, height 5' 6"$  (1.676 m), weight 77.3 kg, SpO2 95 %.        Intake/Output Summary (Last 24 hours) at 04/19/2022 0750 Last data filed at 04/19/2022 0100 Gross per 24 hour  Intake 3021.23 ml  Output 4505 ml  Net -1483.77 ml    Filed Weights   04/18/22 1045 04/19/22 0500  Weight: 79.3 kg 77.3 kg    Examination:  General: Adult male, resting in bed, in NAD. Neuro: Awake, alert, MAE's. HEENT: [place]/AT. Sclerae anicteric. EOMI. Cardiovascular: RRR, no M/R/G.  Lungs: Respirations even and unlabored.  CTA bilaterally, No W/R/R. Abdomen: BS x 4, soft, NT/ND.  Musculoskeletal: No gross deformities, no edema.  Skin: Intact, warm, no rashes.  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 Interestingly, he had similar presentation in 2021 when also diagnosed with COVID.  Hypochloremia. Hypophosphatemia - being repleted. - Stop NS. - F/u Na q4hrs.  Large pericardial effusion - no tamponade physiology but high risk. - Cardiology following, appreciate the assistance.  - Pericardiocentesis planned for today.  Rhabdomyolysis - unclear etiology. No hx of trauma or prolonged downtime etc, but wife state he has been sedentary while on isolation for COVID. Improved with some hydration 2/22. - Stop NS. - Daily CK.   Best practice (evaluated daily):  Diet/type: Regular consistency (see orders) DVT prophylaxis: prophylactic heparin  GI prophylaxis: N/A Lines: N/A Foley:  N/A Code Status:  full code  Last date of multidisciplinary goals of care discussion: None yet.   Critical care time: 30 min.   Christopher Lara, [place] [facility] For pager details, please see AMION or use Epic chat  After 1900, please call [facility] for cross coverage needs 04/19/2022, 7:50 AM  Seen for some strange COVID-associated pericardial effusion and  hyponatremia. No events.  Resting comfortably, neurologically intact, BP good on no drips.  Sodium up 105 to 123 over 24h CK trending down Phos being repleted  Switch to 0.45 NS, try to keep under 130 through day but may not be possible if this is all acute  For pericardiocentesis today  Depending on labs and how procedure goes, may be stable to leave ICU tomorrow.  Erskine Emery MD PCCM

## 2022-04-20 ENCOUNTER — Encounter (HOSPITAL_COMMUNITY): Payer: Self-pay | Admitting: Cardiovascular Disease

## 2022-04-20 DIAGNOSIS — E871 Hypo-osmolality and hyponatremia: Secondary | ICD-10-CM | POA: Diagnosis not present

## 2022-04-20 DIAGNOSIS — I3139 Other pericardial effusion (noninflammatory): Secondary | ICD-10-CM | POA: Diagnosis not present

## 2022-04-20 LAB — CBC
HCT: 34.4 % — ABNORMAL LOW (ref 39.0–52.0)
Hemoglobin: 12.7 g/dL — ABNORMAL LOW (ref 13.0–17.0)
MCH: 32.5 pg (ref 26.0–34.0)
MCHC: 36.9 g/dL — ABNORMAL HIGH (ref 30.0–36.0)
MCV: 88 fL (ref 80.0–100.0)
Platelets: 288 10*3/uL (ref 150–400)
RBC: 3.91 MIL/uL — ABNORMAL LOW (ref 4.22–5.81)
RDW: 12.5 % (ref 11.5–15.5)
WBC: 6 10*3/uL (ref 4.0–10.5)
nRBC: 0 % (ref 0.0–0.2)

## 2022-04-20 LAB — LD, BODY FLUID (OTHER): LD, Body Fluid: 282 IU/L

## 2022-04-20 LAB — BASIC METABOLIC PANEL
Anion gap: 10 (ref 5–15)
BUN: 8 mg/dL (ref 6–20)
CO2: 20 mmol/L — ABNORMAL LOW (ref 22–32)
Calcium: 10.2 mg/dL (ref 8.9–10.3)
Chloride: 98 mmol/L (ref 98–111)
Creatinine, Ser: 1.01 mg/dL (ref 0.61–1.24)
GFR, Estimated: >60 mL/min (ref >60)
Glucose, Bld: 53 mg/dL — ABNORMAL LOW (ref 70–99)
Potassium: 3.9 mmol/L (ref 3.5–5.1)
Sodium: 128 mmol/L — ABNORMAL LOW (ref 135–145)

## 2022-04-20 LAB — CK: Total CK: 5410 U/L — ABNORMAL HIGH (ref 49–397)

## 2022-04-20 LAB — PHOSPHORUS: Phosphorus: 2 mg/dL — ABNORMAL LOW (ref 2.5–4.6)

## 2022-04-20 LAB — MAGNESIUM: Magnesium: 2.7 mg/dL — ABNORMAL HIGH (ref 1.7–2.4)

## 2022-04-20 LAB — GLUCOSE, BODY FLUID OTHER: Glucose, Body Fluid Other: 90 mg/dL

## 2022-04-20 LAB — PROTEIN, BODY FLUID (OTHER): Total Protein, Body Fluid Other: 6.1 g/dL

## 2022-04-20 NOTE — Hospital Course (Signed)
PMH of recurrent hyponatremia present to the hospital with complaints of confusion and weakness.  Found to have sodium level of 105. Diagnosed with COVID on 2/15.  Completed Paxlovid. Started feeling bad and fatigue.  Close drinking 500 mL water bottles 4-5 times a day. Admitted to the ICU.  Started on 3% normal saline.  Cardiology was consulted as the patient was found to have pericardial effusion.  Underwent pericardiocentesis on 2/22.  Follow-up limited echocardiogram shows improvement in effusion.

## 2022-04-20 NOTE — Progress Notes (Signed)
Rounding Note    Patient Name: Christopher Lara Date of Encounter: 04/20/2022  [place] Cardiologist: None New- Dr [place]   Subjective   Feels well. Denies any chest pain or dyspnea.   Inpatient Medications    Scheduled Meds:  heparin  5,000 Units Subcutaneous Q8H   sodium chloride flush  3 mL Intravenous Q12H   Continuous Infusions:  sodium chloride     PRN Meds: sodium chloride, docusate sodium, mouth rinse, polyethylene glycol, sodium chloride flush   Vital Signs    Vitals:   04/19/22 2204 04/20/22 0154 04/20/22 0601 04/20/22 0847  BP: 116/76 114/86 113/83 110/70  Pulse: 78 71 77 82  Resp: '18 18 18 18  '$ Temp: 98.1 F (36.7 C) 98.2 F (36.8 C) 98.4 F (36.9 C) 98.3 F (36.8 C)  TempSrc: Oral Oral Oral Oral  SpO2: 98% 100% 99% 99%  Weight: 77.4 kg  77.4 kg   Height:        Intake/Output Summary (Last 24 hours) at 04/20/2022 1024 Last data filed at 04/20/2022 0830 Gross per 24 hour  Intake 360 ml  Output 2150 ml  Net -1790 ml       04/20/2022    6:01 AM 04/19/2022   10:04 PM 04/19/2022    5:00 AM  Last 3 Weights  Weight (lbs) 170 lb 10.2 oz 170 lb 10.2 oz 170 lb 6.7 oz  Weight (kg) 77.4 kg 77.4 kg 77.3 kg      Telemetry    NSR - Personally Reviewed  ECG    NSR, low voltage - Personally Reviewed  Physical Exam   GEN: No acute distress.   Neck: No JVD Cardiac: RRR, no murmurs, rubs, or gallops. Pericardiocentesis site without tenderness or bruising.  Respiratory: Clear to auscultation bilaterally. GI: Soft, nontender, non-distended  MS: No edema; No deformity. Neuro:  Nonfocal  Psych: Normal affect   Labs    High Sensitivity Troponin:   Recent Labs  Lab 04/18/22 0430  TROPONINIHS 6      Chemistry Recent Labs  Lab 04/18/22 0430 04/18/22 0554 04/19/22 0257 04/19/22 0726 04/19/22 1607 04/19/22 1807 04/19/22 2007  NA 105*   < > 120*   < > 129* 128* 127*  K 3.7  --  3.8  --   --   --   --   CL 76*  --  93*  --   --    --   --   CO2 18*  --  19*  --   --   --   --   GLUCOSE 105*  --  78  --   --   --   --   BUN 6  --  5*  --   --   --   --   CREATININE 0.84  --  1.01  --   --   --   --   CALCIUM 8.9  --  8.9  --   --   --   --   MG  --   --  2.6*  --   --   --   --   PROT 8.1  --   --   --   --   --   --   ALBUMIN 4.6  --   --   --   --   --   --   AST 217*  --   --   --   --   --   --  ALT 45*  --   --   --   --   --   --   ALKPHOS 42  --   --   --   --   --   --   BILITOT 1.0  --   --   --   --   --   --   GFRNONAA >60  --  >60  --   --   --   --   ANIONGAP 11  --  8  --   --   --   --    < > = values in this interval not displayed.     Lipids No results for input(s): "CHOL", "TRIG", "HDL", "LABVLDL", "LDLCALC", "CHOLHDL" in the last 168 hours.  Hematology Recent Labs  Lab 04/18/22 0430 04/19/22 0257  WBC 6.7 5.3  RBC 4.38 3.86*  HGB 13.8 12.4*  HCT 36.6* 33.2*  MCV 83.6 86.0  MCH 31.5 32.1  MCHC 37.7* 37.3*  RDW 11.7 11.7  PLT 198 235    Thyroid No results for input(s): "TSH", "FREET4" in the last 168 hours.  BNP Recent Labs  Lab 04/18/22 0430  BNP 36.4     DDimer No results for input(s): "DDIMER" in the last 168 hours.   Radiology    ECHOCARDIOGRAM LIMITED  Result Date: 04/19/2022    ECHOCARDIOGRAM LIMITED REPORT   Patient Name:   Christopher Lara Date of Exam: 04/19/2022 Medical Rec #:  [id number]      Height:       66.0 in Accession #:    [id number]     Weight:       170.4 lb Date of Birth:  01-13-1976      BSA:          1.868 m Patient Age:    47 years       BP:           116/93 mmHg Patient Gender: M              HR:           71 bpm. Exam Location:  Inpatient Procedure: Limited Echo Indications:    I31.3 Pericardial effusion  History:        Patient has prior history of Echocardiogram examinations, most                 recent 04/18/2022.  Sonographer:    Raquel Sarna Senior RDCS Referring Phys: [place] Comments: Effusion check after  pericardiocentesis. IMPRESSIONS  1. Trivial pericardial effusion post pericardiocentesis.  2. Left ventricular ejection fraction, by estimation, is 65 to 70%. The left ventricle has normal function.  3. The inferior vena cava is normal in size with greater than 50% respiratory variability, suggesting right atrial pressure of 3 mmHg. FINDINGS  Left Ventricle: Left ventricular ejection fraction, by estimation, is 65 to 70%. The left ventricle has normal function. Pericardium: Trivial pericardial effusion post pericardiocentesis. Venous: The inferior vena cava is normal in size with greater than 50% respiratory variability, suggesting right atrial pressure of 3 mmHg. Additional Comments: There is a small pleural effusion.  Cherlynn Kaiser MD Electronically signed by Cherlynn Kaiser MD Signature Date/Time: 04/19/2022/5:06:29 PM    Final    CARDIAC CATHETERIZATION  Result Date: 04/19/2022 Large pericardial effusion Successful pericardiocentesis with removal of 1000 cc of straw colored fluid. No drain left in place. Recommendations: Follow up echo tomorrow.   ECHOCARDIOGRAM COMPLETE  Result Date: 04/18/2022  ECHOCARDIOGRAM REPORT   Patient Name:   Christopher Lara Date of Exam: 04/18/2022 Medical Rec #:  [id number]      Height:       66.0 in Accession #:    [id number]     Weight:       174.8 lb Date of Birth:  11/12/1975      BSA:          1.889 m Patient Age:    47 years       BP:           149/102 mmHg Patient Gender: M              HR:           67 bpm. Exam Location:  Inpatient Procedure: 2D Echo, Cardiac Doppler and Color Doppler Indications:    Pericardial effusion  History:        Patient has no prior history of Echocardiogram examinations.  Sonographer:    Meagan Baucom RDCS, FE, PE Referring Phys: [id number] RAHUL P DESAI IMPRESSIONS  1. Left ventricular ejection fraction, by estimation, is 70 to 75%. The left ventricle has hyperdynamic function. The left ventricle has no regional wall motion abnormalities.  There is mild concentric left ventricular hypertrophy. Left ventricular diastolic parameters are consistent with Grade II diastolic dysfunction (pseudonormalization). Elevated left ventricular end-diastolic pressure.  2. Right ventricular systolic function is normal. The right ventricular size is normal.  3. Very large pericardial effusion measuring up to 3.85 cm. There is mild RV diastolic collapse and respiratory flow variation across the tricuspid valve but not the mitral valve. The IVC is normal in size and collapses normally. Recommend clinical correlation. Current findings are not consistent with tamponade, but patient is at high risk for developing tamponade. Large pericardial effusion. The pericardial effusion is circumferential.  4. The mitral valve is normal in structure. No evidence of mitral valve regurgitation. No evidence of mitral stenosis.  5. The aortic valve is normal in structure. Aortic valve regurgitation is not visualized. No aortic stenosis is present.  6. The inferior vena cava is normal in size with greater than 50% respiratory variability, suggesting right atrial pressure of 3 mmHg. FINDINGS  Left Ventricle: Left ventricular ejection fraction, by estimation, is 70 to 75%. The left ventricle has hyperdynamic function. The left ventricle has no regional wall motion abnormalities. The left ventricular internal cavity size was normal in size. There is mild concentric left ventricular hypertrophy. Left ventricular diastolic parameters are consistent with Grade II diastolic dysfunction (pseudonormalization). Elevated left ventricular end-diastolic pressure. Right Ventricle: The right ventricular size is normal. No increase in right ventricular wall thickness. Right ventricular systolic function is normal. Left Atrium: Left atrial size was normal in size. Right Atrium: Right atrial size was normal in size. Pericardium: Very large pericardial effusion measuring up to 3.85 cm. There is mild RV  diastolic collapse and respiratory flow variation across the tricuspid valve but not the mitral valve. The IVC is normal in size and collapses normally. Recommend clinical correlation. Current findings are not consistent with tamponade, but patient is at high risk for developing tamponade. A large pericardial effusion is present. The pericardial effusion is circumferential. There is diastolic collapse of the right  atrial wall and excessive respiratory variation in the tricuspid valve spectral Doppler velocities. Mitral Valve: The mitral valve is normal in structure. No evidence of mitral valve regurgitation. No evidence of mitral valve stenosis. Tricuspid Valve: The tricuspid valve is normal in structure. Tricuspid  valve regurgitation is not demonstrated. No evidence of tricuspid stenosis. Aortic Valve: The aortic valve is normal in structure. Aortic valve regurgitation is not visualized. No aortic stenosis is present. Pulmonic Valve: The pulmonic valve was normal in structure. Pulmonic valve regurgitation is not visualized. No evidence of pulmonic stenosis. Aorta: The aortic root is normal in size and structure. Venous: The inferior vena cava is normal in size with greater than 50% respiratory variability, suggesting right atrial pressure of 3 mmHg. IAS/Shunts: No atrial level shunt detected by color flow Doppler.  LEFT VENTRICLE PLAX 2D LVIDd:         3.60 cm   Diastology LVIDs:         2.00 cm   LV e' medial:   3.42 cm/s LV PW:         1.20 cm   LV E/e' medial: 20.7 LV IVS:        1.10 cm LVOT diam:     2.20 cm LV SV:         75 LV SV Index:   40 LVOT Area:     3.80 cm  RIGHT VENTRICLE RV S prime:     8.24 cm/s LEFT ATRIUM           Index        RIGHT ATRIUM           Index LA Vol (A2C): 25.3 ml 13.40 ml/m  RA Area:     11.00 cm LA Vol (A4C): 34.6 ml 18.32 ml/m  RA Volume:   22.70 ml  12.02 ml/m  AORTIC VALVE LVOT Vmax:   92.10 cm/s LVOT Vmean:  60.000 cm/s LVOT VTI:    0.197 m  AORTA Ao Root diam: 3.20 cm  Ao Asc diam:  3.00 cm MITRAL VALVE MV Area (PHT): 2.64 cm    SHUNTS MV Decel Time: 287 msec    Systemic VTI:  0.20 m MV E velocity: 70.70 cm/s  Systemic Diam: 2.20 cm MV A velocity: 53.10 cm/s MV E/A ratio:  1.33 Skeet Latch MD Electronically signed by Skeet Latch MD Signature Date/Time: 04/18/2022/11:48:43 AM    Final     Cardiac Studies   Echocardiogram 04/18/2022: Impressions: 1. Left ventricular ejection fraction, by estimation, is 70 to 75%. The  left ventricle has hyperdynamic function. The left ventricle has no  regional wall motion abnormalities. There is mild concentric left  ventricular hypertrophy. Left ventricular  diastolic parameters are consistent with Grade II diastolic dysfunction  (pseudonormalization). Elevated left ventricular end-diastolic pressure.   2. Right ventricular systolic function is normal. The right ventricular  size is normal.   3. Very large pericardial effusion measuring up to 3.85 cm. There is mild  RV diastolic collapse and respiratory flow variation across the tricuspid  valve but not the mitral valve. The IVC is normal in size and collapses  normally. Recommend clinical  correlation. Current findings are not consistent with tamponade, but  patient is at high risk for developing tamponade. Large pericardial  effusion. The pericardial effusion is circumferential.   4. The mitral valve is normal in structure. No evidence of mitral valve  regurgitation. No evidence of mitral stenosis.   5. The aortic valve is normal in structure. Aortic valve regurgitation is  not visualized. No aortic stenosis is present.   6. The inferior vena cava is normal in size with greater than 50%  respiratory variability, suggesting right atrial pressure of 3 mmHg.   Patient Profile     46  y.o. male with a history of severe hyponatremia in setting of COVID pneumonia in 02/2019 but no other significant past medical history who was admitted on 04/18/2022 for severe  hyponatremia after presenting with altered mental status. He was also incidentally found to have a large pericardial effusion for which Cardiology has been asked to see for at the request of Dr. Tacy Learn.   Assessment & Plan    Large Pericardial Effusion Patient was admitted with severe hyponatremia after presenting with altered mental status. Echo showed normal LV function with large pericardial effusion measuring up to 3.85 cm with mild RV diastolic collapse and respiratory flow variation across the tricuspid valve but not the mitral valve. IVC is normal and size and collapses normally. Current findings not consistent with tamponade but he is at high risk for developing tamponade.  - Hemodynamically stable. - Pericardial effusion possibly due to recent COVID infection.  - underwent pericardiocentesis yesterday with removal of 1 liter of fluid. Resolution of effusion on Echo. Initial analysis c/w transudative effusion. Also noted small pleural effusion - plan limited Echo today - patient stable for DC from our standpoint. Will arrange follow up in our office in 3 weeks.    Severe Hyponatremia Sodium 105 on admission. 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  - Management per PCCM. - sodium improved to 127 today   Rhabdomyolysis  CK elevated at 15,086. C/w rhabdo. CK improved to 7900 - Management per PCCM.     [place] will sign off.   Medication Recommendations:  none Other recommendations (labs, testing, etc):  none Follow up as an outpatient:  3 weeks with [facility]   For questions or updates, please contact [place] Please consult [email or web address] for contact info under        Signed, Koa Zoeller [place], MD  04/20/2022, 10:24 AM

## 2022-04-20 NOTE — Progress Notes (Signed)
[facility] Progress Note Patient: Christopher Lara [id number] DOB: [birth year] DOA: 04/18/2022  DOS: the patient was seen and examined on 04/20/2022  Brief hospital course: PMH of recurrent hyponatremia present to the hospital with complaints of confusion and weakness.  Found to have sodium level of 105. Diagnosed with COVID on 2/15.  Completed Paxlovid. Started feeling bad and fatigue.  Close drinking 500 mL water bottles 4-5 times a day. Admitted to the ICU.  Started on 3% normal saline.  Cardiology was consulted as the patient was found to have pericardial effusion.  Underwent pericardiocentesis on 2/22.  Follow-up limited echocardiogram shows improvement in effusion.  Assessment and Plan: SIADH. Hypoosmolar hyponatremia. 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 Continuing fluid restriction. Recommend to not utilize electrolyte-based water solutions at home. Treated with 3% normal saline followed by normal saline. Sodium level stable. Monitor.  Nontraumatic rhabdomyolysis. Neurology not clear. CK level still elevated to 5000. Improving from prior. Patient currently not receiving any IV fluids. Will monitor.  Large pericardial effusion. Likely in the setting of COVID infection.  Possible pericarditis. Currently no symptoms. Underwent pericardiocentesis. Monitor.  Subjective: No nausea no vomiting no fever no chills.  No chest pain.  Physical Exam: General: in Mild distress, No Rash Cardiovascular: S1 and S2 Present, No Murmur Respiratory: Good respiratory effort, Bilateral Air entry present. No Crackles, No wheezes Abdomen: Bowel Sound present, No tenderness Extremities: No edema Neuro: Alert and oriented x3, no new focal deficit  Data Reviewed: I have Reviewed nursing notes, Vitals, and Lab results. Since last encounter, pertinent lab results CBC and BMP   . I have ordered test including CBC and BMP  .   Disposition: Status is:  Inpatient Remains inpatient appropriate because: Need for improvement in CK levels further.  Unable to treat with aggressive IV hydration.  heparin injection 5,000 Units Start: 04/20/22 0600 SCDs Start: 04/18/22 1041   Family Communication: Wife at bedside Level of care: Med-Surg  Vitals:   04/20/22 0154 04/20/22 0601 04/20/22 0847 04/20/22 1727  BP: 114/86 113/83 110/70 (!) 131/94  Pulse: 71 77 82 80  Resp: '18 18 18 18  '$ Temp: 98.2 F (36.8 C) 98.4 F (36.9 C) 98.3 F (36.8 C) 98.3 F (36.8 C)  TempSrc: Oral Oral Oral Oral  SpO2: 100% 99% 99% 100%  Weight:  77.4 kg    Height:         Author: Berle Mull, MD 04/20/2022 7:05 PM  Please look on [email or web address] to find out who is on call.

## 2022-04-20 NOTE — Plan of Care (Signed)
  Problem: Education: Goal: Knowledge of General Education information will improve Description: Including pain rating scale, medication(s)/side effects and non-pharmacologic comfort measures Outcome: Progressing   Problem: Health Behavior/Discharge Planning: Goal: Ability to manage health-related needs will improve Outcome: Progressing   Problem: Clinical Measurements: Goal: Ability to maintain clinical measurements within normal limits will improve Outcome: Progressing Goal: Will remain free from infection Outcome: Progressing Goal: Diagnostic test results will improve Outcome: Progressing Goal: Respiratory complications will improve Outcome: Progressing Goal: Cardiovascular complication will be avoided Outcome: Progressing   Problem: Activity: Goal: Risk for activity intolerance will decrease Outcome: Progressing   Problem: Nutrition: Goal: Adequate nutrition will be maintained Outcome: Progressing   Problem: Coping: Goal: Level of anxiety will decrease Outcome: Progressing   Problem: Elimination: Goal: Will not experience complications related to bowel motility Outcome: Progressing Goal: Will not experience complications related to urinary retention Outcome: Progressing   Problem: Safety: Goal: Ability to remain free from injury will improve Outcome: Progressing   Problem: Skin Integrity: Goal: Risk for impaired skin integrity will decrease Outcome: Progressing   Problem: Education: Goal: Understanding of CV disease, CV risk reduction, and recovery process will improve Outcome: Progressing Goal: Individualized Educational Video(s) Outcome: Progressing   Problem: Activity: Goal: Ability to return to baseline activity level will improve Outcome: Progressing   Problem: Cardiovascular: Goal: Ability to achieve and maintain adequate cardiovascular perfusion will improve Outcome: Progressing Goal: Vascular access site(s) Level 0-1 will be maintained Outcome:  Progressing   Problem: Health Behavior/Discharge Planning: Goal: Ability to safely manage health-related needs after discharge will improve Outcome: Progressing

## 2022-04-21 DIAGNOSIS — E871 Hypo-osmolality and hyponatremia: Secondary | ICD-10-CM | POA: Diagnosis not present

## 2022-04-21 LAB — BASIC METABOLIC PANEL
Anion gap: 7 (ref 5–15)
BUN: 10 mg/dL (ref 6–20)
CO2: 24 mmol/L (ref 22–32)
Calcium: 10 mg/dL (ref 8.9–10.3)
Chloride: 97 mmol/L — ABNORMAL LOW (ref 98–111)
Creatinine, Ser: 0.91 mg/dL (ref 0.61–1.24)
GFR, Estimated: >60 mL/min (ref >60)
Glucose, Bld: 82 mg/dL (ref 70–99)
Potassium: 4.1 mmol/L (ref 3.5–5.1)
Sodium: 128 mmol/L — ABNORMAL LOW (ref 135–145)

## 2022-04-21 LAB — CK: Total CK: 4252 U/L — ABNORMAL HIGH (ref 49–397)

## 2022-04-21 LAB — CBC
HCT: 35.5 % — ABNORMAL LOW (ref 39.0–52.0)
Hemoglobin: 13.2 g/dL (ref 13.0–17.0)
MCH: 33.7 pg (ref 26.0–34.0)
MCHC: 37.2 g/dL — ABNORMAL HIGH (ref 30.0–36.0)
MCV: 90.6 fL (ref 80.0–100.0)
Platelets: 276 10*3/uL (ref 150–400)
RBC: 3.92 MIL/uL — ABNORMAL LOW (ref 4.22–5.81)
RDW: 12.7 % (ref 11.5–15.5)
WBC: 6.3 10*3/uL (ref 4.0–10.5)
nRBC: 0 % (ref 0.0–0.2)

## 2022-04-21 MED ORDER — ACETAMINOPHEN ER 650 MG PO TBCR: 650.0000 mg | EXTENDED_RELEASE_TABLET | ORAL | Status: DC | PRN

## 2022-04-21 NOTE — Plan of Care (Signed)

## 2022-04-21 NOTE — Progress Notes (Signed)
AVS given and explained to patient and family, awaiting for ride.

## 2022-04-23 ENCOUNTER — Inpatient Hospital Stay: Payer: Federal, State, Local not specified - PPO | Admitting: Physician Assistant

## 2022-04-23 ENCOUNTER — Telehealth: Payer: Self-pay

## 2022-04-23 NOTE — Discharge Summary (Signed)
Physician Discharge Summary   Patient: Christopher Lara MRN: [id number] DOB: [birth year]  Admit date:     04/18/2022  Discharge date: 04/21/2022  Discharge Physician: Berle Mull  PCP: Nicolette Bang, MD (Inactive)  Recommendations at discharge: Follow-up with PCP in 1 week with repeat BMP and CK level. Follow-up with cardiology as recommended.   Follow-up Information     Nicolette Bang, MD. Schedule an appointment as soon as possible for a visit in 1 week(s).   Specialty: Family Medicine Why: with BMP and CK level. Contact information: [place] [address] [phone]         [place] [facility]. Schedule an appointment as soon as possible for a visit in 3 week(s).   Specialty: Cardiology Why: call office to arrange follow up. Contact information: [address], [place] [id number] [place] [phone]                Unresulted Labs (From admission, onward)     Start     Ordered   04/20/22 0500  Lipoprotein A (LPA)  Tomorrow morning,   R       Question:  Specimen collection method  Answer:  Lab=Lab collect   04/19/22 1259            Discharge Diagnoses: Principal Problem:   Hyponatremia Active Problems:   Pericardial effusion Nontraumatic rhabdomyolysis.  Hospital Course: PMH of recurrent hyponatremia present to the hospital with complaints of confusion and weakness.  Found to have sodium level of 105. Diagnosed with COVID on 2/15.  Completed Paxlovid. Started feeling bad and fatigue.  Close drinking 500 mL water bottles 4-5 times a day. Admitted to the ICU.  Started on 3% normal saline.  Cardiology was consulted as the patient was found to have pericardial effusion.  Underwent pericardiocentesis on 2/22.  Follow-up limited echocardiogram shows improvement in effusion.  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 Hypoosmolar hyponatremia. Presents  with confusion.  On presentation sodium level 105.  Treated with 3% saline. Followed by this patient also received normal saline. Currently sodium level 128 without any intervention. 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 Continuing fluid restriction. Recommend to not utilize electrolyte-based water solutions at home.   Nontraumatic rhabdomyolysis. Neurology not clear. CK level still elevated to 4000. Improving from prior value of 15,000 at the time of admission. Improving without any fluids.  Will monitor. Activity limitation recommended until cleared by PCP and until CK levels are trending down to normal levels.   Large pericardial effusion. Likely in the setting of COVID infection.  Possible pericarditis. Currently no symptoms. Underwent pericardiocentesis.  Repeat echocardiogram shows improvement.  Cardiology recommended outpatient follow-up. Monitor.   Consultants:  Cardiology Primary admission with PCCM  Procedures performed:  Pericardiocentesis Echocardiogram  DISCHARGE MEDICATION: Allergies as of 04/21/2022   No Known Allergies      Medication List     STOP taking these medications    ibuprofen 800 MG tablet Commonly known as: ADVIL   promethazine-dextromethorphan 6.25-15 MG/5ML syrup Commonly known as: PROMETHAZINE-DM   tamsulosin 0.4 MG Caps capsule Commonly known as: FLOMAX       TAKE these medications    acetaminophen 650 MG CR tablet Commonly known as: TYLENOL Take 1 tablet (650 mg total) by mouth every 4 (four) hours as needed for pain. What changed: how much to take       Disposition: Home Diet recommendation:  Cardiac diet  Discharge Exam: Vitals:   04/21/22 0434 04/21/22 0500 04/21/22 0559 04/21/22 0951  BP: (!) 129/96  (!) 129/96 128/84  Pulse: 77  71 72  Resp: '18  18 17  '$ Temp: 98.2 F (36.8 C)  98.4 F (36.9 C) 98.4 F (36.9 C)  TempSrc: Oral  Oral   SpO2: 100%  100% 100%  Weight:  77.4 kg     Height:       General: Appear in no distress; no visible Abnormal Neck Mass Or lumps, Conjunctiva normal Cardiovascular: S1 and S2 Present, no Murmur, Respiratory: good respiratory effort, Bilateral Air entry present and CTA, no Crackles, no wheezes Abdomen: Bowel Sound present, Non tender  Extremities: no Pedal edema Neurology: alert and oriented to time, place, and person  Filed Weights   04/20/22 0601 04/20/22 2124 04/21/22 0500  Weight: 77.4 kg 77.4 kg 77.4 kg   Condition at discharge: stable  The results of significant diagnostics from this hospitalization (including imaging, microbiology, ancillary and laboratory) are listed below for reference.   Imaging Studies: ECHOCARDIOGRAM LIMITED  Result Date: 04/19/2022    ECHOCARDIOGRAM LIMITED REPORT   Patient Name:   Christopher Lara Date of Exam: 04/19/2022 Medical Rec #:  [id number]      Height:       66.0 in Accession #:    [id number]     Weight:       170.4 lb Date of Birth:  [birth year]      BSA:          1.868 m Patient Age:    47 years       BP:           116/93 mmHg Patient Gender: M              HR:           71 bpm. Exam Location:  Inpatient Procedure: Limited Echo Indications:    I31.3 Pericardial effusion  History:        Patient has prior history of Echocardiogram examinations, most                 recent 04/18/2022.  Sonographer:    Raquel Sarna Senior RDCS Referring Phys: [place] Comments: Effusion check after pericardiocentesis. IMPRESSIONS  1. Trivial pericardial effusion post pericardiocentesis.  2. Left ventricular ejection fraction, by estimation, is 65 to 70%. The left ventricle has normal function.  3. The inferior vena cava is normal in size with greater than 50% respiratory variability, suggesting right atrial pressure of 3 mmHg. FINDINGS  Left Ventricle: Left ventricular ejection fraction, by estimation, is 65 to 70%. The left ventricle has normal function. Pericardium: Trivial pericardial effusion  post pericardiocentesis. Venous: The inferior vena cava is normal in size with greater than 50% respiratory variability, suggesting right atrial pressure of 3 mmHg. Additional Comments: There is a small pleural effusion.  Cherlynn Kaiser MD Electronically signed by Cherlynn Kaiser MD Signature Date/Time: 04/19/2022/5:06:29 PM    Final    CARDIAC CATHETERIZATION  Result Date: 04/19/2022 Large pericardial effusion Successful pericardiocentesis with removal of 1000 cc of straw colored fluid. No drain left in place. Recommendations: Follow up echo tomorrow.   ECHOCARDIOGRAM COMPLETE  Result Date: 04/18/2022    ECHOCARDIOGRAM REPORT   Patient Name:   BEACHER GIGLIA Date of Exam: 04/18/2022 Medical Rec #:  [id number]      Height:       66.0 in Accession #:  [id number]     Weight:       174.8 lb Date of Birth:  [birth year]      BSA:          1.889 m Patient Age:    76 years       BP:           149/102 mmHg Patient Gender: M              HR:           67 bpm. Exam Location:  Inpatient Procedure: 2D Echo, Cardiac Doppler and Color Doppler Indications:    Pericardial effusion  History:        Patient has no prior history of Echocardiogram examinations.  Sonographer:    Meagan Baucom RDCS, FE, PE Referring Phys: [id number] RAHUL P DESAI IMPRESSIONS  1. Left ventricular ejection fraction, by estimation, is 70 to 75%. The left ventricle has hyperdynamic function. The left ventricle has no regional wall motion abnormalities. There is mild concentric left ventricular hypertrophy. Left ventricular diastolic parameters are consistent with Grade II diastolic dysfunction (pseudonormalization). Elevated left ventricular end-diastolic pressure.  2. Right ventricular systolic function is normal. The right ventricular size is normal.  3. Very large pericardial effusion measuring up to 3.85 cm. There is mild RV diastolic collapse and respiratory flow variation across the tricuspid valve but not the mitral valve. The IVC is normal in  size and collapses normally. Recommend clinical correlation. Current findings are not consistent with tamponade, but patient is at high risk for developing tamponade. Large pericardial effusion. The pericardial effusion is circumferential.  4. The mitral valve is normal in structure. No evidence of mitral valve regurgitation. No evidence of mitral stenosis.  5. The aortic valve is normal in structure. Aortic valve regurgitation is not visualized. No aortic stenosis is present.  6. The inferior vena cava is normal in size with greater than 50% respiratory variability, suggesting right atrial pressure of 3 mmHg. FINDINGS  Left Ventricle: Left ventricular ejection fraction, by estimation, is 70 to 75%. The left ventricle has hyperdynamic function. The left ventricle has no regional wall motion abnormalities. The left ventricular internal cavity size was normal in size. There is mild concentric left ventricular hypertrophy. Left ventricular diastolic parameters are consistent with Grade II diastolic dysfunction (pseudonormalization). Elevated left ventricular end-diastolic pressure. Right Ventricle: The right ventricular size is normal. No increase in right ventricular wall thickness. Right ventricular systolic function is normal. Left Atrium: Left atrial size was normal in size. Right Atrium: Right atrial size was normal in size. Pericardium: Very large pericardial effusion measuring up to 3.85 cm. There is mild RV diastolic collapse and respiratory flow variation across the tricuspid valve but not the mitral valve. The IVC is normal in size and collapses normally. Recommend clinical correlation. Current findings are not consistent with tamponade, but patient is at high risk for developing tamponade. A large pericardial effusion is present. The pericardial effusion is circumferential. There is diastolic collapse of the right  atrial wall and excessive respiratory variation in the tricuspid valve spectral Doppler  velocities. Mitral Valve: The mitral valve is normal in structure. No evidence of mitral valve regurgitation. No evidence of mitral valve stenosis. Tricuspid Valve: The tricuspid valve is normal in structure. Tricuspid valve regurgitation is not demonstrated. No evidence of tricuspid stenosis. Aortic Valve: The aortic valve is normal in structure. Aortic valve regurgitation is not visualized. No aortic stenosis is present. Pulmonic Valve: The pulmonic valve was normal in structure.  Pulmonic valve regurgitation is not visualized. No evidence of pulmonic stenosis. Aorta: The aortic root is normal in size and structure. Venous: The inferior vena cava is normal in size with greater than 50% respiratory variability, suggesting right atrial pressure of 3 mmHg. IAS/Shunts: No atrial level shunt detected by color flow Doppler.  LEFT VENTRICLE PLAX 2D LVIDd:         3.60 cm   Diastology LVIDs:         2.00 cm   LV e' medial:   3.42 cm/s LV PW:         1.20 cm   LV E/e' medial: 20.7 LV IVS:        1.10 cm LVOT diam:     2.20 cm LV SV:         75 LV SV Index:   40 LVOT Area:     3.80 cm  RIGHT VENTRICLE RV S prime:     8.24 cm/s LEFT ATRIUM           Index        RIGHT ATRIUM           Index LA Vol (A2C): 25.3 ml 13.40 ml/m  RA Area:     11.00 cm LA Vol (A4C): 34.6 ml 18.32 ml/m  RA Volume:   22.70 ml  12.02 ml/m  AORTIC VALVE LVOT Vmax:   92.10 cm/s LVOT Vmean:  60.000 cm/s LVOT VTI:    0.197 m  AORTA Ao Root diam: 3.20 cm Ao Asc diam:  3.00 cm MITRAL VALVE MV Area (PHT): 2.64 cm    SHUNTS MV Decel Time: 287 msec    Systemic VTI:  0.20 m MV E velocity: 70.70 cm/s  Systemic Diam: 2.20 cm MV A velocity: 53.10 cm/s MV E/A ratio:  1.33 Skeet Latch MD Electronically signed by Skeet Latch MD Signature Date/Time: 04/18/2022/11:48:43 AM    Final    [place] EKG SITE RITE  Result Date: 04/18/2022 If Site Rite image not attached, placement could not be confirmed due to current cardiac rhythm.  DG Chest 2 View  Result  Date: 04/18/2022 CLINICAL DATA:  47 year old male with recent COVID-19. weakness, [place]. EXAM: CHEST - 2 VIEW COMPARISON:  Portable chest 03/15/2019. CT Abdomen and Pelvis 01/13/2020 FINDINGS: Semi upright AP and lateral views at 0456 hours. New cardiomegaly since 2021. Possible pericardial effusion. Other mediastinal contours are within normal limits. Visualized tracheal air column is within normal limits. Stable low lung volumes. Allowing for portable technique the lungs are clear. No pulmonary edema or pleural effusion. Chronic T10 anterior wedge compression fracture was present on the previous CT Abdomen and Pelvis. No acute osseous abnormality identified. Negative visible bowel gas. IMPRESSION: 1. Cardiomegaly is new since 2021, consider pericardial effusion. 2. No other acute cardiopulmonary abnormality. 3. Chronic T10 compression fracture. Electronically Signed   By: Genevie Ann M.D.   On: 04/18/2022 05:35    Microbiology: Results for orders placed or performed during the hospital encounter of 04/18/22  MRSA Next Gen by PCR, Nasal     Status: None   Collection Time: 04/18/22 10:46 AM   Specimen: Nasal Mucosa; Nasal Swab  Result Value Ref Range Status   MRSA by PCR Next Gen NOT DETECTED NOT DETECTED Final    Comment: (NOTE) The GeneXpert MRSA Assay (FDA approved for NASAL specimens only), is one component of a comprehensive MRSA colonization surveillance program. It is not intended to diagnose MRSA infection nor to guide or monitor treatment for MRSA  infections. Test performance is not FDA approved in patients less than 70 years old. Performed at [facility], [place] [address], [place], [place] [address]   Culture, body fluid w Gram Stain-bottle     Status: None (Preliminary result)   Collection Time: 04/19/22 11:51 AM   Specimen: Pericardial  Result Value Ref Range Status   Specimen Description PERICARDIAL  Final   Special Requests NONE  Final   Culture   Final    NO GROWTH 4  DAYS Performed at [facility] [address], [place], [place] [address]    Report Status PENDING  Incomplete  Gram stain     Status: None   Collection Time: 04/19/22 11:51 AM   Specimen: Pericardial  Result Value Ref Range Status   Specimen Description PERICARDIAL  Final   Special Requests NONE  Final   Gram Stain   Final    RARE WBC PRESENT, PREDOMINANTLY MONONUCLEAR NO ORGANISMS SEEN Performed at [facility], [place] [address] [place], [place] [address]    Report Status 04/19/2022 FINAL  Final   Labs: CBC: Recent Labs  Lab 04/18/22 0430 04/19/22 0257 04/20/22 0949 04/21/22 0331  WBC 6.7 5.3 6.0 6.3  NEUTROABS 4.2  --   --   --   HGB 13.8 12.4* 12.7* 13.2  HCT 36.6* 33.2* 34.4* 35.5*  MCV 83.6 86.0 88.0 90.6  PLT 198 235 288 [id number]   Basic Metabolic Panel: Recent Labs  Lab 04/18/22 0430 04/18/22 0554 04/19/22 0257 04/19/22 0726 04/19/22 1607 04/19/22 1807 04/19/22 2007 04/20/22 0949 04/21/22 0331  NA 105*   < > 120*   < > 129* 128* 127* 128* 128*  K 3.7  --  3.8  --   --   --   --  3.9 4.1  CL 76*  --  93*  --   --   --   --  98 97*  CO2 18*  --  19*  --   --   --   --  20* 24  GLUCOSE 105*  --  78  --   --   --   --  53* 82  BUN 6  --  5*  --   --   --   --  8 10  CREATININE 0.84  --  1.01  --   --   --   --  1.01 0.91  CALCIUM 8.9  --  8.9  --   --   --   --  10.2 10.0  MG  --   --  2.6*  --   --   --   --  2.7*  --   PHOS  --   --  1.5*  --   --   --  1.6* 2.0*  --    < > = values in this interval not displayed.   Liver Function Tests: Recent Labs  Lab 04/18/22 0430  AST 217*  ALT 45*  ALKPHOS 42  BILITOT 1.0  PROT 8.1  ALBUMIN 4.6   CBG: No results for input(s): "GLUCAP" in the last 168 hours.  Discharge time spent: greater than 30 minutes.  Signed: Berle Mull, MD [facility] 04/21/2022

## 2022-04-23 NOTE — Transitions of Care (Post Inpatient/ED Visit) (Signed)
   04/23/2022  Name: ANICETO DENNING MRN: [id number] DOB: Jul 23, 1975  Today's TOC FU Call Status: Today's TOC FU Call Status:: Unsuccessul Call (1st Attempt) Unsuccessful Call (1st Attempt) Date: 04/23/22  Attempted to reach the patient regarding the most recent Inpatient/ED visit.  Follow Up Plan: Additional outreach attempts will be made to reach the patient to complete the Transitions of Care (Post Inpatient/ED visit) call.     Enzo Montgomery, RN,BSN,CCM [facility] Care Management Care Management Community Coordinator Direct Phone: [phone] Toll Free: [phone] Fax: [phone]

## 2022-04-24 ENCOUNTER — Telehealth: Payer: Self-pay

## 2022-04-24 ENCOUNTER — Ambulatory Visit: Payer: Federal, State, Local not specified - PPO | Attending: General Practice | Admitting: Physician Assistant

## 2022-04-24 ENCOUNTER — Encounter: Payer: Self-pay | Admitting: Physician Assistant

## 2022-04-24 VITALS — BP 134/88 | HR 73 | Ht 68.0 in | Wt 181.2 lb

## 2022-04-24 DIAGNOSIS — D649 Anemia, unspecified: Secondary | ICD-10-CM

## 2022-04-24 DIAGNOSIS — I3139 Other pericardial effusion (noninflammatory): Secondary | ICD-10-CM | POA: Diagnosis not present

## 2022-04-24 DIAGNOSIS — E871 Hypo-osmolality and hyponatremia: Secondary | ICD-10-CM | POA: Diagnosis not present

## 2022-04-24 LAB — CULTURE, BODY FLUID W GRAM STAIN -BOTTLE: Culture: NO GROWTH

## 2022-04-24 LAB — LIPOPROTEIN A (LPA)

## 2022-04-24 LAB — CYTOLOGY - NON PAP

## 2022-04-24 NOTE — Progress Notes (Signed)
Cardiology Office Note:    Date:  04/25/2022   ID:  Christopher Lara, DOB [birth year], MRN [id number]  PCP:  Nicolette Bang, MD (Inactive)   [place] [facility] Cardiologist:  Peter Martinique, MD     Referring MD: No ref. provider found   Chief Complaint  Patient presents with   Hospitalization Follow-up    Seen for Dr. Martinique    History of Present Illness:    Christopher Lara is a 47 y.o. male with a hx of severe hyponatremia in the setting of COVID-pneumonia in January 2021.  He was seen at [facility] long ED in December 2022 for syncopal episode that occurred after he took a hot shower.  He developed tunnel vision and a sensation that things were going dark before passed out and hit his face in the fall.  He was prescribed phosphate supplement and recommend outpatient follow-up with PCP. He did have COVID again on 04/12/2022 and was treated with Paxlovid. Patient was admitted recently in February 2024 was severe hyponatremia after presenting with altered mental status.  He was found to have a large pericardial effusion for which cardiology service was consulted.  Upon arrival, he was hypertensive.  EKG showed sinus rhythm with nonspecific T wave changes and a prolonged QTc of 523 ms.  Serial troponin negative.  Sodium markedly low at 105.  CK significantly elevated to 15,000 consistent with rhabdomyolysis.  BNP normal.  He was treated with hypertonic saline.  Echocardiogram showed EF 70 to 75%, grade 2 DD, large pericardial effusion measuring up to 3.85 cm with mild RV diastolic collapse and the respiratory flow variation across the tricuspid valve.  Findings not consistent with cardiac tamponade.  Sodium improved to 120.  Patient eventually underwent pericardiocentesis with removal of 1 L of fluid on 04/19/2022. Initial analysis consistent with transudative effusion.  Limited echocardiogram obtained on 04/19/2022 demonstrated EF 65 to 70%, trivial pericardial effusion.  [name].  He denies any chest pain or shortness of breath.  His symptom prior to going to the hospital was arm and chest tightness.  He never really had any significant shortness of breath.  I recommend a CBC and a basic metabolic panel in 1 week.  He will need a 4-week limited echocardiogram to make sure pericardial effusion does not come back.  He may return to work on 05/07/2022 without further restrictions.  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  He may follow-up in 6 weeks with APP and 4 months with Dr. Martinique.  Initial blood pressure was borderline high, repeat blood pressure was 134/88.  He has been instructed to monitor his blood pressure at home.  If his systolic blood pressures persistently high, will consider addition of a blood pressure medication.  Past Medical History:  Diagnosis Date   Kidney stone    Kidney stone    Kidney stones    Kidney stones     Past Surgical History:  Procedure Laterality Date   PERICARDIOCENTESIS N/A 04/19/2022   Procedure: PERICARDIOCENTESIS;  Surgeon: Burnell Blanks, MD;  Location: [place] CV LAB;  Service: Cardiovascular;  Laterality: N/A;    Current Medications: No outpatient medications have been marked as taking for the 04/24/22 encounter (Office Visit) with Almyra Deforest, [place].     Allergies:   Patient has no known allergies.   Social History   Socioeconomic History   Marital status: Married    Spouse name: Not  on file   Number of children: Not on file   Years of education: Not on file   Highest education level: Not on file  Occupational History   Not on file  Tobacco Use   Smoking status: Never   Smokeless tobacco: Never  Substance and Sexual Activity   Alcohol use: No   Drug use: No   Sexual activity: Not on file  Other Topics Concern   Not on file  Social History Narrative   Not on file   Social Determinants of Health   Financial Resource Strain: Not on file  Food Insecurity:  No Food Insecurity (04/18/2022)   Hunger Vital Sign    Worried About Running Out of Food in the Last Year: Never true    Ran Out of Food in the Last Year: Never true  Transportation Needs: No Transportation Needs (04/18/2022)   PRAPARE - Hydrologist (Medical): No    Lack of Transportation (Non-Medical): No  Physical Activity: Not on file  Stress: Not on file  Social Connections: Not on file     Family History: The patient's family history includes Cancer in his brother; Heart attack in his father; Heart failure in his father.  ROS:   Please see the history of present illness.     All other systems reviewed and are negative.  EKGs/Labs/Other Studies Reviewed:    The following studies were reviewed today:  Limited echo 04/19/2022  1. Trivial pericardial effusion post pericardiocentesis.   2. Left ventricular ejection fraction, by estimation, is 65 to 70%. The  left ventricle has normal function.   3. The inferior vena cava is normal in size with greater than 50%  respiratory variability, suggesting right atrial pressure of 3 mmHg.    Cath 04/19/2022 Large pericardial effusion Successful pericardiocentesis with removal of 1000 cc of straw colored fluid. No drain left in place.    Recommendations: Follow up echo tomorrow.    EKG:  EKG is not ordered today.    Recent Labs: 04/18/2022: ALT 45; B Natriuretic Peptide 36.4 04/20/2022: Magnesium 2.7 04/21/2022: BUN 10; Creatinine, Ser 0.91; Hemoglobin 13.2; Platelets 276; Potassium 4.1; Sodium 128  Recent Lipid Panel No results found for: "CHOL", "TRIG", "HDL", "CHOLHDL", "VLDL", "LDLCALC", "LDLDIRECT"   Risk Assessment/Calculations:           Physical Exam:    VS:  BP 134/88   Pulse 73   Ht '5\' 8"'$  (1.727 m)   Wt 181 lb 3.2 oz (82.2 kg)   SpO2 98%   BMI 27.55 kg/m         Wt Readings from Last 3 Encounters:  04/24/22 181 lb 3.2 oz (82.2 kg)  04/21/22 170 lb 10.2 oz (77.4 kg)  04/03/21  177 lb (80.3 kg)     GEN:  Well nourished, well developed in no acute distress HEENT: Normal NECK: No JVD; No carotid bruits LYMPHATICS: No lymphadenopathy CARDIAC: RRR, no murmurs, rubs, gallops RESPIRATORY:  Clear to auscultation without rales, wheezing or rhonchi  ABDOMEN: Soft, non-tender, non-distended MUSCULOSKELETAL:  No edema; No deformity  SKIN: Warm and dry NEUROLOGIC:  Alert and oriented x 3 PSYCHIATRIC:  Normal affect   ASSESSMENT:    1. Pericardial effusion   2. Hyponatremia   3. Mild anemia    PLAN:    In order of problems listed above:  Pericardial effusion: Recently underwent pericardiocentesis.  Suspect large pericardial effusion related to recent COVID infection.  He is recovering well and denies  any shortness of breath or chest discomfort.  Will repeat limited echocardiogram in 4 weeks to make sure pericardial effusion does not increase.  Encouraged light activity.  If no significant issue in the next week, may return to work on 05/07/2022.  Hyponatremia: Currently on electrolyte water.  Will repeat basic metabolic panel in 1 week.  If sodium is stable, may return to work on 05/07/2022  Mild anemia: Obtain CBC in 1 week           Medication Adjustments/Labs and Tests Ordered: Current medicines are reviewed at length with the patient today.  Concerns regarding medicines are outlined above.  Orders Placed This Encounter  Procedures   Basic metabolic panel   CBC   ECHOCARDIOGRAM LIMITED   No orders of the defined types were placed in this encounter.   Patient Instructions  Medication Instructions:  The current medical regimen is effective;  continue present plan and medications.  *If you need a refill on your cardiac medications before your next appointment, please call your pharmacy*   Lab Work: CBC, BMET in 1 week - no lab appointment needed.   If you have labs (blood work) drawn today and your tests are completely normal, you will receive your  results only by: [place] (if you have MyChart) OR A paper copy in the mail If you have any lab test that is abnormal or we need to change your treatment, we will call you to review the results.   Testing/Procedures:  LIMITED ECHOCARDIOGRAM in 4 weeks   Follow-Up: At [facility], you and your health needs are our priority.  As part of our continuing mission to provide you with exceptional heart care, we have created designated Provider Care Teams.  These Care Teams include your primary Cardiologist (physician) and Advanced Practice Providers (APPs -  Physician Assistants and Nurse Practitioners) who all work together to provide you with the care you need, when you need it.  We recommend signing up for the patient portal called "MyChart".  Sign up information is provided on this After Visit Summary.  MyChart is used to connect with patients for Virtual Visits (Telemedicine).  Patients are able to view lab/test results, encounter notes, upcoming appointments, etc.  Non-urgent messages can be sent to your provider as well.   To learn more about what you can do with MyChart, go to [email or web address].    Your next appointment:   6 week(s)  Provider:   Any APP then 4 month follow up with Dr.Jordan      Signed, Almyra Deforest, [place]  04/25/2022 10:50 AM    [place]

## 2022-04-24 NOTE — Patient Instructions (Addendum)
Medication Instructions:  The current medical regimen is effective;  continue present plan and medications.  *If you need a refill on your cardiac medications before your next appointment, please call your pharmacy*   Lab Work: CBC, BMET in 1 week - no lab appointment needed.   If you have labs (blood work) drawn today and your tests are completely normal, you will receive your results only by: [place] (if you have MyChart) OR A paper copy in the mail If you have any lab test that is abnormal or we need to change your treatment, we will call you to review the results.   Testing/Procedures:  LIMITED ECHOCARDIOGRAM in 4 weeks   Follow-Up: At [facility], you and your health needs are our priority.  As part of our continuing mission to provide you with exceptional heart care, we have created designated Provider Care Teams.  These Care Teams include your primary Cardiologist (physician) and Advanced Practice Providers (APPs -  Physician Assistants and Nurse Practitioners) who all work together to provide you with the care you need, when you need it.  We recommend signing up for the patient portal called "MyChart".  Sign up information is provided on this After Visit Summary.  MyChart is used to connect with patients for Virtual Visits (Telemedicine).  Patients are able to view lab/test results, encounter notes, upcoming appointments, etc.  Non-urgent messages can be sent to your provider as well.   To learn more about what you can do with MyChart, go to [email or web address].    Your next appointment:   6 week(s)  Provider:   Any APP then 4 month follow up with Dr.Jordan

## 2022-04-24 NOTE — Transitions of Care (Post Inpatient/ED Visit) (Signed)
   04/24/2022  Name: Christopher Lara MRN: [id number] DOB: 10-12-1975  Today's TOC FU Call Status: Today's TOC FU Call Status:: Unsuccessful Call (2nd Attempt) Unsuccessful Call (2nd Attempt) Date: 04/24/22  Attempted to reach the patient regarding the most recent Inpatient/ED visit.  Follow Up Plan: Additional outreach attempts will be made to reach the patient to complete the Transitions of Care (Post Inpatient/ED visit) call.     Enzo Montgomery, RN,BSN,CCM [facility] Care Management Care Management Community Coordinator Direct Phone: [phone] Toll Free: [phone] Fax: [phone]

## 2022-04-25 ENCOUNTER — Telehealth: Payer: Self-pay

## 2022-04-25 ENCOUNTER — Encounter: Payer: Self-pay | Admitting: Physician Assistant

## 2022-04-25 NOTE — Transitions of Care (Post Inpatient/ED Visit) (Signed)
   04/25/2022  Name: Christopher Lara MRN: [id number] DOB: 1975/04/21  Today's TOC FU Call Status: Today's TOC FU Call Status:: Unsuccessful Call (3rd Attempt) Unsuccessful Call (3rd Attempt) Date: 04/25/22  Attempted to reach the patient regarding the most recent Inpatient/ED visit.  Follow Up Plan: No further outreach attempts will be made at this time. We have been unable to contact the patient.     Enzo Montgomery, RN,BSN,CCM [facility] Care Management Care Management Community Coordinator Direct Phone: [phone] Toll Free: [phone] Fax: [phone]

## 2022-04-30 DIAGNOSIS — D649 Anemia, unspecified: Secondary | ICD-10-CM | POA: Diagnosis not present

## 2022-04-30 DIAGNOSIS — E871 Hypo-osmolality and hyponatremia: Secondary | ICD-10-CM | POA: Diagnosis not present

## 2022-04-30 LAB — CBC

## 2022-05-01 LAB — BASIC METABOLIC PANEL
BUN/Creatinine Ratio: 8 — ABNORMAL LOW (ref 9–20)
BUN: 10 mg/dL (ref 6–24)
CO2: 23 mmol/L (ref 20–29)
Calcium: 10.6 mg/dL — ABNORMAL HIGH (ref 8.7–10.2)
Chloride: 99 mmol/L (ref 96–106)
Creatinine, Ser: 1.24 mg/dL (ref 0.76–1.27)
Glucose: 85 mg/dL (ref 70–99)
Potassium: 4.6 mmol/L (ref 3.5–5.2)
Sodium: 133 mmol/L — ABNORMAL LOW (ref 134–144)
eGFR: 72 mL/min/{1.73_m2} (ref >59)

## 2022-05-01 LAB — CBC
Hematocrit: 34.8 % — ABNORMAL LOW (ref 37.5–51.0)
Hemoglobin: 11.6 g/dL — ABNORMAL LOW (ref 13.0–17.7)
MCH: 31.4 pg (ref 26.6–33.0)
MCHC: 33.3 g/dL (ref 31.5–35.7)
MCV: 94 fL (ref 79–97)
Platelets: 297 10*3/uL (ref 150–450)
RBC: 3.69 x10E6/uL — ABNORMAL LOW (ref 4.14–5.80)
RDW: 12.9 % (ref 11.6–15.4)
WBC: 4.9 10*3/uL (ref 3.4–10.8)

## 2022-05-14 ENCOUNTER — Ambulatory Visit: Payer: Federal, State, Local not specified - PPO | Admitting: General Practice

## 2022-05-14 NOTE — Progress Notes (Signed)
Patient ID: Christopher Lara, male    DOB: [birth year]  MRN: [id number]  CC: Follow-Up  Subjective: Christopher Lara is a 47 y.o. male who presents for follow-up.   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 Today patient reports feeling back to baseline and no issues of concern. He is no longer having "muscle tightness". His last appointment with Cardiology was on 04/24/2022 and he has a follow-up scheduled with them on next week. He is aware to keep all scheduled appointments with Cardiology. His BMP was last checked on 04/30/2022. No further issues/concerns for discussion today.   Patient Active Problem List   Diagnosis Date Noted   Pericardial effusion 04/19/2022   Physical debility 03/20/2019   Pneumonia due to COVID-19 virus 03/20/2019   Hyponatremia 03/15/2019     Current Outpatient Medications on File Prior to Visit  Medication Sig Dispense Refill   acetaminophen (TYLENOL) 650 MG CR tablet Take 1 tablet (650 mg total) by mouth every 4 (four) hours as needed for pain. (Patient not taking: Reported on 04/24/2022)     [DISCONTINUED] omeprazole (PRILOSEC) 20 MG capsule Take 1 capsule (20 mg total) by mouth daily. 30 minutes prior to meal. (Patient not taking: Reported on 10/01/2014) 30 capsule 0   No current facility-administered medications on file prior to visit.    No Known Allergies  Social History   Socioeconomic History   Marital status: Married    Spouse name: Not on file   Number of children: Not on file   Years of education: Not on file   Highest education level: Not on file  Occupational History   Not on file  Tobacco Use   Smoking status: Never    Passive exposure: Never   Smokeless tobacco: Never  Substance and Sexual Activity   Alcohol use: No   Drug use: No   Sexual activity: Not on file   Other Topics Concern   Not on file  Social History Narrative   Not on file   Social Determinants of Health   Financial Resource Strain: Not on file  Food Insecurity: No Food Insecurity (04/18/2022)   Hunger Vital Sign    Worried About Running Out of Food in the Last Year: Never true    Ran Out of Food in the Last Year: Never true  Transportation Needs: No Transportation Needs (04/18/2022)   PRAPARE - Administrator, Civil Service (Medical): No    Lack of Transportation (Non-Medical): No  Physical Activity: Not on file  Stress: Not on file  Social Connections: Not on file  Intimate Partner Violence: Not At Risk (04/18/2022)   Humiliation, Afraid, Rape, and Kick questionnaire    Fear of Current or Ex-Partner: No    Emotionally Abused: No    Physically Abused: No    Sexually Abused: No    Family History  Problem Relation Age of Onset   Heart failure Father    Heart attack Father    Cancer Brother     Past Surgical History:  Procedure Laterality Date   PERICARDIOCENTESIS N/A 04/19/2022   Procedure: PERICARDIOCENTESIS;  Surgeon: Kathleene Hazel, MD;  Location: [facility] INVASIVE CV LAB;  Service: Cardiovascular;  Laterality: N/A;    ROS: Review of Systems Negative except as stated above  PHYSICAL EXAM: BP 119/83 (BP Location: Left Arm, Patient Position: Sitting, Cuff Size: Normal)  Pulse 64   Temp 98.3 F (36.8 C)   Resp 16   Ht 5' 7.99" (1.727 m)   Wt 173 lb (78.5 kg)   SpO2 97%   BMI 26.31 kg/m   Physical Exam HENT:     Head: Normocephalic and atraumatic.  Eyes:     Extraocular Movements: Extraocular movements intact.     Conjunctiva/sclera: Conjunctivae normal.     Pupils: Pupils are equal, round, and reactive to light.  Cardiovascular:     Rate and Rhythm: Normal rate and regular rhythm.     Pulses: Normal pulses.     Heart sounds: Normal heart sounds.  Pulmonary:     Effort: Pulmonary effort is normal.     Breath sounds: Normal breath  sounds.  Musculoskeletal:     Right shoulder: Normal.     Left shoulder: Normal.     Right upper arm: Normal.     Left upper arm: Normal.     Right elbow: Normal.     Left elbow: Normal.     Right forearm: Normal.     Left forearm: Normal.     Right wrist: Normal.     Left wrist: Normal.     Right hand: Normal.     Left hand: Normal.     Cervical back: Normal range of motion and neck supple.  Neurological:     General: No focal deficit present.     Mental Status: He is alert and oriented to person, place, and time.  Psychiatric:        Mood and Affect: Mood normal.        Behavior: Behavior normal.     ASSESSMENT AND PLAN: 1. Non-traumatic rhabdomyolysis - Routine screening.  - Follow-up with primary provider as scheduled.  - CK   Patient was given the opportunity to ask questions.  Patient verbalized understanding of the plan and was able to repeat key elements of the plan. Patient was given clear instructions to go to Emergency Department or return to medical center if symptoms don't improve, worsen, or new problems develop.The patient verbalized understanding.   Orders Placed This Encounter  Procedures   CK    Return in about 4 weeks (around 06/13/2022) for Follow-Up or next available with Georganna Skeans, MD .  Rema Fendt, NP

## 2022-05-16 ENCOUNTER — Encounter: Payer: Self-pay | Admitting: Family

## 2022-05-16 ENCOUNTER — Ambulatory Visit (INDEPENDENT_AMBULATORY_CARE_PROVIDER_SITE_OTHER): Payer: Federal, State, Local not specified - PPO | Admitting: Family

## 2022-05-16 VITALS — BP 119/83 | HR 64 | Temp 98.3°F | Resp 16 | Ht 67.99 in | Wt 173.0 lb

## 2022-05-16 DIAGNOSIS — M6282 Rhabdomyolysis: Secondary | ICD-10-CM

## 2022-05-16 NOTE — Progress Notes (Signed)
Pt presents for follow-up -ED visit 04/18/22 -pt states he is feeling better

## 2022-05-17 LAB — CK: Total CK: 1960 U/L (ref 49–439)

## 2022-05-23 ENCOUNTER — Ambulatory Visit (HOSPITAL_COMMUNITY): Payer: Federal, State, Local not specified - PPO | Attending: Cardiology

## 2022-05-23 ENCOUNTER — Encounter (HOSPITAL_COMMUNITY): Payer: Self-pay

## 2022-05-23 DIAGNOSIS — I3139 Other pericardial effusion (noninflammatory): Secondary | ICD-10-CM | POA: Diagnosis not present

## 2022-05-23 LAB — ECHOCARDIOGRAM LIMITED: Area-P 1/2: 3.85 cm2

## 2022-05-23 NOTE — Progress Notes (Signed)
Dr. Ali Lowe (DOD) reviewed images and assesed patient. Dr. Martinique notified per Dr. Ali Lowe. Patient discharged per Dr. Ali Lowe.

## 2022-05-28 ENCOUNTER — Telehealth: Payer: Self-pay | Admitting: Cardiology

## 2022-05-28 DIAGNOSIS — I3139 Other pericardial effusion (noninflammatory): Secondary | ICD-10-CM

## 2022-05-28 NOTE — Telephone Encounter (Signed)
Patient dropped off FMLA forms. Forms put in [place] box for review at upcoming appt pt has on 4/9.

## 2022-06-05 ENCOUNTER — Ambulatory Visit: Payer: Federal, State, Local not specified - PPO | Attending: Nurse Practitioner | Admitting: Nurse Practitioner

## 2022-06-05 ENCOUNTER — Encounter: Payer: Self-pay | Admitting: Nurse Practitioner

## 2022-06-05 VITALS — BP 128/74 | HR 72 | Ht 67.0 in | Wt 176.8 lb

## 2022-06-05 DIAGNOSIS — D649 Anemia, unspecified: Secondary | ICD-10-CM | POA: Diagnosis not present

## 2022-06-05 DIAGNOSIS — Z87898 Personal history of other specified conditions: Secondary | ICD-10-CM | POA: Diagnosis not present

## 2022-06-05 DIAGNOSIS — I3139 Other pericardial effusion (noninflammatory): Secondary | ICD-10-CM

## 2022-06-05 DIAGNOSIS — E871 Hypo-osmolality and hyponatremia: Secondary | ICD-10-CM | POA: Diagnosis not present

## 2022-06-05 NOTE — Patient Instructions (Signed)
Medication Instructions:  Your physician recommends that you continue on your current medications as directed. Please refer to the Current Medication list given to you today.  *If you need a refill on your cardiac medications before your next appointment, please call your pharmacy*   Lab Work: Your physician recommends that you complete lab work today. CK, CBC, BMET   If you have labs (blood work) drawn today and your tests are completely normal, you will receive your results only by: MyChart Message (if you have MyChart) OR A paper copy in the mail If you have any lab test that is abnormal or we need to change your treatment, we will call you to review the results.   Testing/Procedures: NONE ordered at this time of appointment     Follow-Up: At [facility], you and your health needs are our priority.  As part of our continuing mission to provide you with exceptional heart care, we have created designated Provider Care Teams.  These Care Teams include your primary Cardiologist (physician) and Advanced Practice Providers (APPs -  Physician Assistants and Nurse Practitioners) who all work together to provide you with the care you need, when you need it.  We recommend signing up for the patient portal called "MyChart".  Sign up information is provided on this After Visit Summary.  MyChart is used to connect with patients for Virtual Visits (Telemedicine).  Patients are able to view lab/test results, encounter notes, upcoming appointments, etc.  Non-urgent messages can be sent to your provider as well.   To learn more about what you can do with MyChart, go to [email or web address].    Your next appointment:    Keep follow up  Provider:   Peter [place], MD     Other Instructions

## 2022-06-05 NOTE — Progress Notes (Unsigned)
Office Visit    Patient Name: Christopher CollumKuniaki E Paschen Date of Encounter: 06/05/2022  Primary Care Provider:  Georganna SkeansWilson, Amelia, MD Primary Cardiologist:  Peter SwazilandJordan, MD  Chief Complaint    47 year old male with a history of recurrent pericardial effusion, syncope, hyponatremia, and anemia who presents for follow-up related to pericardial effusion.  Past Medical History    Past Medical History:  Diagnosis Date   Kidney stone    Kidney stone    Kidney stones    Kidney stones    Past Surgical History:  Procedure Laterality Date   PERICARDIOCENTESIS N/A 04/19/2022   Procedure: PERICARDIOCENTESIS;  Surgeon: Kathleene HazelMcAlhany, Christopher D, MD;  [facility];  Service: Cardiovascular;  Laterality: N/A;    Allergies  No Known Allergies   Labs/Other Studies Reviewed    The following studies were reviewed today: Limited echo 04/19/2022:  1. Trivial pericardial effusion post pericardiocentesis.   2. Left ventricular ejection fraction, by estimation, is 65 to 70%. The  left ventricle has normal function.   3. The inferior vena cava is normal in size with greater than 50%  respiratory variability, suggesting right atrial pressure of 3 mmHg.      Cath 04/19/2022: Large pericardial effusion Successful pericardiocentesis with removal of 1000 cc of straw colored fluid. No drain left in place.    Recommendations: Follow up echo tomorrow.    Limited echo 04/2022:  IMPRESSIONS    1. Left ventricular ejection fraction, by estimation, is 60 to 65%. The  left ventricle has normal function. The left ventricle has no regional  wall motion abnormalities.   2. Large pericardial effusion. The pericardial effusion is  circumferential. There is no evidence of cardiac tamponade.   3. The aortic valve is tricuspid. Aortic valve regurgitation is not  visualized. No aortic stenosis is present.   4. The inferior vena cava is normal in size with greater than 50%  respiratory variability,  suggesting right atrial pressure of 3 mmHg.   Comparison(s): Changes from prior study are noted. Recurrence of  pericardial effusion, last echo with trivial effusion post  pericardiocentesis.   Conclusion(s)/Recommendation(s): Large pericardial effusion, maximum size  2.68 cm adjacent to RA. [facility]. Lynnette Caffeyhukkani notified by  sonographer, patient clinically stable. No evidence of tamponade by echo.   Recent Labs: 04/18/2022: ALT 45; B Natriuretic Peptide 36.4 04/20/2022: Magnesium 2.7 04/30/2022: BUN 10; Creatinine, Ser 1.24; Hemoglobin 11.6; Platelets 297; Potassium 4.6; Sodium 133  Recent Lipid Panel No results found for: "CHOL", "TRIG", "HDL", "CHOLHDL", "VLDL", "LDLCALC", "LDLDIRECT"  History of Present Illness    47 year old male with the above past medical history including recurrent pericardial effusion, syncope, hyponatremia, and anemia.  He has a history of severe hyponatremia in the setting of COVID-pneumonia in January 2021.  He was seen in [facility] ED in December 2022 for syncope that occurred following a hot shower.  He developed tunnel vision and a sensation that things were going dark before he passed out and hit his face.  He was hospitalized in February 2024 with severe hyponatremia, altered mental status following recurrent COVID-19 infection.  He was hypertensive upon arrival.  He was found to have a large pericardial effusion.  Cardiology was consulted.  EKG showed sinus rhythm with nonspecific T wave changes, prolonged QTc of 523 ms.  Troponin was negative.  Sodium was markedly low at 105.  CK was significantly elevated consistent with rhabdomyolysis.  Echocardiogram showed EF 70 to 75%, G2 DD, large pericardial effusion,  no evidence of cardiac tamponade.  Sodium improved to 120.  He underwent pericardiocentesis with removal of 1 L of fluid on 04/19/2022.  Initial fluid analysis was consistent with transudative effusion. Limited echo on 04/19/2022 demonstrated EF 65 to  70%, trivial pericardial effusion.  He was last seen in the office on 04/24/2022 and was stable overall from a cardiac standpoint.  He denied any chest pain or shortness of breath.  Repeat limited echo on 05/23/2022 showed EF 60 to 65%, normal LV function, large pericardial effusion, no evidence of tamponade.  It was noted that should he have recurrent symptoms, he would likely require pericardial window given recurrent effusion.  He presents today for follow-up accompanied by his wife.  Since his last visit he has been stable from a cardiac standpoint.  He denies chest pain, shortness of breath, palpitations, dizziness, presyncope, or syncope. He is concerned that his repeat echo showed recurrent pericardial effusion, but overall, he reports feeling well.    Home Medications    Current Outpatient Medications  Medication Sig Dispense Refill   acetaminophen (TYLENOL) 650 MG CR tablet Take 1 tablet (650 mg total) by mouth every 4 (four) hours as needed for pain. (Patient not taking: Reported on 04/24/2022)     No current facility-administered medications for this visit.     Review of Systems    He denies chest pain, palpitations, dyspnea, pnd, orthopnea, n, v, dizziness, syncope, edema, weight gain, or early satiety. All other systems reviewed and are otherwise negative except as noted above.   Physical Exam    VS:  BP 128/74   Pulse 72   Ht 5\' 7"  (1.702 m)   Wt 176 lb 12.8 oz (80.2 kg)   SpO2 99%   BMI 27.69 kg/m   GEN: Well nourished, well developed, in no acute distress. HEENT: normal. Neck: Supple, no JVD, carotid bruits, or masses. Cardiac: RRR, no murmurs, rubs, or gallops. No clubbing, cyanosis, edema.  Radials/DP/PT 2+ and equal bilaterally.  Respiratory:  Respirations regular and unlabored, clear to auscultation bilaterally. GI: Soft, nontender, nondistended, BS + x 4. MS: no deformity or atrophy. Skin: warm and dry, no rash. Neuro:  Strength and sensation are intact. Psych:  Normal affect.  Accessory Clinical Findings    ECG personally reviewed by me today - No EKG in office today.   Lab Results  Component Value Date   WBC 4.9 04/30/2022   HGB 11.6 (L) 04/30/2022   HCT 34.8 (L) 04/30/2022   MCV 94 04/30/2022   PLT 297 04/30/2022   Lab Results  Component Value Date   CREATININE 1.24 04/30/2022   BUN 10 04/30/2022   NA 133 (L) 04/30/2022   K 4.6 04/30/2022   CL 99 04/30/2022   CO2 23 04/30/2022   Lab Results  Component Value Date   ALT 45 (H) 04/18/2022   AST 217 (H) 04/18/2022   ALKPHOS 42 04/18/2022   BILITOT 1.0 04/18/2022   No results found for: "CHOL", "HDL", "LDLCALC", "LDLDIRECT", "TRIG", "CHOLHDL"  No results found for: "HGBA1C"  Assessment & Plan   1. Pericardial effusion: Occurred following COVID-19 infection. S/p pericardiocentesis in 03/2022, now with recurrent large pericardial effusion on repeat limited echo on 05/23/2022. No evidence of tamponade. Discussed with Dr. Swaziland, will refer to CT surgery for consideration of pericardial window given recurrent pericardial effusion. Pt is agreeable. Will update CK. Discussed ED precautions.   2. History of syncope: No recurrence.   3. Hyponatremia: Improved. Will repeat BMET.  4. Anemia: Hemoglobin was stable at 13.2 in 03/2022. Will repeat CBC.   5. Disposition: Referral to CT surgery. Follow-up as scheduled with Dr. Swaziland in 07/2022.      Joylene Grapes, NP 06/05/2022, 3:56 PM

## 2022-06-06 ENCOUNTER — Encounter: Payer: Self-pay | Admitting: Nurse Practitioner

## 2022-06-06 LAB — CBC
Hematocrit: 38.2 % (ref 37.5–51.0)
Hemoglobin: 13 g/dL (ref 13.0–17.7)
MCH: 32.9 pg (ref 26.6–33.0)
MCHC: 34 g/dL (ref 31.5–35.7)
MCV: 97 fL (ref 79–97)
Platelets: 237 10*3/uL (ref 150–450)
RBC: 3.95 x10E6/uL — ABNORMAL LOW (ref 4.14–5.80)
RDW: 14.1 % (ref 11.6–15.4)
WBC: 4.2 10*3/uL (ref 3.4–10.8)

## 2022-06-06 LAB — BASIC METABOLIC PANEL
BUN/Creatinine Ratio: 8 — ABNORMAL LOW (ref 9–20)
BUN: 11 mg/dL (ref 6–24)
CO2: 23 mmol/L (ref 20–29)
Calcium: 11.2 mg/dL — ABNORMAL HIGH (ref 8.7–10.2)
Chloride: 106 mmol/L (ref 96–106)
Creatinine, Ser: 1.37 mg/dL — ABNORMAL HIGH (ref 0.76–1.27)
Glucose: 81 mg/dL (ref 70–99)
Potassium: 4.4 mmol/L (ref 3.5–5.2)
Sodium: 142 mmol/L (ref 134–144)
eGFR: 64 mL/min/{1.73_m2} (ref >59)

## 2022-06-06 LAB — CK: Total CK: 1458 U/L (ref 49–439)

## 2022-06-06 NOTE — Telephone Encounter (Signed)
Spoke with pt. Pt was notified of results. Pt is aware that an urgent referral was sent to CT Surgery ok per Dr. Swaziland. Pt will continue current medication and f/u as planned.

## 2022-06-07 ENCOUNTER — Inpatient Hospital Stay (HOSPITAL_COMMUNITY)
Admission: EM | Admit: 2022-06-07 | Discharge: 2022-06-15 | DRG: 271 | Disposition: A | Discharge status: Home / Self Care | Payer: Federal, State, Local not specified - PPO | Attending: Internal Medicine | Admitting: Internal Medicine

## 2022-06-07 ENCOUNTER — Encounter (HOSPITAL_COMMUNITY): Payer: Self-pay

## 2022-06-07 ENCOUNTER — Other Ambulatory Visit: Payer: Self-pay

## 2022-06-07 ENCOUNTER — Emergency Department (HOSPITAL_COMMUNITY): Payer: Federal, State, Local not specified - PPO

## 2022-06-07 ENCOUNTER — Inpatient Hospital Stay (HOSPITAL_COMMUNITY): Payer: Federal, State, Local not specified - PPO

## 2022-06-07 DIAGNOSIS — R197 Diarrhea, unspecified: Secondary | ICD-10-CM | POA: Diagnosis not present

## 2022-06-07 DIAGNOSIS — N200 Calculus of kidney: Secondary | ICD-10-CM | POA: Diagnosis not present

## 2022-06-07 DIAGNOSIS — D72829 Elevated white blood cell count, unspecified: Secondary | ICD-10-CM | POA: Diagnosis not present

## 2022-06-07 DIAGNOSIS — R0602 Shortness of breath: Secondary | ICD-10-CM | POA: Diagnosis not present

## 2022-06-07 DIAGNOSIS — Z8616 Personal history of COVID-19: Secondary | ICD-10-CM

## 2022-06-07 DIAGNOSIS — E869 Volume depletion, unspecified: Secondary | ICD-10-CM | POA: Diagnosis present

## 2022-06-07 DIAGNOSIS — J90 Pleural effusion, not elsewhere classified: Secondary | ICD-10-CM | POA: Diagnosis not present

## 2022-06-07 DIAGNOSIS — R7982 Elevated C-reactive protein (CRP): Secondary | ICD-10-CM | POA: Diagnosis present

## 2022-06-07 DIAGNOSIS — Z1152 Encounter for screening for COVID-19: Secondary | ICD-10-CM | POA: Diagnosis not present

## 2022-06-07 DIAGNOSIS — I3139 Other pericardial effusion (noninflammatory): Secondary | ICD-10-CM | POA: Diagnosis not present

## 2022-06-07 DIAGNOSIS — R001 Bradycardia, unspecified: Secondary | ICD-10-CM | POA: Diagnosis present

## 2022-06-07 DIAGNOSIS — R0789 Other chest pain: Secondary | ICD-10-CM | POA: Diagnosis not present

## 2022-06-07 DIAGNOSIS — J9811 Atelectasis: Secondary | ICD-10-CM | POA: Diagnosis not present

## 2022-06-07 DIAGNOSIS — R748 Abnormal levels of other serum enzymes: Secondary | ICD-10-CM | POA: Diagnosis present

## 2022-06-07 DIAGNOSIS — Z8701 Personal history of pneumonia (recurrent): Secondary | ICD-10-CM | POA: Diagnosis not present

## 2022-06-07 DIAGNOSIS — R700 Elevated erythrocyte sedimentation rate: Secondary | ICD-10-CM | POA: Diagnosis not present

## 2022-06-07 DIAGNOSIS — I119 Hypertensive heart disease without heart failure: Secondary | ICD-10-CM | POA: Diagnosis not present

## 2022-06-07 DIAGNOSIS — Z8249 Family history of ischemic heart disease and other diseases of the circulatory system: Secondary | ICD-10-CM | POA: Diagnosis not present

## 2022-06-07 DIAGNOSIS — R079 Chest pain, unspecified: Secondary | ICD-10-CM | POA: Diagnosis not present

## 2022-06-07 DIAGNOSIS — T504X5A Adverse effect of drugs affecting uric acid metabolism, initial encounter: Secondary | ICD-10-CM | POA: Diagnosis not present

## 2022-06-07 DIAGNOSIS — Z9889 Other specified postprocedural states: Secondary | ICD-10-CM | POA: Diagnosis present

## 2022-06-07 DIAGNOSIS — N179 Acute kidney failure, unspecified: Secondary | ICD-10-CM

## 2022-06-07 LAB — TROPONIN I (HIGH SENSITIVITY)
Troponin I (High Sensitivity): 3 ng/L (ref <18)
Troponin I (High Sensitivity): 3 ng/L (ref <18)

## 2022-06-07 LAB — CBC
HCT: 36.4 % — ABNORMAL LOW (ref 39.0–52.0)
Hemoglobin: 12.4 g/dL — ABNORMAL LOW (ref 13.0–17.0)
MCH: 33.1 pg (ref 26.0–34.0)
MCHC: 34.1 g/dL (ref 30.0–36.0)
MCV: 97.1 fL (ref 80.0–100.0)
Platelets: 226 10*3/uL (ref 150–400)
RBC: 3.75 MIL/uL — ABNORMAL LOW (ref 4.22–5.81)
RDW: 13.4 % (ref 11.5–15.5)
WBC: 4.1 10*3/uL (ref 4.0–10.5)
nRBC: 0 % (ref 0.0–0.2)

## 2022-06-07 LAB — BASIC METABOLIC PANEL
Anion gap: 7 (ref 5–15)
BUN: 10 mg/dL (ref 6–20)
CO2: 27 mmol/L (ref 22–32)
Calcium: 10.7 mg/dL — ABNORMAL HIGH (ref 8.9–10.3)
Chloride: 104 mmol/L (ref 98–111)
Creatinine, Ser: 1.43 mg/dL — ABNORMAL HIGH (ref 0.61–1.24)
GFR, Estimated: >60 mL/min (ref >60)
Glucose, Bld: 84 mg/dL (ref 70–99)
Potassium: 4 mmol/L (ref 3.5–5.1)
Sodium: 138 mmol/L (ref 135–145)

## 2022-06-07 LAB — URINALYSIS, COMPLETE (UACMP) WITH MICROSCOPIC
Bacteria, UA: NONE SEEN
Bilirubin Urine: NEGATIVE
Glucose, UA: NEGATIVE mg/dL
Hgb urine dipstick: NEGATIVE
Ketones, ur: NEGATIVE mg/dL
Leukocytes,Ua: NEGATIVE
Nitrite: NEGATIVE
Protein, ur: NEGATIVE mg/dL
Specific Gravity, Urine: 1.011 (ref 1.005–1.030)
pH: 7 (ref 5.0–8.0)

## 2022-06-07 LAB — BRAIN NATRIURETIC PEPTIDE: B Natriuretic Peptide: 34.3 pg/mL (ref 0.0–100.0)

## 2022-06-07 LAB — CK: Total CK: 1764 U/L — ABNORMAL HIGH (ref 49–397)

## 2022-06-07 MED ORDER — ONDANSETRON HCL 4 MG PO TABS: 4.0000 mg | ORAL_TABLET | Freq: Four times a day (QID) | ORAL | Status: DC | PRN

## 2022-06-07 MED ORDER — ACETAMINOPHEN 650 MG RE SUPP: 650.0000 mg | Freq: Four times a day (QID) | RECTAL | Status: DC | PRN

## 2022-06-07 MED ORDER — ACETAMINOPHEN 325 MG PO TABS
650.0000 mg | ORAL_TABLET | Freq: Four times a day (QID) | ORAL | Status: DC | PRN
  Administered 2022-06-13: 650 mg via ORAL
  Filled 2022-06-07: qty 2

## 2022-06-07 MED ORDER — SODIUM CHLORIDE 0.9 % IV SOLN: INTRAVENOUS | Status: DC

## 2022-06-07 MED ORDER — ONDANSETRON HCL 4 MG/2ML IJ SOLN: 4.0000 mg | Freq: Four times a day (QID) | INTRAMUSCULAR | Status: DC | PRN

## 2022-06-07 NOTE — ED Provider Triage Note (Signed)
Emergency Medicine Provider Triage Evaluation Note  Christopher Lara , a 47 y.o. male  was evaluated in triage.  Pt complains of cp. L sided chest pain started today.  Had covid earlier this year with heart complication including pericardial effusion.  No significant hx of cardiac disease.  No fever, sob, cough, n/v  Review of Systems  Positive: As above Negative: As above  Physical Exam  BP (!) 146/102 (BP Location: Right Arm)   Pulse 65   Temp 97.7 F (36.5 C)   Resp 18   SpO2 100%  Gen:   Awake, no distress   Resp:  Normal effort  MSK:   Moves extremities without difficulty  Other:    Medical Decision Making  Medically screening exam initiated at 1:57 PM.  Appropriate orders placed.  [name] was informed that the remainder of the evaluation will be completed by another provider, this initial triage assessment does not replace that evaluation, and the importance of remaining in the ED until their evaluation is complete.     Fayrene Helper, PA-C 06/07/22 [phone]

## 2022-06-07 NOTE — ED Notes (Signed)
Patient transported to CT 

## 2022-06-07 NOTE — Assessment & Plan Note (Addendum)
Mild and unclear etiology. Check PTH, PTHrp Check CT C/A/P Though wondering if this is more of a chronic metabolic issue given h/o multiple kidney stones, calcium ox crystals in urine, etc.

## 2022-06-07 NOTE — ED Notes (Signed)
Transport taking pt upstairs, pt in no acute distress, vitals stable upon leaving.

## 2022-06-07 NOTE — ED Notes (Signed)
Patient transported back to room. 

## 2022-06-07 NOTE — ED Notes (Signed)
ED TO INPATIENT HANDOFF REPORT  ED Nurse Name and Phone #: Jodie Echevaria [phone]  S Name/Age/Gender Christopher Lara 47 y.o. male Room/Bed: 041C/041C  Code Status   Code Status: Full Code  Home/SNF/Other Home Patient oriented to: self, place, time, and situation Is this baseline? Yes   Triage Complete: Triage complete  Chief Complaint Pericardial effusion [I31.39]  Triage Note Pt c/o L upper rib/chest pain  that started this am; tightness, tingling; denies sob, denies N/V; denies hx of same, nothing makes better or worse; hx of pericardiocentesis    Allergies No Known Allergies  Level of Care/Admitting Diagnosis ED Disposition     ED Disposition  Admit   Condition  --   Comment  Hospital Area: [facility] [[id number]]  Level of Care: Telemetry Cardiac [[id number]]  May admit patient to [facility] or [facility] if equivalent level of care is available:: No  Covid Evaluation: Asymptomatic - no recent exposure (last 10 days) testing not required  Diagnosis: Pericardial effusion [203045]  Admitting Physician: Hillary Bow [[id number]]  Attending Physician: Hillary Bow [phone]  Certification:: I certify this patient is being admitted for an inpatient-only procedure  Estimated Length of Stay: 2          B Medical/Surgery History Past Medical History:  Diagnosis Date   Kidney stone    Kidney stone    Kidney stones    Kidney stones    Past Surgical History:  Procedure Laterality Date   PERICARDIOCENTESIS N/A 04/19/2022   Procedure: PERICARDIOCENTESIS;  Surgeon: Kathleene Hazel, MD;  Location: [facility] INVASIVE CV LAB;  Service: Cardiovascular;  Laterality: N/A;     A IV Location/Drains/Wounds Patient Lines/Drains/Airways Status     Active Line/Drains/Airways     Name Placement date Placement time Site Days   Peripheral IV 06/07/22 20 G 1" Anterior;Proximal;Right Forearm 06/07/22  2046  Forearm  less than 1   Wound / Incision (Open or Dehisced) 04/19/22  Puncture Sternum Left;Lower 04/19/22  2248  Sternum  49            Intake/Output Last 24 hours No intake or output data in the 24 hours ending 06/07/22 2225  Labs/Imaging Results for orders placed or performed during the hospital encounter of 06/07/22 (from the past 48 hour(s))  Basic metabolic panel     Status: Abnormal   Collection Time: 06/07/22  2:03 PM  Result Value Ref Range   Sodium 138 135 - 145 mmol/L   Potassium 4.0 3.5 - 5.1 mmol/L   Chloride 104 98 - 111 mmol/L   CO2 27 22 - 32 mmol/L   Glucose, Bld 84 70 - 99 mg/dL    Comment: Glucose reference range applies only to samples taken after fasting for at least 8 hours.   BUN 10 6 - 20 mg/dL   Creatinine, Ser 7.85 (H) 0.61 - 1.24 mg/dL   Calcium 88.5 (H) 8.9 - 10.3 mg/dL   GFR, Estimated >02 >77 mL/min    Comment: (NOTE) Calculated using the CKD-EPI Creatinine Equation (2021)    Anion gap 7 5 - 15    Comment: Performed at [facility], [address] [place], [place] [address]  CBC     Status: Abnormal   Collection Time: 06/07/22  2:03 PM  Result Value Ref Range   WBC 4.1 4.0 - 10.5 K/uL   RBC 3.75 (L) 4.22 - 5.81 MIL/uL   Hemoglobin 12.4 (L) 13.0 - 17.0 g/dL   HCT 86.7 (  L) 39.0 - 52.0 %   MCV 97.1 80.0 - 100.0 fL   MCH 33.1 26.0 - 34.0 pg   MCHC 34.1 30.0 - 36.0 g/dL   RDW 09.813.4 11.911.5 - 14.715.5 %   Platelets 226 150 - 400 K/uL   nRBC 0.0 0.0 - 0.2 %    Comment: Performed at [facility], [address] [place], [place] [address]  Troponin I (High Sensitivity)     Status: None   Collection Time: 06/07/22  2:03 PM  Result Value Ref Range   Troponin I (High Sensitivity) 3 <18 ng/L    Comment: (NOTE) Elevated high sensitivity troponin I (hsTnI) values and significant  changes across serial measurements may suggest ACS but many other  chronic and acute conditions are known to elevate hsTnI results.  Refer to the "Links" section for chest pain algorithms and additional  guidance. Performed at [facility], [address] [place], [place] [address]   Brain natriuretic peptide     Status: None   Collection Time: 06/07/22  2:29 PM  Result Value Ref Range   B Natriuretic Peptide 34.3 0.0 - 100.0 pg/mL    Comment: Performed at [facility], [address] [place], [place] [address]  Troponin I (High Sensitivity)     Status: None   Collection Time: 06/07/22  5:22 PM  Result Value Ref Range   Troponin I (High Sensitivity) 3 <18 ng/L    Comment: (NOTE) Elevated high sensitivity troponin I (hsTnI) values and significant  changes across serial measurements may suggest ACS but many other  chronic and acute conditions are known to elevate hsTnI results.  Refer to the "Links" section for chest pain algorithms and additional  guidance. Performed at [facility], [address] [place], [place] [address]   CK     Status: Abnormal   Collection Time: 06/07/22  9:26 PM  Result Value Ref Range   Total CK 1,764 (H) 49 - 397 U/L    Comment: Performed at [facility], [address] [place], [place] [address]  Urinalysis, Complete w Microscopic -Urine, Clean Catch     Status: Abnormal   Collection Time: 06/07/22  9:29 PM  Result Value Ref Range   Color, Urine STRAW (A) YELLOW   APPearance CLEAR CLEAR   Specific Gravity, Urine 1.011 1.005 - 1.030   pH 7.0 5.0 - 8.0   Glucose, UA NEGATIVE NEGATIVE mg/dL   Hgb urine dipstick NEGATIVE NEGATIVE   Bilirubin Urine NEGATIVE NEGATIVE   Ketones, ur NEGATIVE NEGATIVE mg/dL   Protein, ur NEGATIVE NEGATIVE mg/dL   Nitrite NEGATIVE NEGATIVE   Leukocytes,Ua NEGATIVE NEGATIVE   RBC / HPF 0-5 0 - 5 RBC/hpf   WBC, UA 0-5 0 - 5 WBC/hpf   Bacteria, UA NONE SEEN NONE SEEN   Squamous Epithelial / HPF 0-5 0 - 5 /HPF   Mucus PRESENT     Comment: Performed at [facility], [address] [place], [place] [address]   DG Chest 1 View  Result Date: 06/07/2022 CLINICAL DATA:  Chest pain EXAM: CHEST  1 VIEW COMPARISON:   04/18/2022 FINDINGS: Stable cardiomegaly without acute airspace process, pneumonia, edema, effusion or pneumothorax. Trachea midline. No acute osseous finding. IMPRESSION: Cardiomegaly without acute process. Electronically Signed   By: Judie PetitM.  Shick M.D.   On: 06/07/2022 14:35    Pending Labs [employer] Labs (From admission, onward)     Start  Ordered   06/08/22 0500  Sedimentation rate  Tomorrow morning,   R        06/07/22 2118   06/08/22 0500  C-reactive protein  Tomorrow morning,   R        06/07/22 2118   06/08/22 0500  CBC  Tomorrow morning,   R        06/07/22 2124   06/08/22 0500  Comprehensive metabolic panel  Tomorrow morning,   R        06/07/22 2124   06/07/22 2124  Parathyroid hormone, intact (no Ca)  Once,   R        06/07/22 2124   06/07/22 2124  PTH-related peptide  Once,   R        06/07/22 2124            Vitals/Pain Today's Vitals   06/07/22 1753 06/07/22 2000 06/07/22 2200 06/07/22 2224  BP: (!) 132/102 (!) 137/103 (!) 130/98   Pulse: (!) 59 61 62 63  Resp: 17 19 15 13   Temp: 97.8 F (36.6 C)   97.9 F (36.6 C)  TempSrc:    Oral  SpO2: 100% 99% 99% 100%  PainSc:        Isolation Precautions No active isolations  Medications Medications  acetaminophen (TYLENOL) tablet 650 mg (has no administration in time range)    Or  acetaminophen (TYLENOL) suppository 650 mg (has no administration in time range)  ondansetron (ZOFRAN) tablet 4 mg (has no administration in time range)    Or  ondansetron (ZOFRAN) injection 4 mg (has no administration in time range)    Mobility walks     Focused Assessments Cardiac Assessment Handoff:  Cardiac Rhythm: Normal sinus rhythm Lab Results  Component Value Date   CKTOTAL 1,764 (H) 06/07/2022   TROPONINI <0.03 09/21/2014   Lab Results  Component Value Date   DDIMER 2.58 (H) 03/15/2019   Does the Patient currently have chest pain? No   , Neuro Assessment Handoff:  Swallow screen pass? Yes  Cardiac  Rhythm: Normal sinus rhythm       Neuro Assessment:   Neuro Checks:      Has TPA been given? No If patient is a Neuro Trauma and patient is going to OR before floor call report to 4N Charge nurse: [phone] or [phone]  , Pulmonary Assessment Handoff:  Lung sounds: Bilateral Breath Sounds: Clear O2 Device: Room Air      R Recommendations: See Admitting Provider Note  Report given to:   Additional Notes: N/A

## 2022-06-07 NOTE — Assessment & Plan Note (Addendum)
Mild AKI, possibly due to effusion? Also looks like he's got mild chronic rhabdo? Check UA CT C/A/P given he's also got mild hypercalcemia of unclear etiology Check CPK Strict intake and output

## 2022-06-07 NOTE — Assessment & Plan Note (Addendum)
Likely to need pericardiocentesis / pericardial window. NPO per cards Management per cardiology / CVTS. Sounds like he needs pericardial window Tele monitor CT C/A/P -> mild hypercalcemia + recurrent effusion of unclear etiology + FHx of cancer in brother. 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

## 2022-06-07 NOTE — ED Notes (Signed)
Pt report received from previous nurse. Pt A&O x4, vitals stable, denies needs/complaints. Call bell in reach. Family at bedside. No acute distress noted.

## 2022-06-07 NOTE — H&P (Signed)
History and Physical    Patient: Christopher Lara DOB: [birth year] DOA: 06/07/2022 DOS: the patient was seen and examined on 06/07/2022 PCP: Georganna Skeans, MD  Patient coming from: Home  Chief Complaint:  Chief Complaint  Patient presents with   Shortness of Breath   HPI: Christopher Lara is a 47 y.o. 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  Recurrent hyponatremia, SIADH, rhabdo, with recurrent COVID infection in Jan 2024.  Jan 2024 also had large pericardial effusion and rhabdo.  Pericardiocentesis performed.  Echocardiogram showed EF 70 to 75%, large pericardial effusion, no evidence of cardiac tamponade. Sodium improved to 120. He underwent pericardiocentesis with removal of 1 L of fluid on 04/19/2022. Initial fluid analysis was consistent with transudative effusion. Limited echo on 04/19/2022 demonstrated EF 65 to 70%, trivial pericardial effusion. He was last seen in the office on 04/24/2022 and was stable overall from a cardiac standpoint. He denied any chest pain or shortness of breath. Repeat limited echo on 05/23/2022 showed EF 60 to 65%, normal LV function, large pericardial effusion, no evidence of tamponade. It was noted that should he have recurrent symptoms, he would likely require pericardial window given recurrent effusion.   Presents to the ED today hypertensive and bradycardic with waxing and waning chest pain and a recent echocardiogram as noted above with large pericardial effusion. He and his wife were concerned and they came into the emergency department. He is hemodynamically stable and in no respiratory distress.  Bedside echo showed large effusion, normal RV size and function.  Review of Systems: As mentioned in the history of present illness. All other systems reviewed and are negative. Past Medical History:  Diagnosis Date   Kidney stone    Kidney stone    Kidney stones    Kidney stones    Past Surgical  History:  Procedure Laterality Date   PERICARDIOCENTESIS N/A 04/19/2022   Procedure: PERICARDIOCENTESIS;  Surgeon: Kathleene Hazel, MD;  Location: [facility] INVASIVE CV LAB;  Service: Cardiovascular;  Laterality: N/A;   Social History:  reports that he has never smoked. He has never been exposed to tobacco smoke. He has never used smokeless tobacco. He reports that he does not drink alcohol and does not use drugs.  No Known Allergies  Family History  Problem Relation Age of Onset   Heart failure Father    Heart attack Father    Cancer Brother     Prior to Admission medications   Medication Sig Start Date End Date Taking? Authorizing Provider  acetaminophen (TYLENOL) 650 MG CR tablet Take 1 tablet (650 mg total) by mouth every 4 (four) hours as needed for pain. Patient not taking: Reported on 04/24/2022 04/21/22   Rolly Salter, MD  omeprazole (PRILOSEC) 20 MG capsule Take 1 capsule (20 mg total) by mouth daily. 30 minutes prior to meal. Patient not taking: Reported on 10/01/2014 09/21/14 11/06/14  Ladona Mow, PA-C    Physical Exam: Vitals:   06/07/22 1336 06/07/22 1753 06/07/22 2000  BP: (!) 146/102 (!) 132/102 (!) 137/103  Pulse: 65 (!) 59 61  Resp: 18 17 19   Temp: 97.7 F (36.5 C) 97.8 F (36.6 C)   SpO2: 100% 100% 99%   Constitutional: NAD, calm, comfortable Respiratory: clear to auscultation bilaterally, no wheezing, no crackles. Normal respiratory effort. No accessory muscle use.  Cardiovascular: Distant heart sounds.  Abdomen: no tenderness, no masses palpated. No hepatosplenomegaly. Bowel sounds positive.  Neurologic: CN 2-12  grossly intact. Sensation intact, DTR normal. Strength 5/5 in all 4.  Psychiatric: Normal judgment and insight. Alert and oriented x 3. Normal mood.   Data Reviewed:       Latest Ref Rng & Units 06/07/2022    2:03 PM 06/05/2022    4:19 PM 04/30/2022   11:20 AM  CBC  WBC 4.0 - 10.5 K/uL 4.1  4.2  4.9   Hemoglobin 13.0 - 17.0 g/dL 29.5  62.1  30.8    Hematocrit 39.0 - 52.0 % 36.4  38.2  34.8   Platelets 150 - 400 K/uL 226  237  297       Latest Ref Rng & Units 06/07/2022    2:03 PM 06/05/2022    4:19 PM 04/30/2022   11:20 AM  CMP  Glucose 70 - 99 mg/dL 84  81  85   BUN 6 - 20 mg/dL 10  11  10    Creatinine 0.61 - 1.24 mg/dL 6.57  8.46  9.62   Sodium 135 - 145 mmol/L 138  142  133   Potassium 3.5 - 5.1 mmol/L 4.0  4.4  4.6   Chloride 98 - 111 mmol/L 104  106  99   CO2 22 - 32 mmol/L 27  23  23    Calcium 8.9 - 10.3 mg/dL 95.2  84.1  32.4    BNP nl  Trop neg x2  Assessment and Plan: * Pericardial effusion Likely to need pericardiocentesis / pericardial window. NPO per cards Management per cardiology / CVTS. Sounds like he needs pericardial window Tele monitor CT C/A/P -> mild hypercalcemia + recurrent effusion of unclear etiology + FHx of cancer in brother. 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  Hypercalcemia Mild and unclear etiology. Check PTH, PTHrp Check CT C/A/P Though wondering if this is more of a chronic metabolic issue given h/o multiple kidney stones, calcium ox crystals in urine, etc.  AKI (acute kidney injury) Mild AKI, possibly due to effusion? Also looks like he's got mild chronic rhabdo? Check UA CT C/A/P given he's also got mild hypercalcemia of unclear etiology Check CPK Strict intake and output      Advance Care Planning:   Code Status: Full Code  Consults: Dr. Jacques Navy  Family Communication: Family at bedside  Severity of Illness: The appropriate patient status for this patient is INPATIENT. Inpatient status is judged to be reasonable and necessary in order to provide the required intensity of service to ensure the patient's safety. The patient's presenting symptoms, physical exam findings, and initial radiographic and laboratory data in the context of their chronic comorbidities is felt to place them at high risk for further clinical deterioration. Furthermore, it is not anticipated that the patient will be  medically stable for discharge from the hospital within 2 midnights of admission.   * I certify that at the point of admission it is my clinical judgment that the patient will require inpatient hospital care spanning beyond 2 midnights from the point of admission due to high intensity of service, high risk for further deterioration and high frequency of surveillance required.*  Author: Hillary Bow., DO 06/07/2022 9:25 PM  For on call review [email or web address].

## 2022-06-07 NOTE — ED Triage Notes (Addendum)
Pt c/o L upper rib/chest pain  that started this am; tightness, tingling; denies sob, denies N/V; denies hx of same, nothing makes better or worse; hx of pericardiocentesis

## 2022-06-07 NOTE — ED Provider Notes (Signed)
[place] EMERGENCY DEPARTMENT AT [facility] Provider Note   CSN: [id number] Arrival date & time: 06/07/22  1246     History  Chief Complaint  Patient presents with   Shortness of Breath    Christopher Lara is a 47 y.o. male.  Patient here with some chest discomfort.  He is concerned about fluid building up around his heart again.  He had COVID and then developed fluid around his heart and had to have fluid drained.  He was seen by cardiologist couple days ago and the fluid started to collect again.  He started to have some discomfort around his left chest wall area.  Denies any shortness of breath.  No weakness or numbness or tingling.  The history is provided by the patient.       Home Medications Prior to Admission medications   Medication Sig Start Date End Date Taking? Authorizing Provider  acetaminophen (TYLENOL) 650 MG CR tablet Take 1 tablet (650 mg total) by mouth every 4 (four) hours as needed for pain. Patient not taking: Reported on 04/24/2022 04/21/22   Rolly Salter, MD  omeprazole (PRILOSEC) 20 MG capsule Take 1 capsule (20 mg total) by mouth daily. 30 minutes prior to meal. Patient not taking: Reported on 10/01/2014 09/21/14 11/06/14  Ladona Mow, PA-C      Allergies    Patient has no known allergies.    Review of Systems   Review of Systems  Physical Exam Updated Vital Signs BP (!) 137/103   Pulse 61   Temp 97.8 F (36.6 C)   Resp 19   SpO2 99%  Physical Exam Vitals and nursing note reviewed.  Constitutional:      General: He is not in acute distress.    Appearance: He is well-developed. He is not ill-appearing.  HENT:     Head: Normocephalic and atraumatic.  Eyes:     Conjunctiva/sclera: Conjunctivae normal.     Pupils: Pupils are equal, round, and reactive to light.  Cardiovascular:     Rate and Rhythm: Normal rate and regular rhythm.     Pulses: Normal pulses.     Heart sounds: Normal heart sounds. No murmur heard. Pulmonary:      Effort: Pulmonary effort is normal. No respiratory distress.     Breath sounds: Normal breath sounds. No decreased breath sounds.  Abdominal:     Palpations: Abdomen is soft.     Tenderness: There is no abdominal tenderness.  Musculoskeletal:        General: No swelling.     Cervical back: Normal range of motion and neck supple.  Skin:    General: Skin is warm and dry.     Capillary Refill: Capillary refill takes less than 2 seconds.  Neurological:     Mental Status: He is alert.  Psychiatric:        Mood and Affect: Mood normal.     ED Results / Procedures / Treatments   Labs (all labs ordered are listed, but only abnormal results are displayed) Labs Reviewed  BASIC METABOLIC PANEL - Abnormal; Notable for the following components:      Result Value   Creatinine, Ser 1.43 (*)    Calcium 10.7 (*)    All other components within normal limits  CBC - Abnormal; Notable for the following components:   RBC 3.75 (*)    Hemoglobin 12.4 (*)    HCT 36.4 (*)    All other components within normal limits  BRAIN  NATRIURETIC PEPTIDE  TROPONIN I (HIGH SENSITIVITY)  TROPONIN I (HIGH SENSITIVITY)    EKG EKG Interpretation  Date/Time:  Thursday June 07 2022 13:39:11 EDT Ventricular Rate:  65 PR Interval:  158 QRS Duration: 90 QT Interval:  424 QTC Calculation: 440 R Axis:   119 Text Interpretation: Normal sinus rhythm Left posterior fascicular block Nonspecific T wave abnormality Abnormal ECG When compared with ECG of 18-Apr-2022 04:45, PREVIOUS ECG IS PRESENT Confirmed by Virgina Norfolk [phone]) on 06/07/2022 6:59:13 PM  Radiology DG Chest 1 View  Result Date: 06/07/2022 CLINICAL DATA:  Chest pain EXAM: CHEST  1 VIEW COMPARISON:  04/18/2022 FINDINGS: Stable cardiomegaly without acute airspace process, pneumonia, edema, effusion or pneumothorax. Trachea midline. No acute osseous finding. IMPRESSION: Cardiomegaly without acute process. Electronically Signed   By: Judie Petit.  Shick M.D.   On:  06/07/2022 14:35    Procedures Procedures      EMERGENCY DEPARTMENT [place] CARDIAC EXAM "Study: Limited Ultrasound of the Heart and Pericardium"  INDICATIONS:Chest pain Multiple views of the heart and pericardium were obtained in real-time with a multi-frequency probe.  PERFORMED QP:YPPJKD IMAGES ARCHIVED?: No LIMITATIONS:  None VIEWS USED: Parasternal long axis INTERPRETATION: Pericardial effusion present and Cardiac tamponade absent  Medications Ordered in ED Medications - No data to display  ED Course/ Medical Decision Making/ A&P                             Medical Decision Making Risk Decision regarding hospitalization.   [name] is here with chest pain.  Normal vitals.  No fever.  History of recent COVID complicated by pericarditis and pericardial effusion.  He was seen by cardiology couple days ago and had an ultrasound a few weeks ago that showed may be some reaccumulation of fluid around the heart.  He started to feel little bit more discomfort today.  On my bedside ultrasound he does have a fairly large pericardial effusion but there is no evidence of tamponade.  He has normal vitals.  Normal blood pressure.  There is no early collapse of the right ventricle on my ultrasound.  I contacted Dr. Jacques Navy with cardiology who also came down to do a bedside ultrasound who agreed.  Hemodynamically stable.  Plan to be admitted to medicine as likely will need CT surgery consultation for maybe another pericardiocentesis/window/pericardial drain.  Lab work was collected and per my review and interpretation are unremarkable.  There is no significant anemia or electrolyte abnormality or kidney injury or leukocytosis.  Troponin normal.  BNP is normal.  Overall we will admit to medicine.  This chart was dictated using voice recognition software.  Despite best efforts to proofread,  errors can occur which can change the documentation meaning.         Final Clinical Impression(s) /  ED Diagnoses Final diagnoses:  Pericardial effusion    Rx / DC Orders ED Discharge Orders     None         Virgina Norfolk, DO 06/07/22 2031

## 2022-06-07 NOTE — Consult Note (Signed)
Cardiology Consultation   Patient ID: Christopher Lara MRN: [id number]; DOB: Oct 08, 1975  Admit date: 06/07/2022 Date of Consult: 06/07/2022  PCP:  Georganna SkeansWilson, Amelia, MD   [facility] Providers Cardiologist:  Peter SwazilandJordan, MD        Patient Profile:   Christopher Lara is a 47 y.o. male with a hx of recurrent pericardial effusion, syncope, hyponatremia, and anemia who is being seen 06/07/2022 for the evaluation of chest pain at the request of Dr. Lockie Molauratolo.  History of Present Illness:   Christopher Lara has a history of severe hyponatremia in the setting of COVID-pneumonia in January 2021.  He was seen in [facility] ED in December 2022 for syncope that occurred following a hot shower.  He developed tunnel vision and a sensation that things were going dark before he passed out and hit his face.  He was hospitalized in February 2024 with severe hyponatremia, altered mental status following recurrent COVID-19 infection.  He was hypertensive upon arrival.  He was found to have a large pericardial effusion.  Cardiology was consulted.  Troponin was negative.  Sodium was 105.  CK was significantly elevated,  rhabdomyolysis.  Echocardiogram showed EF 70 to 75%, large pericardial effusion, no evidence of cardiac tamponade.  Sodium improved to 120.  He underwent pericardiocentesis with removal of 1 L of fluid on 04/19/2022.  Initial fluid analysis was consistent with transudative effusion. Limited echo on 04/19/2022 demonstrated EF 65 to 70%, trivial pericardial effusion.  He was last seen in the office on 04/24/2022 and was stable overall from a cardiac standpoint.  He denied any chest pain or shortness of breath.  Repeat limited echo on 05/23/2022 showed EF 60 to 65%, normal LV function, large pericardial effusion, no evidence of tamponade.  It was noted that should he have recurrent symptoms, he would likely require pericardial window given recurrent effusion.  Presents to the ED today hypertensive and  bradycardic with waxing and waning chest pain and a recent echocardiogram as noted above with large pericardial effusion.  He and his wife were concerned and they came into the emergency department.  He is hemodynamically stable and in no respiratory distress.  Given large pericardial effusion and chest pain, I performed an urgent bedside echocardiogram on presentation to the bedside.  LVEF normal 60 to 65%, RV size and function normal.  Large pericardial effusion that is circumferential with swinging cardiac motion.  No evidence of RV diastolic collapse is noted, RA pressure estimate is 3 mmHg with a normal size collapsible IVC.  These findings were reviewed in real-time with the patient and his wife.  He is currently lying comfortably in bed.  Wife at the bedside.  Past Medical History:  Diagnosis Date   Kidney stone    Kidney stone    Kidney stones    Kidney stones     Past Surgical History:  Procedure Laterality Date   PERICARDIOCENTESIS N/A 04/19/2022   Procedure: PERICARDIOCENTESIS;  Surgeon: Kathleene HazelMcAlhany, Christopher D, MD;  [facility];  Service: Cardiovascular;  Laterality: N/A;     Home Medications:  Prior to Admission medications   Medication Sig Start Date End Date Taking? Authorizing Provider  acetaminophen (TYLENOL) 650 MG CR tablet Take 1 tablet (650 mg total) by mouth every 4 (four) hours as needed for pain. Patient not taking: Reported on 04/24/2022 04/21/22   Rolly SalterPatel, Pranav M, MD  omeprazole (PRILOSEC) 20 MG capsule Take 1 capsule (20 mg total) by mouth daily. 30  minutes prior to meal. Patient not taking: Reported on 10/01/2014 09/21/14 11/06/14  Ladona Mow, PA-C    Inpatient Medications: Scheduled Meds:  Continuous Infusions:  PRN Meds:   Allergies:   No Known Allergies  Social History:   Social History   Socioeconomic History   Marital status: Married    Spouse name: Not on file   Number of children: Not on file   Years of education: Not on file    Highest education level: Not on file  Occupational History   Not on file  Tobacco Use   Smoking status: Never    Passive exposure: Never   Smokeless tobacco: Never  Substance and Sexual Activity   Alcohol use: No   Drug use: No   Sexual activity: Not on file  Other Topics Concern   Not on file  Social History Narrative   Not on file   Social Determinants of Health   Financial Resource Strain: Not on file  Food Insecurity: No Food Insecurity (04/18/2022)   Hunger Vital Sign    Worried About Running Out of Food in the Last Year: Never true    Ran Out of Food in the Last Year: Never true  Transportation Needs: No Transportation Needs (04/18/2022)   PRAPARE - Administrator, Civil Service (Medical): No    Lack of Transportation (Non-Medical): No  Physical Activity: Not on file  Stress: Not on file  Social Connections: Not on file  Intimate Partner Violence: Not At Risk (04/18/2022)   Humiliation, Afraid, Rape, and Kick questionnaire    Fear of Current or Ex-Partner: No    Emotionally Abused: No    Physically Abused: No    Sexually Abused: No    Family History:    Family History  Problem Relation Age of Onset   Heart failure Father    Heart attack Father    Cancer Brother      ROS:  Please see the history of present illness.   All other ROS reviewed and negative.     Physical Exam/Data:   Vitals:   06/07/22 1336 06/07/22 1753 06/07/22 2000  BP: (!) 146/102 (!) 132/102 (!) 137/103  Pulse: 65 (!) 59 61  Resp: 18 17 19   Temp: 97.7 F (36.5 C) 97.8 F (36.6 C)   SpO2: 100% 100% 99%   No intake or output data in the 24 hours ending 06/07/22 2111    06/05/2022    3:17 PM 05/16/2022    2:14 PM 04/24/2022    3:19 PM  Last 3 Weights  Weight (lbs) 176 lb 12.8 oz 173 lb 181 lb 3.2 oz  Weight (kg) 80.196 kg 78.472 kg 82.192 kg     There is no height or weight on file to calculate BMI.  Constitutional: No acute distress Eyes: sclera non-icteric, normal  conjunctiva and lids ENMT: normal dentition, moist mucous membranes Cardiovascular: regular rhythm, normal rate, no murmur. S1 and S2 normal. No jugular venous distention.  Distant heart sounds Respiratory: clear to auscultation bilaterally GI : normal bowel sounds, soft and nontender. No distention.   MSK: extremities warm, well perfused. No edema.  NEURO: grossly nonfocal exam, moves all extremities. PSYCH: alert and oriented x 3, normal mood and affect.    EKG:  The EKG was personally reviewed and demonstrates: Sinus rhythm rate 65 low voltage, nonspecific T wave abnormality Telemetry:  Telemetry was personally reviewed and demonstrates: Sinus rhythm and sinus bradycardia  Relevant CV Studies: Bedside echocardiogram as per  HPI.  Laboratory Data:  High Sensitivity Troponin:   Recent Labs  Lab 06/07/22 1403 06/07/22 1722  TROPONINIHS 3 3     Chemistry Recent Labs  Lab 06/05/22 1619 06/07/22 1403  NA 142 138  K 4.4 4.0  CL 106 104  CO2 23 27  GLUCOSE 81 84  BUN 11 10  CREATININE 1.37* 1.43*  CALCIUM 11.2* 10.7*  GFRNONAA  --  >60  ANIONGAP  --  7    No results for input(s): "PROT", "ALBUMIN", "AST", "ALT", "ALKPHOS", "BILITOT" in the last 168 hours. Lipids No results for input(s): "CHOL", "TRIG", "HDL", "LABVLDL", "LDLCALC", "CHOLHDL" in the last 168 hours.  Hematology Recent Labs  Lab 06/05/22 1619 06/07/22 1403  WBC 4.2 4.1  RBC 3.95* 3.75*  HGB 13.0 12.4*  HCT 38.2 36.4*  MCV 97 97.1  MCH 32.9 33.1  MCHC 34.0 34.1  RDW 14.1 13.4  PLT 237 226   Thyroid No results for input(s): "TSH", "FREET4" in the last 168 hours.  BNP Recent Labs  Lab 06/07/22 1429  BNP 34.3    DDimer No results for input(s): "DDIMER" in the last 168 hours.   Radiology/Studies:  DG Chest 1 View  Result Date: 06/07/2022 CLINICAL DATA:  Chest pain EXAM: CHEST  1 VIEW COMPARISON:  04/18/2022 FINDINGS: Stable cardiomegaly without acute airspace process, pneumonia, edema, effusion  or pneumothorax. Trachea midline. No acute osseous finding. IMPRESSION: Cardiomegaly without acute process. Electronically Signed   By: Judie Petit.  Shick M.D.   On: 06/07/2022 14:35     Assessment and Plan:   Principal Problem:   Pericardial effusion Active Problems:   AKI (acute kidney injury)   Hypercalcemia  -Patient presents with a recurrent symptomatic pericardial effusion, imaged on 05/23/2022 as large with no tamponade physiology, now patient is symptomatic.  This will require definitive management.  I have discussed various strategies with the patient and his wife including pericardiocentesis with 72 hours of catheter insertion to ensure pericardium adheres parietal and visceral, versus pericardial window.  Will evaluate in a.m. and keep patient n.p.o. -ESR and CRP in the morning -No clinical or echocardiographic evidence of tamponade physiology at approximately 8:30 PM.  If he becomes hypotensive or tachycardic, that is a clinical change and may require more urgent pericardiocentesis. -If no contraindications, consider colchicine for chest pain if it increases.   Risk Assessment/Risk Scores:       For questions or updates, please contact [facility] Please consult [email or web address] for contact info under    Signed, Parke Poisson, MD  06/07/2022 9:11 PM

## 2022-06-08 ENCOUNTER — Inpatient Hospital Stay (HOSPITAL_COMMUNITY): Payer: Federal, State, Local not specified - PPO

## 2022-06-08 DIAGNOSIS — I3139 Other pericardial effusion (noninflammatory): Secondary | ICD-10-CM

## 2022-06-08 LAB — COMPREHENSIVE METABOLIC PANEL
ALT: 23 U/L (ref 0–44)
AST: 66 U/L — ABNORMAL HIGH (ref 15–41)
Albumin: 4 g/dL (ref 3.5–5.0)
Alkaline Phosphatase: 39 U/L (ref 38–126)
Anion gap: 6 (ref 5–15)
BUN: 11 mg/dL (ref 6–20)
CO2: 26 mmol/L (ref 22–32)
Calcium: 10.6 mg/dL — ABNORMAL HIGH (ref 8.9–10.3)
Chloride: 104 mmol/L (ref 98–111)
Creatinine, Ser: 1.35 mg/dL — ABNORMAL HIGH (ref 0.61–1.24)
GFR, Estimated: >60 mL/min (ref >60)
Glucose, Bld: 105 mg/dL — ABNORMAL HIGH (ref 70–99)
Potassium: 3.9 mmol/L (ref 3.5–5.1)
Sodium: 136 mmol/L (ref 135–145)
Total Bilirubin: 0.7 mg/dL (ref 0.3–1.2)
Total Protein: 6.8 g/dL (ref 6.5–8.1)

## 2022-06-08 LAB — SEDIMENTATION RATE: Sed Rate: 23 mm/hr — ABNORMAL HIGH (ref 0–16)

## 2022-06-08 LAB — CBC
HCT: 35.2 % — ABNORMAL LOW (ref 39.0–52.0)
Hemoglobin: 12 g/dL — ABNORMAL LOW (ref 13.0–17.0)
MCH: 32.9 pg (ref 26.0–34.0)
MCHC: 34.1 g/dL (ref 30.0–36.0)
MCV: 96.4 fL (ref 80.0–100.0)
Platelets: 227 10*3/uL (ref 150–400)
RBC: 3.65 MIL/uL — ABNORMAL LOW (ref 4.22–5.81)
RDW: 13.2 % (ref 11.5–15.5)
WBC: 4.2 10*3/uL (ref 4.0–10.5)
nRBC: 0 % (ref 0.0–0.2)

## 2022-06-08 LAB — LACTATE DEHYDROGENASE: LDH: 374 U/L — ABNORMAL HIGH (ref 98–192)

## 2022-06-08 LAB — C-REACTIVE PROTEIN: CRP: 1.3 mg/dL — ABNORMAL HIGH (ref <1.0)

## 2022-06-08 LAB — ECHOCARDIOGRAM LIMITED
Height: 68 in
Weight: 2784 oz

## 2022-06-08 MED ORDER — COLCHICINE 0.6 MG PO TABS
0.6000 mg | ORAL_TABLET | Freq: Two times a day (BID) | ORAL | Status: DC
  Administered 2022-06-08 – 2022-06-15 (×15): 0.6 mg via ORAL
  Filled 2022-06-08 (×15): qty 1

## 2022-06-08 NOTE — Consult Note (Cosign Needed Addendum)
[address]       [name] [address]             [id number]        Reason for Consult: Chronic pericardial effusion Referring Physician: Hospitalist/cardiology  Christopher Lara is an 47 y.o. male.  HPI: HPI: The patient is a 47 year old male who has a history of previous pericardial effusion with pericardiocentesis in February 2024 by Dr. Clifton James.  Additionally, he has a history significant for SIADH and hyponatremia with COVID in January 2021.  He had a recurrence of these as well as rhabdomyolysis with a repeat COVID infection in January 2024.  It was at that time that he was noted to have this pericardial effusion.  1 L of fluid was removed at that time on 04/19/2022.  Initial fluid analysis was consistent with transudative effusion.  Echocardiogram at that time revealed EF of 65 to 70%.  A repeat limited echo in March showed EF 60 to 65% with large pericardial effusion but no evidence of tamponade.  He presented to the emergency department on 06/07/2022 with hypertension and bradycardia and chest pain an echocardiogram again showed a large pericardial effusion.  We are asked to see the patient for consideration of pericardial window.  Diagnostic evaluation has also revealed mild hypercalcemia of unclear etiology as well as acute kidney injury with creatinine elevated to 1.43.  He does have a history of kidney stones.  Sodium is currently in the normal range.  A CT of the chest abdomen and pelvis was performed and this did confirm findings of the pericardial effusion.  He has a nonobstructive left sided kidney stone as well.  Otherwise, the study is felt to be unremarkable.  Past Medical History:  Diagnosis Date   Kidney stone    Kidney stone    Kidney stones    Kidney stones     Past Surgical History:  Procedure Laterality Date   PERICARDIOCENTESIS N/A 04/19/2022   Procedure: PERICARDIOCENTESIS;  Surgeon: Kathleene Hazel, MD;  Location: [facility] INVASIVE CV  LAB;  Service: Cardiovascular;  Laterality: N/A;    Family History  Problem Relation Age of Onset   Heart failure Father    Heart attack Father    Cancer Brother     Social History:  reports that he has never smoked. He has never been exposed to tobacco smoke. He has never used smokeless tobacco. He reports that he does not drink alcohol and does not use drugs.  Allergies: No Known Allergies  Medications: Scheduled Meds:  colchicine  0.6 mg Oral BID    Current Outpatient Medications  Medication Instructions   acetaminophen (TYLENOL) 650 mg, Oral, Every 4 hours PRN    Continuous Infusions: PRN Meds:.acetaminophen **OR** acetaminophen, ondansetron **OR** ondansetron (ZOFRAN) IV   Results for orders placed or performed during the hospital encounter of 06/07/22 (from the past 48 hour(s))  Basic metabolic panel     Status: Abnormal   Collection Time: 06/07/22  2:03 PM  Result Value Ref Range   Sodium 138 135 - 145 mmol/L   Potassium 4.0 3.5 - 5.1 mmol/L   Chloride 104 98 - 111 mmol/L   CO2 27 22 - 32 mmol/L   Glucose, Bld 84 70 - 99 mg/dL    Comment: Glucose reference range applies only to samples taken after fasting for at least 8 hours.   BUN 10 6 - 20 mg/dL   Creatinine, Ser 9.14 (H) 0.61 -  1.24 mg/dL   Calcium 91.4 (H) 8.9 - 10.3 mg/dL   GFR, Estimated >78 >29 mL/min    Comment: (NOTE) Calculated using the CKD-EPI Creatinine Equation (2021)    Anion gap 7 5 - 15    Comment: Performed at [facility], [address], [place], [place] [address]  CBC     Status: Abnormal   Collection Time: 06/07/22  2:03 PM  Result Value Ref Range   WBC 4.1 4.0 - 10.5 K/uL   RBC 3.75 (L) 4.22 - 5.81 MIL/uL   Hemoglobin 12.4 (L) 13.0 - 17.0 g/dL   HCT 08.6 (L) 57.8 - 46.9 %   MCV 97.1 80.0 - 100.0 fL   MCH 33.1 26.0 - 34.0 pg   MCHC 34.1 30.0 - 36.0 g/dL   RDW 62.9 52.8 - 41.3 %   Platelets 226 150 - 400 K/uL   nRBC 0.0 0.0 - 0.2 %    Comment: Performed at [facility]  Lab, [address] [place], [place] [address]  Troponin I (High Sensitivity)     Status: None   Collection Time: 06/07/22  2:03 PM  Result Value Ref Range   Troponin I (High Sensitivity) 3 <18 ng/L    Comment: (NOTE) Elevated high sensitivity troponin I (hsTnI) values and significant  changes across serial measurements may suggest ACS but many other  chronic and acute conditions are known to elevate hsTnI results.  Refer to the "Links" section for chest pain algorithms and additional  guidance. Performed at [facility], [address], [place], [place] [address]   Brain natriuretic peptide     Status: None   Collection Time: 06/07/22  2:29 PM  Result Value Ref Range   B Natriuretic Peptide 34.3 0.0 - 100.0 pg/mL    Comment: Performed at [facility], [address], [place], [place] [address]  Troponin I (High Sensitivity)     Status: None   Collection Time: 06/07/22  5:22 PM  Result Value Ref Range   Troponin I (High Sensitivity) 3 <18 ng/L    Comment: (NOTE) Elevated high sensitivity troponin I (hsTnI) values and significant  changes across serial measurements may suggest ACS but many other  chronic and acute conditions are known to elevate hsTnI results.  Refer to the "Links" section for chest pain algorithms and additional  guidance. Performed at [facility], [address] [place], [place] [address]   CK     Status: Abnormal   Collection Time: 06/07/22  9:26 PM  Result Value Ref Range   Total CK 1,764 (H) 49 - 397 U/L    Comment: Performed at [facility], [address], [place], [place] [address]  Urinalysis, Complete w Microscopic -Urine, Clean Catch     Status: Abnormal   Collection Time: 06/07/22  9:29 PM  Result Value Ref Range   Color, Urine STRAW (A) YELLOW   APPearance CLEAR CLEAR   Specific Gravity, Urine 1.011 1.005 - 1.030   pH 7.0 5.0 - 8.0   Glucose, UA NEGATIVE NEGATIVE mg/dL   Hgb urine dipstick NEGATIVE NEGATIVE   Bilirubin  Urine NEGATIVE NEGATIVE   Ketones, ur NEGATIVE NEGATIVE mg/dL   Protein, ur NEGATIVE NEGATIVE mg/dL   Nitrite NEGATIVE NEGATIVE   Leukocytes,Ua NEGATIVE NEGATIVE   RBC / HPF 0-5 0 - 5 RBC/hpf   WBC, UA 0-5 0 - 5 WBC/hpf   Bacteria, UA NONE SEEN NONE SEEN   Squamous  Epithelial / HPF 0-5 0 - 5 /HPF   Mucus PRESENT     Comment: Performed at [facility], [address] [place], [place] [address]  Sedimentation rate     Status: Abnormal   Collection Time: 06/08/22 12:58 AM  Result Value Ref Range   Sed Rate 23 (H) 0 - 16 mm/hr    Comment: Performed at [facility], [address] [place], [place] [address]  C-reactive protein     Status: Abnormal   Collection Time: 06/08/22 12:58 AM  Result Value Ref Range   CRP 1.3 (H) <1.0 mg/dL    Comment: POST-ULTRACENTRIFUGATION Performed at [facility], [address], [place], [place] [address]   CBC     Status: Abnormal   Collection Time: 06/08/22 12:58 AM  Result Value Ref Range   WBC 4.2 4.0 - 10.5 K/uL   RBC 3.65 (L) 4.22 - 5.81 MIL/uL   Hemoglobin 12.0 (L) 13.0 - 17.0 g/dL   HCT 91.4 (L) 78.2 - 95.6 %   MCV 96.4 80.0 - 100.0 fL   MCH 32.9 26.0 - 34.0 pg   MCHC 34.1 30.0 - 36.0 g/dL   RDW 21.3 08.6 - 57.8 %   Platelets 227 150 - 400 K/uL   nRBC 0.0 0.0 - 0.2 %    Comment: Performed at [facility], [address], [place], [place] [address]  Comprehensive metabolic panel     Status: Abnormal   Collection Time: 06/08/22 12:58 AM  Result Value Ref Range   Sodium 136 135 - 145 mmol/L   Potassium 3.9 3.5 - 5.1 mmol/L   Chloride 104 98 - 111 mmol/L   CO2 26 22 - 32 mmol/L   Glucose, Bld 105 (H) 70 - 99 mg/dL    Comment: Glucose reference range applies only to samples taken after fasting for at least 8 hours.   BUN 11 6 - 20 mg/dL   Creatinine, Ser 9.52 (H) 0.61 - 1.24 mg/dL   Calcium 84.1 (H) 8.9 - 10.3 mg/dL   Total Protein 6.8 6.5 - 8.1 g/dL   Albumin 4.0 3.5 - 5.0 g/dL   AST 66 (H) 15 - 41 U/L   ALT  23 0 - 44 U/L   Alkaline Phosphatase 39 38 - 126 U/L   Total Bilirubin 0.7 0.3 - 1.2 mg/dL   GFR, Estimated >32 >44 mL/min    Comment: (NOTE) Calculated using the CKD-EPI Creatinine Equation (2021)    Anion gap 6 5 - 15    Comment: Performed at [facility], [address], [place], [place] [address]    CT CHEST ABDOMEN PELVIS WO CONTRAST  Result Date: 06/07/2022 CLINICAL DATA:  Pericardial effusion hypercalcemia left-sided chest pain EXAM: CT CHEST, ABDOMEN AND PELVIS WITHOUT CONTRAST TECHNIQUE: Multidetector CT imaging of the chest, abdomen and pelvis was performed following the standard protocol without IV contrast. RADIATION DOSE REDUCTION: This exam was performed according to the departmental dose-optimization program which includes automated exposure control, adjustment of the mA and/or kV according to patient size and/or use of iterative reconstruction technique. COMPARISON:  Chest x-ray 06/07/2022 FINDINGS: CT CHEST FINDINGS Cardiovascular: Limited evaluation without intravenous contrast. Nonaneurysmal aorta. Mild atherosclerosis. Large circumferential pericardial effusion measuring up to 2.5 cm. Mediastinum/Nodes: Midline trachea. No thyroid mass. No suspicious lymph nodes. Esophagus within normal limits. Lungs/Pleura: No acute airspace disease or effusion. Mild subpleural scarring. Musculoskeletal: No chest wall mass or suspicious bone lesions identified. CT ABDOMEN  PELVIS FINDINGS Hepatobiliary: No focal liver abnormality is seen. No gallstones, gallbladder wall thickening, or biliary dilatation. Pancreas: Unremarkable. No pancreatic ductal dilatation or surrounding inflammatory changes. Spleen: Normal in size without focal abnormality. Adrenals/Urinary Tract: Adrenal glands are normal. 5 mm left kidney stone. Urinary bladder is unremarkable. Kidneys show no hydronephrosis Stomach/Bowel: Stomach nonenlarged. No dilated small bowel. No acute bowel wall thickening. Negative appendix.  Vascular/Lymphatic: Nonaneurysmal aorta.  No suspicious lymph nodes Reproductive: Negative prostate. Other: Negative for free air or free fluid. Musculoskeletal: Chronic compression deformity at T10 IMPRESSION: 1. Large circumferential pericardial effusion measuring up to 2.5 cm. 2. No CT evidence for acute intra-abdominal or pelvic abnormality. 3. Nonobstructing left kidney stone. Electronically Signed   By: Jasmine Pang M.D.   On: 06/07/2022 23:33   DG Chest 1 View  Result Date: 06/07/2022 CLINICAL DATA:  Chest pain EXAM: CHEST  1 VIEW COMPARISON:  04/18/2022 FINDINGS: Stable cardiomegaly without acute airspace process, pneumonia, edema, effusion or pneumothorax. Trachea midline. No acute osseous finding. IMPRESSION: Cardiomegaly without acute process. Electronically Signed   By: Judie Petit.  Shick M.D.   On: 06/07/2022 14:35    Review of Systems  Constitutional: Negative.   HENT:  Positive for congestion and rhinorrhea.   Eyes: Negative.   Respiratory:  Positive for chest tightness. Negative for shortness of breath.   Cardiovascular:  Negative for chest pain.  Gastrointestinal: Negative.   Genitourinary: Negative.   Musculoskeletal: Negative.   Skin: Negative.   Neurological: Negative.   Hematological: Negative.   Psychiatric/Behavioral: Negative.     Blood pressure 115/79, pulse 66, temperature 97.7 F (36.5 C), temperature source Oral, resp. rate 17, height 5\' 8"  (1.727 m), weight 78.9 kg, SpO2 98 %. Physical Exam Constitutional:      General: He is not in acute distress.    Appearance: He is well-developed. He is not ill-appearing.  HENT:     Head: Normocephalic and atraumatic.     Mouth/Throat:     Pharynx: Oropharynx is clear. No pharyngeal swelling or oropharyngeal exudate.  Eyes:     Extraocular Movements: Extraocular movements intact.     Pupils: Pupils are equal, round, and reactive to light.  Neck:     Thyroid: No thyromegaly.  Cardiovascular:     Rate and Rhythm: Normal rate  and regular rhythm.     Pulses: Normal pulses.     Heart sounds: No murmur heard.    No gallop.     Comments: Somewhat distant heart sounds Pulmonary:     Effort: Pulmonary effort is normal. No respiratory distress.     Breath sounds: Normal breath sounds.  Chest:     Chest wall: No deformity or tenderness.  Abdominal:     General: Bowel sounds are normal.     Palpations: Abdomen is soft. There is no hepatomegaly or mass.  Musculoskeletal:     Cervical back: Normal range of motion and neck supple.     Comments: Trace LE edema  Lymphadenopathy:     Cervical: No cervical adenopathy.  Skin:    General: Skin is warm and dry.     Capillary Refill: Capillary refill takes less than 2 seconds.     Coloration: Skin is not cyanotic or pale.  Neurological:     General: No focal deficit present.     Mental Status: He is alert and oriented to person, place, and time.  Psychiatric:        Mood and Affect: Mood normal.  Behavior: Behavior normal.     Assessment/Plan: Recurrent pericardial effusion.  The surgeon will evaluate the patient and studies but it appears he would benefit from pericardial window.A limited echo was also obtained  Rowe Clack PA-C 06/08/2022, 9:35 AM   Agree with above Large recurrent pericardial effusion, with pre-tamponade features.  Clinically stable Will need CT chest Will plan for pericardial window  Harrell O Lightfoot

## 2022-06-08 NOTE — Progress Notes (Addendum)
Rounding Note    Patient Name: Christopher Lara Date of Encounter: 06/08/2022  [facility] Cardiologist: Peter Swaziland, MD   Subjective   Patient resting comfortably in bed this morning. Reports that pain has improved from 4/10 to 1/10. Denies shortness of breath or palpitations.  Inpatient Medications    Scheduled Meds:  Continuous Infusions:  PRN Meds: acetaminophen **OR** acetaminophen, ondansetron **OR** ondansetron (ZOFRAN) IV   Vital Signs    Vitals:   06/07/22 2301 06/08/22 0011 06/08/22 0451 06/08/22 0725  BP:  (!) 125/97 120/81 115/79  Pulse: 60 62 63 66  Resp:  Temp:  97.7 F (36.5 C) (!) 97.4 F (36.3 C) 97.7 F (36.5 C)  TempSrc:  Oral Oral Oral  SpO2:  97% 98% 98%  Weight:  78.9 kg    Height:   (1.727 m)      Intake/Output Summary (Last 24 hours) at 06/08/2022 0902 Last data filed at 06/08/2022 0457 Gross per 24 hour  Intake 0 ml  Output 600 ml  Net -600 ml      06/08/2022   12:11 AM 06/05/2022    3:17 PM 05/16/2022    2:14 PM  Last 3 Weights  Weight (lbs) 174 lb 176 lb 12.8 oz 173 lb  Weight (kg) 78.926 kg 80.196 kg 78.472 kg      Telemetry    Sinus rhythm/sinus bradycardia - Personally Reviewed  ECG    No new tracing - Personally Reviewed  Physical Exam   GEN: No acute distress.   Neck: No JVD Cardiac: RRR, no murmurs, rubs, or gallops.  Respiratory: Clear to auscultation bilaterally. GI: Soft, nontender, non-distended  MS: No edema; No deformity. Neuro:  Nonfocal  Psych: Normal affect   Labs    High Sensitivity Troponin:   Recent Labs  Lab 06/07/22 1403 06/07/22 1722  TROPONINIHS 3 3     Chemistry Recent Labs  Lab 06/05/22 1619 06/07/22 1403 06/08/22 0058  NA 142 138 136  K 4.4 4.0 3.9  CL 106 104 104  CO2 GLUCOSE 81 84 105*  BUN CREATININE 1.37* 1.43* 1.35*  CALCIUM 11.2* 10.7* 10.6*  PROT  --   --  6.8  ALBUMIN  --   --  4.0  AST  --   --  66*  ALT  --   --  23   ALKPHOS  --   --  39  BILITOT  --   --  0.7  GFRNONAA  --  >60 >60  ANIONGAP  --  7 6    Lipids No results for input(s): "CHOL", "TRIG", "HDL", "LABVLDL", "LDLCALC", "CHOLHDL" in the last 168 hours.  Hematology Recent Labs  Lab 06/05/22 1619 06/07/22 1403 06/08/22 0058  WBC 4.2 4.1 4.2  RBC 3.95* 3.75* 3.65*  HGB 13.0 12.4* 12.0*  HCT 38.2 36.4* 35.2*  MCV 97 97.1 96.4  MCH 32.9 33.1 32.9  MCHC 34.0 34.1 34.1  RDW 14.1 13.4 13.2  PLT 237 226 227   Thyroid No results for input(s): "TSH", "FREET4" in the last 168 hours.  BNP Recent Labs  Lab 06/07/22 1429  BNP 34.3    DDimer No results for input(s): "DDIMER" in the last 168 hours.   Radiology    CT CHEST ABDOMEN PELVIS WO CONTRAST  Result Date: 06/07/2022 CLINICAL DATA:  Pericardial effusion hypercalcemia left-sided chest pain EXAM: CT CHEST, ABDOMEN AND PELVIS WITHOUT CONTRAST TECHNIQUE: Multidetector CT  imaging of the chest, abdomen and pelvis was performed following the standard protocol without IV contrast. RADIATION DOSE REDUCTION: This exam was performed according to the departmental dose-optimization program which includes automated exposure control, adjustment of the mA and/or kV according to patient size and/or use of iterative reconstruction technique. COMPARISON:  Chest x-ray 06/07/2022 FINDINGS: CT CHEST FINDINGS Cardiovascular: Limited evaluation without intravenous contrast. Nonaneurysmal aorta. Mild atherosclerosis. Large circumferential pericardial effusion measuring up to 2.5 cm. Mediastinum/Nodes: Midline trachea. No thyroid mass. No suspicious lymph nodes. Esophagus within normal limits. Lungs/Pleura: No acute airspace disease or effusion. Mild subpleural scarring. Musculoskeletal: No chest wall mass or suspicious bone lesions identified. CT ABDOMEN PELVIS FINDINGS Hepatobiliary: No focal liver abnormality is seen. No gallstones, gallbladder wall thickening, or biliary dilatation. Pancreas: Unremarkable. No  pancreatic ductal dilatation or surrounding inflammatory changes. Spleen: Normal in size without focal abnormality. Adrenals/Urinary Tract: Adrenal glands are normal. 5 mm left kidney stone. Urinary bladder is unremarkable. Kidneys show no hydronephrosis Stomach/Bowel: Stomach nonenlarged. No dilated small bowel. No acute bowel wall thickening. Negative appendix. Vascular/Lymphatic: Nonaneurysmal aorta.  No suspicious lymph nodes Reproductive: Negative prostate. Other: Negative for free air or free fluid. Musculoskeletal: Chronic compression deformity at T10 IMPRESSION: 1. Large circumferential pericardial effusion measuring up to 2.5 cm. 2. No CT evidence for acute intra-abdominal or pelvic abnormality. 3. Nonobstructing left kidney stone. Electronically Signed   By: Jasmine Pang M.D.   On: 06/07/2022 23:33   DG Chest 1 View  Result Date: 06/07/2022 CLINICAL DATA:  Chest pain EXAM: CHEST  1 VIEW COMPARISON:  04/18/2022 FINDINGS: Stable cardiomegaly without acute airspace process, pneumonia, edema, effusion or pneumothorax. Trachea midline. No acute osseous finding. IMPRESSION: Cardiomegaly without acute process. Electronically Signed   By: Judie Petit.  Shick M.D.   On: 06/07/2022 14:35    Cardiac Studies   05/23/22 Limited TTE  IMPRESSIONS     1. Left ventricular ejection fraction, by estimation, is 60 to 65%. The  left ventricle has normal function. The left ventricle has no regional  wall motion abnormalities.   2. Large pericardial effusion. The pericardial effusion is  circumferential. There is no evidence of cardiac tamponade.   3. The aortic valve is tricuspid. Aortic valve regurgitation is not  visualized. No aortic stenosis is present.   4. The inferior vena cava is normal in size with greater than 50%  respiratory variability, suggesting right atrial pressure of 3 mmHg.   Comparison(s): Changes from prior study are noted. Recurrence of  pericardial effusion, last echo with trivial effusion  post  pericardiocentesis.   Conclusion(s)/Recommendation(s): Large pericardial effusion, maximum size  2.68 cm adjacent to RA. [facility] DOD Dr. Lynnette Caffey notified by  sonographer, patient clinically stable. No evidence of tamponade by echo.   FINDINGS   Left Ventricle: Left ventricular ejection fraction, by estimation, is 60  to 65%. The left ventricle has normal function. The left ventricle has no  regional wall motion abnormalities.   Pericardium: A large pericardial effusion is present. The pericardial  effusion is circumferential. There is no evidence of cardiac tamponade.   Aortic Valve: The aortic valve is tricuspid. Aortic valve regurgitation is  not visualized. No aortic stenosis is present.   Pulmonic Valve: The pulmonic valve was grossly normal.   Venous: The inferior vena cava is normal in size with greater than 50%  respiratory variability, suggesting right atrial pressure of 3 mmHg.    Patient Profile     Christopher Lara is a 47 y.o. male with  a hx of recurrent pericardial effusion, syncope, hyponatremia, and anemia who is being seen for the evaluation of chest pain at the request of Dr. Lockie Mola.  Assessment & Plan    Recurrent pericardial effusion  Patient presented to the ED hypertensive and bradycardic with waxing and waning chest pain and a recent echocardiogram as noted above with large pericardial effusion. Dr. Jacques Navy performed bedside echocardiogram and found LVEF normal at 60-65%, normal RV size/function. Large pericardial effusion that is circumferential with swinging cardiac motion. No evidence of RV diastolic collapse.   Patient still with pain this AM, though improved. Given symptoms, definitive management needed. Will consult CTCS for evaluation of pericardial window as with previous tap, we were unable to advance a pigtail catheter and effusion was just drained through dilator/drain not able to be left in place.  If this is not recommended, we would need  to consider tapping today.  ESR and CRP elevated, will add colchicine 0.6mg  BID.  Remains hemodynamically stable. Continue to monitor closely for signs of tamponade.      For questions or updates, please contact [facility] Please consult [email or web address] for contact info under        Signed, Perlie Gold, PA-C  06/08/2022, 9:02 AM    Patient seen and examined with Perlie Gold PA-C.  Agree as above, with the following exceptions and changes as noted below. Feeling well overall. Gen: NAD, CV: RRR, no murmurs, Lungs: clear, Abd: soft, Extrem: Warm, well perfused, no edema, Neuro/Psych: alert and oriented x 3, normal mood and affect. All available labs, radiology testing, previous records reviewed. Will have patient evaluated for pericardial window today by TCTS. CrP Sed rate mildly elevated, will initate colchicine. Formal echo limited echo today.  Parke Poisson, MD 06/08/22 10:25 AM

## 2022-06-08 NOTE — Plan of Care (Signed)
  Problem: Education: Goal: Knowledge of General Education information will improve Description: Including pain rating scale, medication(s)/side effects and non-pharmacologic comfort measures Outcome: Progressing   Problem: Clinical Measurements: Goal: Ability to maintain clinical measurements within normal limits will improve Outcome: Progressing   Problem: Coping: Goal: Level of anxiety will decrease Outcome: Progressing   

## 2022-06-08 NOTE — Progress Notes (Signed)
Echocardiogram 2D Echocardiogram has been performed.  Riely Oetken F Sylvanna Burggraf RDCS 06/08/2022, 11:04 AM 

## 2022-06-08 NOTE — Plan of Care (Signed)
  Problem: Clinical Measurements: Goal: Respiratory complications will improve Outcome: Progressing   Problem: Activity: Goal: Risk for activity intolerance will decrease Outcome: Progressing   

## 2022-06-08 NOTE — TOC Progression Note (Signed)
Transition of Care [facility]) - Progression Note    Patient Details  Name: Christopher Lara MRN: [id number] Date of Birth: February 03, 1976  Transition of Care [facility]) CM/SW Contact  Leone Haven, RN Phone Number: 06/08/2022, 2:27 PM  Clinical Narrative:    From home with wife, recurrent pericardial effusion, indep. TOC following.        Expected Discharge Plan and Services                                               Social Determinants of Health (SDOH) Interventions SDOH Screenings   Food Insecurity: No Food Insecurity (06/07/2022)  Housing: Low Risk  (06/07/2022)  Transportation Needs: No Transportation Needs (06/07/2022)  Utilities: Not At Risk (06/07/2022)  Depression (PHQ2-9): Low Risk  (05/16/2022)  Tobacco Use: Low Risk  (06/07/2022)    Readmission Risk Interventions     No data to display

## 2022-06-08 NOTE — Progress Notes (Signed)
PROGRESS NOTE  Christopher Lara  DOB: 1975-07-29  PCP: Georganna Skeans, MD ZOX:[id number]  DOA: 06/07/2022  LOS: 1 day  Hospital Day: 2  Brief narrative: Christopher Lara is a 47 y.o. male with PMH significant for SIADH and hyponatremia with COVID in Jan 2021 with recurrence in February 2024 complicated by pericardial effusion.   04/12/2022-diagnosed with COVID, completed course of Paxlovid but continued to feel fatigued 2/21-presented with rhabdomyolysis, low sodium level of 105, admitted to ICU, started on 3% normal saline with subsequent improvement in sodium level close to normal. Also found to have large pericardial effusion requiring pericardiocentesis on 2/22 with clinical improvement.  Fluid analysis showed transudate. 3/27 most recent outpatient echo obtained by outpatient cardiology showed normal EF at recurrence of pericardial effusion, not in tamponade.  Because of absence of symptoms, no intervention was done at the time. 4/11, patient presented to the ED with complaint chest pain, hypertension, bradycardia  In the ED, patient was afebrile, heart rate in 60s, blood pressure 140s, breathing on room air Labs showed BUN/creatinine at 10/1.43, CK level elevated to 1700 Bedside echo showed large effusion, normal RV size and function. Admitted to [facility] Currently consulted.  Subjective: Patient was seen and examined this morning.  Pleasant middle-aged Caucasian male.  Lying on bed.  Wife at bedside. History confirmed as above.  Not in pain at this time. Overnight remains hemodynamically stable. Most recent labs from last night showed creatinine 1.35, CRP mildly elevated 1.3  Assessment and plan: Recurrent pericardial effusion Presented with chest pain, bradycardia, hypertension  Echo with recurrence of pericardial effusion  Cardiology consulted cardiac surgery for pericardial window placement.   Troponin normal ESR and CRP elevated and hence colchicine 0.6.  He was  started.  Hypercalcemia Chronically elevated CK Persistently elevated calcium level and CK level raise along with elevated ESR, CRP suspicion of some myositis or other rheumatological process.   Obtain LDH Recent Labs    04/19/22 0257 04/19/22 2007 04/20/22 0949 04/21/22 0331 04/30/22 1120 06/05/22 1619 06/07/22 1403 06/08/22 0058  CALCIUM 8.9  --  10.2 10.0 10.6* 11.2* 10.7* 10.6*  PHOS 1.5* 1.6* 2.0*  --   --   --   --   --    No results found for: "PTH"   Recent Labs  Lab 06/05/22 1619 06/07/22 2126  CKTOTAL 1,458* 1,764*     AKI Mild. Continue to monitor Recent Labs    04/18/22 0430 04/19/22 0257 04/20/22 0949 04/21/22 0331 04/30/22 1120 06/05/22 1619 06/07/22 1403 06/08/22 0058  BUN 6 5* CREATININE 0.84 1.01 1.01 0.91 1.24 1.37* 1.43* 1.35*   Mobility: Encourage ambulation  Goals of care   Code Status: Full Code     DVT prophylaxis:  SCDs Start: 06/07/22 2122   Antimicrobials: None Fluid: None Consultants: Cardiology, cardiothoracic surgery Family Communication: Wife at bedside  Status: Inpatient Level of care:  Telemetry Cardiac   Needs to continue in-hospital care:  Likely needs pericardial window  Patient from: Home Anticipated d/c to: Pending clinical course      Diet:  Diet Order             Diet NPO time specified Except for: Sips with Meds  Diet effective midnight                   Scheduled Meds:  colchicine  0.6 mg Oral BID    PRN meds: acetaminophen **OR** acetaminophen, ondansetron **OR** ondansetron (ZOFRAN) IV  Infusions:    Antimicrobials: Anti-infectives (From admission, onward)    None       Nutritional status:  Body mass index is 26.46 kg/m.          Objective: Vitals:   06/08/22 0451 06/08/22 0725  BP: 120/81 115/79  Pulse: 63 66  Resp: 14 17  Temp: (!) 97.4 F (36.3 C) 97.7 F (36.5 C)  SpO2: 98% 98%    Intake/Output Summary (Last 24 hours) at 06/08/2022  1202 Last data filed at 06/08/2022 0457 Gross per 24 hour  Intake 0 ml  Output 600 ml  Net -600 ml   Filed Weights   06/08/22 0011  Weight: 78.9 kg   Weight change:  Body mass index is 26.46 kg/m.   Physical Exam: General exam: Pleasant, middle-aged Caucasian male Skin: No rashes, lesions or ulcers. HEENT: Atraumatic, normocephalic, no obvious bleeding Lungs: Clear to auscultation bilaterally CVS: Regular rate and rhythm, no murmur.  GI/Abd soft, nontender, nondistended, bowel sound present CNS: Alert, awake, oriented x 3 Psychiatry: Sad affect Extremities: No pedal edema, no calf tenderness  Data Review: I have personally reviewed the laboratory data and studies available.  F/u labs ordered Unresulted Labs (From admission, onward)     Start     Ordered   06/09/22 0500  CK  Tomorrow morning,   R       Question:  Specimen collection method  Answer:  Lab=Lab collect   06/08/22 1003   06/07/22 2124  Parathyroid hormone, intact (no Ca)  Once,   R        06/07/22 2124   06/07/22 2124  PTH-related peptide  Once,   R        06/07/22 2124            Total time spent in review of labs and imaging, patient evaluation, formulation of plan, documentation and communication with family: 55 minutes  Signed, Lorin Glass, MD [facility] 06/08/2022

## 2022-06-09 DIAGNOSIS — I3139 Other pericardial effusion (noninflammatory): Secondary | ICD-10-CM | POA: Diagnosis not present

## 2022-06-09 LAB — CK: Total CK: 1171 U/L — ABNORMAL HIGH (ref 49–397)

## 2022-06-09 NOTE — Progress Notes (Signed)
Rounding Note    Patient Name: Christopher Lara Date of Encounter: 06/09/2022  [facility] Cardiologist: Peter Swaziland, MD   Subjective   Patient resting comfortably in bed this morning. No CP. Did have diarrhea with initiation of colchicine, can decrease dose if needed.  Inpatient Medications    Scheduled Meds:  colchicine  0.6 mg Oral BID   Continuous Infusions:  PRN Meds: acetaminophen **OR** acetaminophen, ondansetron **OR** ondansetron (ZOFRAN) IV   Vital Signs    Vitals:   06/09/22 0016 06/09/22 0433 06/09/22 0743 06/09/22 1146  BP: 113/70 118/81 120/84 113/82  Pulse: 61 67 60 64  Resp: Temp: (!) 97.5 F (36.4 C) (!) 97.4 F (36.3 C) 97.6 F (36.4 C) 97.6 F (36.4 C)  TempSrc: Oral Oral Oral Oral  SpO2: 95% 95% 98% 96%  Weight:  78.3 kg    Height:        Intake/Output Summary (Last 24 hours) at 06/09/2022 1537 Last data filed at 06/09/2022 0437 Gross per 24 hour  Intake 360 ml  Output 500 ml  Net -140 ml      06/09/2022    4:33 AM 06/08/2022   12:11 AM 06/05/2022    3:17 PM  Last 3 Weights  Weight (lbs) 172 lb 11.2 oz 174 lb 176 lb 12.8 oz  Weight (kg) 78.336 kg 78.926 kg 80.196 kg      Telemetry    Sinus rhythm/sinus bradycardia - Personally Reviewed  ECG    No new tracing - Personally Reviewed  Physical Exam   GEN: No acute distress.   Neck: No JVD Cardiac: RRR, no murmurs, rubs, or gallops.  Respiratory: Clear to auscultation bilaterally. GI: Soft, nontender, non-distended  MS: No edema; No deformity. Neuro:  Nonfocal  Psych: Normal affect   Labs    High Sensitivity Troponin:   Recent Labs  Lab 06/07/22 1403 06/07/22 1722  TROPONINIHS 3 3     Chemistry Recent Labs  Lab 06/05/22 1619 06/07/22 1403 06/08/22 0058  NA 142 138 136  K 4.4 4.0 3.9  CL 106 104 104  CO2 GLUCOSE 81 84 105*  BUN CREATININE 1.37* 1.43* 1.35*  CALCIUM 11.2* 10.7* 10.6*  PROT  --   --  6.8  ALBUMIN   --   --  4.0  AST  --   --  66*  ALT  --   --  23  ALKPHOS  --   --  39  BILITOT  --   --  0.7  GFRNONAA  --  >60 >60  ANIONGAP  --  7 6    Lipids No results for input(s): "CHOL", "TRIG", "HDL", "LABVLDL", "LDLCALC", "CHOLHDL" in the last 168 hours.  Hematology Recent Labs  Lab 06/05/22 1619 06/07/22 1403 06/08/22 0058  WBC 4.2 4.1 4.2  RBC 3.95* 3.75* 3.65*  HGB 13.0 12.4* 12.0*  HCT 38.2 36.4* 35.2*  MCV 97 97.1 96.4  MCH 32.9 33.1 32.9  MCHC 34.0 34.1 34.1  RDW 14.1 13.4 13.2  PLT 237 226 227   Thyroid No results for input(s): "TSH", "FREET4" in the last 168 hours.  BNP Recent Labs  Lab 06/07/22 1429  BNP 34.3    DDimer No results for input(s): "DDIMER" in the last 168 hours.   Radiology    ECHOCARDIOGRAM LIMITED  Result Date: 06/08/2022    ECHOCARDIOGRAM LIMITED REPORT   Patient Name:   Christopher Lara  Date of Exam: 06/08/2022 Medical Rec #:  [id number]      Height:       68.0 in Accession #:    [id number]     Weight:       174.0 lb Date of Birth:  [birth year]      BSA:          1.926 m Patient Age:    46 years       BP:           115/79 mmHg Patient Gender: M              HR:           62 bpm. Exam Location:  Inpatient Procedure: Limited Echo, Color Doppler and Cardiac Doppler Indications:    I31.3 Pericardial effusion  History:        Patient has prior history of Echocardiogram examinations, most                 recent 05/23/2022.  Sonographer:    Irving Burton Senior RDCS Referring Phys: [id number] Parke Poisson  Sonographer Comments: Limited for pericardial effusion IMPRESSIONS  1. Large pericardial effusion. The pericardial effusion is circumferential. Conclusion(s)/Recommendation(s): Large pericardial effusion with RV collapse during diastole, borderline + inflow velocities; however the IVC collapses. Borderline echo features for tamponade. Discussed with primary team. FINDINGS  Pericardium: A large pericardial effusion is present. The pericardial effusion is circumferential.  Additional Comments: Spectral Doppler performed. Color Doppler performed.  Carolan Clines Electronically signed by Carolan Clines Signature Date/Time: 06/08/2022/12:00:59 PM    Final    CT CHEST ABDOMEN PELVIS WO CONTRAST  Result Date: 06/07/2022 CLINICAL DATA:  Pericardial effusion hypercalcemia left-sided chest pain EXAM: CT CHEST, ABDOMEN AND PELVIS WITHOUT CONTRAST TECHNIQUE: Multidetector CT imaging of the chest, abdomen and pelvis was performed following the standard protocol without IV contrast. RADIATION DOSE REDUCTION: This exam was performed according to the departmental dose-optimization program which includes automated exposure control, adjustment of the mA and/or kV according to patient size and/or use of iterative reconstruction technique. COMPARISON:  Chest x-ray 06/07/2022 FINDINGS: CT CHEST FINDINGS Cardiovascular: Limited evaluation without intravenous contrast. Nonaneurysmal aorta. Mild atherosclerosis. Large circumferential pericardial effusion measuring up to 2.5 cm. Mediastinum/Nodes: Midline trachea. No thyroid mass. No suspicious lymph nodes. Esophagus within normal limits. Lungs/Pleura: No acute airspace disease or effusion. Mild subpleural scarring. Musculoskeletal: No chest wall mass or suspicious bone lesions identified. CT ABDOMEN PELVIS FINDINGS Hepatobiliary: No focal liver abnormality is seen. No gallstones, gallbladder wall thickening, or biliary dilatation. Pancreas: Unremarkable. No pancreatic ductal dilatation or surrounding inflammatory changes. Spleen: Normal in size without focal abnormality. Adrenals/Urinary Tract: Adrenal glands are normal. 5 mm left kidney stone. Urinary bladder is unremarkable. Kidneys show no hydronephrosis Stomach/Bowel: Stomach nonenlarged. No dilated small bowel. No acute bowel wall thickening. Negative appendix. Vascular/Lymphatic: Nonaneurysmal aorta.  No suspicious lymph nodes Reproductive: Negative prostate. Other: Negative for free air or free fluid.  Musculoskeletal: Chronic compression deformity at T10 IMPRESSION: 1. Large circumferential pericardial effusion measuring up to 2.5 cm. 2. No CT evidence for acute intra-abdominal or pelvic abnormality. 3. Nonobstructing left kidney stone. Electronically Signed   By: Jasmine Pang M.D.   On: 06/07/2022 23:33    Cardiac Studies    Patient Profile     Christopher Lara is a 47 y.o. male with a hx of recurrent pericardial effusion, syncope, hyponatremia, and anemia who is being seen for the evaluation of chest pain at the request of Dr. Lockie Mola.  Assessment & Plan    Recurrent pericardial effusion  Patient presented to the ED hypertensive and bradycardic with waxing and waning chest pain and a recent echocardiogram as noted above with large pericardial effusion. Dr. Jacques Navy performed bedside echocardiogram and found LVEF normal at 60-65%, normal RV size/function. Large pericardial effusion that is circumferential with swinging cardiac motion. No evidence of RV diastolic collapse.   TCTS to perform percardial window early next week.  ESR and CRP elevated, colchicine 0.6mg  BID, can reduce to 0.3 mg BID if diarrhea continues otherwise continue on routine dosing.  Remains hemodynamically stable no clinical tamponade.      For questions or updates, please contact [facility] Please consult [email or web address] for contact info under        Signed, Parke Poisson, MD  06/09/2022, 3:37 PM

## 2022-06-09 NOTE — Progress Notes (Signed)
PROGRESS NOTE  Christopher Lara  DOB: 1975-12-29  PCP: Georganna Skeans, MD TML:[id number]  DOA: 06/07/2022  LOS: 2 days  Hospital Day: 3  Brief narrative: Christopher Lara is a 47 y.o. male with PMH significant for SIADH and hyponatremia with COVID in Jan 2021 with recurrence in February 2024 complicated by pericardial effusion.   04/12/2022-diagnosed with COVID, completed course of Paxlovid but continued to feel fatigued 2/21-presented with rhabdomyolysis, low sodium level of 105, admitted to ICU, started on 3% normal saline with subsequent improvement in sodium level close to normal. Also found to have large pericardial effusion requiring pericardiocentesis on 2/22 with clinical improvement.  Fluid analysis showed transudate. 3/27 most recent outpatient echo obtained by outpatient cardiology showed normal EF at recurrence of pericardial effusion, not in tamponade.  Because of absence of symptoms, no intervention was done at the time. 4/11, patient presented to the ED with complaint chest pain, hypertension, bradycardia  In the ED, patient was afebrile, heart rate in 60s, blood pressure 140s, breathing on room air Labs showed BUN/creatinine at 10/1.43, CK level elevated to 1700 Bedside echo showed large effusion, normal RV size and function. Admitted to [facility] Currently consulted.  Subjective: Patient was seen and examined this morning.  Pleasant middle-aged Caucasian male.  Lying on bed.  CTS plans for pericardial window on Monday  Assessment and plan: Recurrent pericardial effusion Presented with chest pain, bradycardia, hypertension  Echo with recurrence of pericardial effusion  Cardiology consulted Troponin normal ESR and CRP elevated and hence colchicine 0.6.  He was started. Window on MOnday  Hypercalcemia Chronically elevated CK Persistently elevated calcium level and CK level raise along with elevated ESR, CRP suspicion of some myositis or other rheumatological process.   Obtain  LDH Recent Labs    04/19/22 0257 04/19/22 2007 04/20/22 0949 04/21/22 0331 04/30/22 1120 06/05/22 1619 06/07/22 1403 06/08/22 0058  CALCIUM 8.9  --  10.2 10.0 10.6* 11.2* 10.7* 10.6*  PHOS 1.5* 1.6* 2.0*  --   --   --   --   --    No results found for: "PTH"   Recent Labs  Lab 06/05/22 1619 06/07/22 2126 06/09/22 0036  CKTOTAL 1,458* 1,764* 1,171*     AKI Mild. Continue to monitor Recent Labs    04/18/22 0430 04/19/22 0257 04/20/22 0949 04/21/22 0331 04/30/22 1120 06/05/22 1619 06/07/22 1403 06/08/22 0058  BUN 6 5* 8 10 10 11 10 11   CREATININE 0.84 1.01 1.01 0.91 1.24 1.37* 1.43* 1.35*   Mobility: Encourage ambulation  Goals of care   Code Status: Full Code     DVT prophylaxis:  SCDs Start: 06/07/22 2122   Antimicrobials: None Fluid: None Consultants: Cardiology, cardiothoracic surgery Family Communication: Wife at bedside  Status: Inpatient Level of care:  Telemetry Cardiac   Needs to continue in-hospital care:  Likely needs pericardial window  Patient from: Home Anticipated d/c to: Pending clinical course      Diet:  Diet Order             Diet regular Room service appropriate? Yes; Fluid consistency: Thin  Diet effective now                   Scheduled Meds:  colchicine  0.6 mg Oral BID    PRN meds: acetaminophen **OR** acetaminophen, ondansetron **OR** ondansetron (ZOFRAN) IV   Infusions:    Antimicrobials: Anti-infectives (From admission, onward)    None       Nutritional status:  Body mass  index is 26.26 kg/m.          Objective: Vitals:   06/09/22 0433 06/09/22 0743  BP: 118/81 120/84  Pulse: 67 60  Resp: 17 11  Temp: (!) 97.4 F (36.3 C) 97.6 F (36.4 C)  SpO2: 95% 98%    Intake/Output Summary (Last 24 hours) at 06/09/2022 1123 Last data filed at 06/09/2022 0437 Gross per 24 hour  Intake 600 ml  Output 500 ml  Net 100 ml    Filed Weights   06/08/22 0011 06/09/22 0433  Weight: 78.9 kg  78.3 kg   Weight change: -0.59 kg Body mass index is 26.26 kg/m.   Physical Exam: General exam: Pleasant, middle-aged Caucasian male Skin: No rashes, lesions or ulcers. HEENT: Atraumatic, normocephalic, no obvious bleeding Lungs: Clear to auscultation bilaterally CVS: Regular rate and rhythm, no murmur.  GI/Abd soft, nontender, nondistended, bowel sound present CNS: Alert, awake, oriented x 3 Psychiatry: Sad affect Extremities: No pedal edema, no calf tenderness  Data Review: I have personally reviewed the laboratory data and studies available.  F/u labs ordered Unresulted Labs (From admission, onward)     Start     Ordered   06/07/22 2124  Parathyroid hormone, intact (no Ca)  Once,   R        06/07/22 2124   06/07/22 2124  PTH-related peptide  Once,   R        06/07/22 2124            Total time spent in review of labs and imaging, patient evaluation, formulation of plan, documentation and communication with family: 55 minutes  Signed, Alan Mulder, MD [facility] 06/09/2022

## 2022-06-09 NOTE — Progress Notes (Signed)
     [address]       [place] [address]             [phone]       Stable, no complaints Will plan for window on Monday Will need CT chest w/o contrast  Tanayah Squitieri O Dashiell Franchino

## 2022-06-10 DIAGNOSIS — I3139 Other pericardial effusion (noninflammatory): Secondary | ICD-10-CM | POA: Diagnosis not present

## 2022-06-10 LAB — PARATHYROID HORMONE, INTACT (NO CA): PTH: 76 pg/mL — ABNORMAL HIGH (ref 15–65)

## 2022-06-10 NOTE — Progress Notes (Signed)
Rounding Note    Patient Name: Christopher Lara Date of Encounter: 06/10/2022  [facility] Cardiologist: Peter Swaziland, MD   Subjective   Patient resting comfortably in bed this morning. No CP. Did have diarrhea with first dose of colchicine, however, no recurrence of diarrhea since.  Inpatient Medications    Scheduled Meds:  colchicine  0.6 mg Oral BID   Continuous Infusions:  PRN Meds: acetaminophen **OR** acetaminophen, ondansetron **OR** ondansetron (ZOFRAN) IV   Vital Signs    Vitals:   06/10/22 0440 06/10/22 0600 06/10/22 0726 06/10/22 1139  BP:   (!) 125/91   Pulse: (!) 57  (!) 55   Resp: Temp:   (!) 97.5 F (36.4 C) 97.7 F (36.5 C)  TempSrc:   Oral Oral  SpO2: 98%  98%   Weight:      Height:        Intake/Output Summary (Last 24 hours) at 06/10/2022 1207 Last data filed at 06/10/2022 1109 Gross per 24 hour  Intake 840 ml  Output 1250 ml  Net -410 ml      06/10/2022    4:38 AM 06/09/2022    4:33 AM 06/08/2022   12:11 AM  Last 3 Weights  Weight (lbs) 173 lb 3.2 oz 172 lb 11.2 oz 174 lb  Weight (kg) 78.563 kg 78.336 kg 78.926 kg      Telemetry    Sinus rhythm/sinus bradycardia - Personally Reviewed  ECG    No new tracing - Personally Reviewed  Physical Exam   GEN: No acute distress.   Neck: No JVD Cardiac: RRR, no murmurs, rubs, or gallops.  Respiratory: Clear to auscultation bilaterally. GI: Soft, nontender, non-distended  MS: No edema; No deformity. Neuro:  Nonfocal  Psych: Normal affect   Labs    High Sensitivity Troponin:   Recent Labs  Lab 06/07/22 1403 06/07/22 1722  TROPONINIHS 3 3     Chemistry Recent Labs  Lab 06/05/22 1619 06/07/22 1403 06/08/22 0058  NA 142 138 136  K 4.4 4.0 3.9  CL 106 104 104  CO2 GLUCOSE 81 84 105*  BUN CREATININE 1.37* 1.43* 1.35*  CALCIUM 11.2* 10.7* 10.6*  PROT  --   --  6.8  ALBUMIN  --   --  4.0  AST  --   --  66*  ALT  --   --  23   ALKPHOS  --   --  39  BILITOT  --   --  0.7  GFRNONAA  --  >60 >60  ANIONGAP  --  7 6    Lipids No results for input(s): "CHOL", "TRIG", "HDL", "LABVLDL", "LDLCALC", "CHOLHDL" in the last 168 hours.  Hematology Recent Labs  Lab 06/05/22 1619 06/07/22 1403 06/08/22 0058  WBC 4.2 4.1 4.2  RBC 3.95* 3.75* 3.65*  HGB 13.0 12.4* 12.0*  HCT 38.2 36.4* 35.2*  MCV 97 97.1 96.4  MCH 32.9 33.1 32.9  MCHC 34.0 34.1 34.1  RDW 14.1 13.4 13.2  PLT 237 226 227   Thyroid No results for input(s): "TSH", "FREET4" in the last 168 hours.  BNP Recent Labs  Lab 06/07/22 1429  BNP 34.3    DDimer No results for input(s): "DDIMER" in the last 168 hours.   Radiology    No results found.  Cardiac Studies    Patient Profile     DEJAUN VIDRIO is a 47 y.o. male with  a hx of recurrent pericardial effusion, syncope, hyponatremia, and anemia who is being seen for the evaluation of chest pain at the request of Dr. Lockie Mola.  Assessment & Plan    Recurrent pericardial effusion  Patient presented to the ED hypertensive and bradycardic with waxing and waning chest pain and a recent echocardiogram as noted above with large pericardial effusion. Dr. Jacques Navy performed bedside echocardiogram and found LVEF normal at 60-65%, normal RV size/function. Large pericardial effusion that is circumferential with swinging cardiac motion. No evidence of RV diastolic collapse on my independent review.   TCTS to perform percardial window Monday. ESR and CRP elevated, colchicine 0.6mg  BID tolerating well. Anticipate 3 months of colchicine unless directed otherwise by TCTS. Remains hemodynamically stable no clinical tamponade.      For questions or updates, please contact [facility] Please consult [email or web address] for contact info under        Signed, Parke Poisson, MD  06/10/2022, 12:07 PM

## 2022-06-10 NOTE — Progress Notes (Signed)
PROGRESS NOTE  Christopher Lara  DOB: 24-May-1975  PCP: Georganna Skeans, MD ZOX:[id number]  DOA: 06/07/2022  LOS: 3 days  Hospital Day: 4  Brief narrative: Christopher Lara is a 47 y.o. male with PMH significant for SIADH and hyponatremia with COVID in Jan 2021 with recurrence in February 2024 complicated by pericardial effusion.   04/12/2022-diagnosed with COVID, completed course of Paxlovid but continued to feel fatigued 2/21-presented with rhabdomyolysis, low sodium level of 105, admitted to ICU, started on 3% normal saline with subsequent improvement in sodium level close to normal. Also found to have large pericardial effusion requiring pericardiocentesis on 2/22 with clinical improvement.  Fluid analysis showed transudate. 3/27 most recent outpatient echo obtained by outpatient cardiology showed normal EF at recurrence of pericardial effusion, not in tamponade.  Because of absence of symptoms, no intervention was done at the time. 4/11, patient presented to the ED with complaint chest pain, hypertension, bradycardia  In the ED, patient was afebrile, heart rate in 60s, blood pressure 140s, breathing on room air Labs showed BUN/creatinine at 10/1.43, CK level elevated to 1700 Bedside echo showed large effusion, normal RV size and function. Admitted to [facility] Currently consulted.  Subjective: Patient was seen and examined this morning.  Pleasant middle-aged Caucasian male.  Lying on bed.  CTS plans for pericardial window on Monday. Tolerating colchicine. Will make NPOI at [facility] and check labs in morning.   Assessment and plan: Recurrent pericardial effusion Presented with chest pain, bradycardia, hypertension  Echo with recurrence of pericardial effusion  Cardiology consulted Troponin normal ESR and CRP elevated and hence colchicine 0.6.  He was started. Pericardial window tomorrow. Check labs in morning.   Hypercalcemia Chronically elevated CK Persistently elevated calcium level and CK  level raise along with elevated ESR, CRP suspicion of some myositis or other rheumatological process.   OP rheumatologic referral \ Recent Labs    04/19/22 0257 04/19/22 2007 04/20/22 0949 04/21/22 0331 04/30/22 1120 06/05/22 1619 06/07/22 1403 06/08/22 0058  CALCIUM 8.9  --  10.2 10.0 10.6* 11.2* 10.7* 10.6*  PHOS 1.5* 1.6* 2.0*  --   --   --   --   --   PTH  --   --   --   --   --   --  76*  --        Component Value Date/Time   PTH 76 (H) 06/07/2022 1403     Recent Labs  Lab 06/05/22 1619 06/07/22 2126 06/09/22 0036  CKTOTAL 1,458* 1,764* 1,171*      AKI Mild. Continue to monitor Recent Labs    04/18/22 0430 04/19/22 0257 04/20/22 0949 04/21/22 0331 04/30/22 1120 06/05/22 1619 06/07/22 1403 06/08/22 0058  BUN 6 5* CREATININE 0.84 1.01 1.01 0.91 1.24 1.37* 1.43* 1.35*    Mobility: Encourage ambulation  Goals of care   Code Status: Full Code     DVT prophylaxis:  SCDs Start: 06/07/22 2122   Antimicrobials: None Fluid: None Consultants: Cardiology, cardiothoracic surgery Family Communication:   Status: Inpatient Level of care:  Telemetry Cardiac   Needs to continue in-hospital care:  Likely needs pericardial window  Patient from: Home Anticipated d/c to: Pending clinical course      Diet:  Diet Order             Diet NPO time specified  Diet effective midnight           Diet regular Room service appropriate? Yes;  Fluid consistency: Thin  Diet effective now                   Scheduled Meds:  colchicine  0.6 mg Oral BID    PRN meds: acetaminophen **OR** acetaminophen, ondansetron **OR** ondansetron (ZOFRAN) IV   Infusions:    Antimicrobials: Anti-infectives (From admission, onward)    None       Nutritional status:  Body mass index is 26.33 kg/m.          Objective: Vitals:   06/10/22 0726 06/10/22 1139  BP: (!) 125/91   Pulse: (!) 55   Resp: 12 18  Temp: (!) 97.5 F (36.4 C)  97.7 F (36.5 C)  SpO2: 98%     Intake/Output Summary (Last 24 hours) at 06/10/2022 1248 Last data filed at 06/10/2022 1109 Gross per 24 hour  Intake 840 ml  Output 1250 ml  Net -410 ml    Filed Weights   06/08/22 0011 06/09/22 0433 06/10/22 0438  Weight: 78.9 kg 78.3 kg 78.6 kg   Weight change: 0.227 kg Body mass index is 26.33 kg/m.   Physical Exam: General exam: Pleasant, middle-aged Caucasian male Skin: No rashes, lesions or ulcers. HEENT: Atraumatic, normocephalic, no obvious bleeding Lungs: Clear to auscultation bilaterally CVS: Regular rate and rhythm, no murmur.  GI/Abd soft, nontender, nondistended, bowel sound present CNS: Alert, awake, oriented x 3 Psychiatry: normal affect Extremities: No pedal edema, no calf tenderness  Data Review: I have personally reviewed the laboratory data and studies available.  F/u labs ordered Unresulted Labs (From admission, onward)     Start     Ordered   06/11/22 0500  CBC  Tomorrow morning,   R       Question:  Specimen collection method  Answer:  Lab=Lab collect   06/10/22 1247   06/11/22 0500  Comprehensive metabolic panel  Tomorrow morning,   R       Question:  Specimen collection method  Answer:  Lab=Lab collect   06/10/22 1247   06/11/22 0500  Magnesium  Tomorrow morning,   R       Question:  Specimen collection method  Answer:  Lab=Lab collect   06/10/22 1247   06/11/22 0500  C-reactive protein  Tomorrow morning,   R       Question:  Specimen collection method  Answer:  Lab=Lab collect   06/10/22 1247   06/07/22 2124  PTH-related peptide  Once,   R        06/07/22 2124            Total time spent in review of labs and imaging, patient evaluation, formulation of plan, documentation and communication with family: 55 minutes  Signed, Alan Mulder, MD [facility] 06/10/2022

## 2022-06-11 ENCOUNTER — Other Ambulatory Visit: Payer: Self-pay

## 2022-06-11 ENCOUNTER — Encounter (HOSPITAL_COMMUNITY)
Admission: EM | Disposition: A | Discharge status: Home / Self Care | Payer: Self-pay | Source: Home / Self Care | Attending: Internal Medicine

## 2022-06-11 ENCOUNTER — Inpatient Hospital Stay (HOSPITAL_COMMUNITY): Payer: Federal, State, Local not specified - PPO | Admitting: Certified Registered"

## 2022-06-11 ENCOUNTER — Inpatient Hospital Stay (HOSPITAL_COMMUNITY): Payer: Federal, State, Local not specified - PPO

## 2022-06-11 ENCOUNTER — Encounter (HOSPITAL_COMMUNITY): Payer: Self-pay | Admitting: Internal Medicine

## 2022-06-11 DIAGNOSIS — I318 Other specified diseases of pericardium: Secondary | ICD-10-CM | POA: Diagnosis not present

## 2022-06-11 DIAGNOSIS — J90 Pleural effusion, not elsewhere classified: Secondary | ICD-10-CM | POA: Diagnosis not present

## 2022-06-11 DIAGNOSIS — Z9889 Other specified postprocedural states: Secondary | ICD-10-CM | POA: Diagnosis present

## 2022-06-11 DIAGNOSIS — N289 Disorder of kidney and ureter, unspecified: Secondary | ICD-10-CM | POA: Diagnosis not present

## 2022-06-11 DIAGNOSIS — I3139 Other pericardial effusion (noninflammatory): Secondary | ICD-10-CM | POA: Diagnosis not present

## 2022-06-11 DIAGNOSIS — J9811 Atelectasis: Secondary | ICD-10-CM | POA: Diagnosis not present

## 2022-06-11 DIAGNOSIS — D649 Anemia, unspecified: Secondary | ICD-10-CM | POA: Diagnosis not present

## 2022-06-11 HISTORY — PX: XI ROBOTIC ASSISTED PERICARDIAL WINDOW: SHX6870

## 2022-06-11 LAB — C-REACTIVE PROTEIN: CRP: 4.8 mg/dL — ABNORMAL HIGH (ref <1.0)

## 2022-06-11 LAB — COMPREHENSIVE METABOLIC PANEL
ALT: 28 U/L (ref 0–44)
AST: 66 U/L — ABNORMAL HIGH (ref 15–41)
Albumin: 3.8 g/dL (ref 3.5–5.0)
Alkaline Phosphatase: 40 U/L (ref 38–126)
Anion gap: 7 (ref 5–15)
BUN: 16 mg/dL (ref 6–20)
CO2: 26 mmol/L (ref 22–32)
Calcium: 10.4 mg/dL — ABNORMAL HIGH (ref 8.9–10.3)
Chloride: 98 mmol/L (ref 98–111)
Creatinine, Ser: 1.6 mg/dL — ABNORMAL HIGH (ref 0.61–1.24)
GFR, Estimated: 53 mL/min — ABNORMAL LOW (ref >60)
Glucose, Bld: 93 mg/dL (ref 70–99)
Potassium: 3.7 mmol/L (ref 3.5–5.1)
Sodium: 131 mmol/L — ABNORMAL LOW (ref 135–145)
Total Bilirubin: 0.8 mg/dL (ref 0.3–1.2)
Total Protein: 6.7 g/dL (ref 6.5–8.1)

## 2022-06-11 LAB — CBC
HCT: 34.2 % — ABNORMAL LOW (ref 39.0–52.0)
Hemoglobin: 11.8 g/dL — ABNORMAL LOW (ref 13.0–17.0)
MCH: 32.8 pg (ref 26.0–34.0)
MCHC: 34.5 g/dL (ref 30.0–36.0)
MCV: 95 fL (ref 80.0–100.0)
Platelets: 219 10*3/uL (ref 150–400)
RBC: 3.6 MIL/uL — ABNORMAL LOW (ref 4.22–5.81)
RDW: 13.2 % (ref 11.5–15.5)
WBC: 4.3 10*3/uL (ref 4.0–10.5)
nRBC: 0 % (ref 0.0–0.2)

## 2022-06-11 LAB — SARS CORONAVIRUS 2 BY RT PCR: SARS Coronavirus 2 by RT PCR: NEGATIVE

## 2022-06-11 LAB — MAGNESIUM: Magnesium: 2.3 mg/dL (ref 1.7–2.4)

## 2022-06-11 SURGERY — CREATION, PERICARDIAL WINDOW, ROBOT-ASSISTED
Anesthesia: General | Laterality: Right

## 2022-06-11 MED ORDER — DEXAMETHASONE SODIUM PHOSPHATE 10 MG/ML IJ SOLN
INTRAMUSCULAR | Status: DC | PRN
  Administered 2022-06-11: 10 mg via INTRAVENOUS

## 2022-06-11 MED ORDER — LIDOCAINE 2% (20 MG/ML) 5 ML SYRINGE
INTRAMUSCULAR | Status: AC
  Filled 2022-06-11: qty 5

## 2022-06-11 MED ORDER — PHENYLEPHRINE 80 MCG/ML (10ML) SYRINGE FOR IV PUSH (FOR BLOOD PRESSURE SUPPORT)
PREFILLED_SYRINGE | INTRAVENOUS | Status: AC
  Filled 2022-06-11: qty 20

## 2022-06-11 MED ORDER — CHLORHEXIDINE GLUCONATE 0.12 % MT SOLN: 15.0000 mL | Freq: Once | OROMUCOSAL | Status: AC

## 2022-06-11 MED ORDER — ORAL CARE MOUTH RINSE: 15.0000 mL | Freq: Once | OROMUCOSAL | Status: AC

## 2022-06-11 MED ORDER — SUGAMMADEX SODIUM 200 MG/2ML IV SOLN
INTRAVENOUS | Status: DC | PRN
  Administered 2022-06-11: 400 mg via INTRAVENOUS

## 2022-06-11 MED ORDER — FENTANYL CITRATE (PF) 250 MCG/5ML IJ SOLN
INTRAMUSCULAR | Status: AC
  Filled 2022-06-11: qty 5

## 2022-06-11 MED ORDER — BUPIVACAINE HCL (PF) 0.5 % IJ SOLN
INTRAMUSCULAR | Status: AC
  Filled 2022-06-11: qty 30

## 2022-06-11 MED ORDER — LIDOCAINE 2% (20 MG/ML) 5 ML SYRINGE
INTRAMUSCULAR | Status: DC | PRN
  Administered 2022-06-11: 100 mg via INTRAVENOUS

## 2022-06-11 MED ORDER — ENOXAPARIN SODIUM 40 MG/0.4ML IJ SOSY
40.0000 mg | PREFILLED_SYRINGE | Freq: Every day | INTRAMUSCULAR | Status: DC
  Administered 2022-06-12 – 2022-06-15 (×4): 40 mg via SUBCUTANEOUS
  Filled 2022-06-11 (×4): qty 0.4

## 2022-06-11 MED ORDER — TRAMADOL HCL 50 MG PO TABS
50.0000 mg | ORAL_TABLET | Freq: Four times a day (QID) | ORAL | Status: DC | PRN
  Administered 2022-06-12: 50 mg via ORAL
  Filled 2022-06-11: qty 1

## 2022-06-11 MED ORDER — ONDANSETRON HCL 4 MG/2ML IJ SOLN
INTRAMUSCULAR | Status: DC | PRN
  Administered 2022-06-11: 4 mg via INTRAVENOUS

## 2022-06-11 MED ORDER — FENTANYL CITRATE (PF) 100 MCG/2ML IJ SOLN: 25.0000 ug | INTRAMUSCULAR | Status: DC | PRN

## 2022-06-11 MED ORDER — CEFAZOLIN SODIUM-DEXTROSE 2-3 GM-%(50ML) IV SOLR
INTRAVENOUS | Status: DC | PRN
  Administered 2022-06-11: 2 g via INTRAVENOUS

## 2022-06-11 MED ORDER — FENTANYL CITRATE PF 50 MCG/ML IJ SOSY: 25.0000 ug | PREFILLED_SYRINGE | INTRAMUSCULAR | Status: DC | PRN

## 2022-06-11 MED ORDER — PHENYLEPHRINE 80 MCG/ML (10ML) SYRINGE FOR IV PUSH (FOR BLOOD PRESSURE SUPPORT)
PREFILLED_SYRINGE | INTRAVENOUS | Status: DC | PRN
  Administered 2022-06-11: 160 ug via INTRAVENOUS
  Administered 2022-06-11: 200 ug via INTRAVENOUS

## 2022-06-11 MED ORDER — ONDANSETRON HCL 4 MG/2ML IJ SOLN
INTRAMUSCULAR | Status: AC
  Filled 2022-06-11: qty 2

## 2022-06-11 MED ORDER — ROCURONIUM BROMIDE 10 MG/ML (PF) SYRINGE
PREFILLED_SYRINGE | INTRAVENOUS | Status: AC
  Filled 2022-06-11: qty 10

## 2022-06-11 MED ORDER — FENTANYL CITRATE (PF) 250 MCG/5ML IJ SOLN
INTRAMUSCULAR | Status: DC | PRN
  Administered 2022-06-11: 100 ug via INTRAVENOUS

## 2022-06-11 MED ORDER — SENNOSIDES-DOCUSATE SODIUM 8.6-50 MG PO TABS
1.0000 | ORAL_TABLET | Freq: Every day | ORAL | Status: DC
  Administered 2022-06-11 – 2022-06-13 (×3): 1 via ORAL
  Filled 2022-06-11 (×3): qty 1

## 2022-06-11 MED ORDER — DEXAMETHASONE SODIUM PHOSPHATE 10 MG/ML IJ SOLN
INTRAMUSCULAR | Status: AC
  Filled 2022-06-11: qty 1

## 2022-06-11 MED ORDER — ONDANSETRON HCL 4 MG/2ML IJ SOLN
INTRAMUSCULAR | Status: AC
  Filled 2022-06-11: qty 4

## 2022-06-11 MED ORDER — BUPIVACAINE LIPOSOME 1.3 % IJ SUSP
INTRAMUSCULAR | Status: DC | PRN
  Administered 2022-06-11: 50 mL

## 2022-06-11 MED ORDER — PROPOFOL 10 MG/ML IV BOLUS
INTRAVENOUS | Status: DC | PRN
  Administered 2022-06-11: 40 mg via INTRAVENOUS
  Administered 2022-06-11: 30 mg via INTRAVENOUS

## 2022-06-11 MED ORDER — BUPIVACAINE LIPOSOME 1.3 % IJ SUSP
INTRAMUSCULAR | Status: AC
  Filled 2022-06-11: qty 20

## 2022-06-11 MED ORDER — ACETAMINOPHEN 500 MG PO TABS
1000.0000 mg | ORAL_TABLET | Freq: Four times a day (QID) | ORAL | Status: DC
  Administered 2022-06-11 – 2022-06-15 (×13): 1000 mg via ORAL
  Filled 2022-06-11 (×13): qty 2

## 2022-06-11 MED ORDER — MIDAZOLAM HCL 2 MG/2ML IJ SOLN
INTRAMUSCULAR | Status: DC | PRN
  Administered 2022-06-11: 2 mg via INTRAVENOUS

## 2022-06-11 MED ORDER — ROCURONIUM BROMIDE 10 MG/ML (PF) SYRINGE
PREFILLED_SYRINGE | INTRAVENOUS | Status: DC | PRN
  Administered 2022-06-11: 70 mg via INTRAVENOUS

## 2022-06-11 MED ORDER — DEXMEDETOMIDINE HCL IN NACL 80 MCG/20ML IV SOLN
INTRAVENOUS | Status: DC | PRN
  Administered 2022-06-11: 8 ug via BUCCAL

## 2022-06-11 MED ORDER — DEXAMETHASONE SODIUM PHOSPHATE 10 MG/ML IJ SOLN
INTRAMUSCULAR | Status: AC
  Filled 2022-06-11: qty 2

## 2022-06-11 MED ORDER — ETOMIDATE 2 MG/ML IV SOLN
INTRAVENOUS | Status: DC | PRN
  Administered 2022-06-11: 20 mg via INTRAVENOUS

## 2022-06-11 MED ORDER — ACETAMINOPHEN 500 MG PO TABS
1000.0000 mg | ORAL_TABLET | Freq: Once | ORAL | Status: AC
  Administered 2022-06-11: 1000 mg via ORAL
  Filled 2022-06-11: qty 2

## 2022-06-11 MED ORDER — MIDAZOLAM HCL 2 MG/2ML IJ SOLN
INTRAMUSCULAR | Status: AC
  Filled 2022-06-11: qty 2

## 2022-06-11 MED ORDER — BISACODYL 5 MG PO TBEC
10.0000 mg | DELAYED_RELEASE_TABLET | Freq: Every day | ORAL | Status: DC
  Administered 2022-06-11 – 2022-06-14 (×2): 10 mg via ORAL
  Filled 2022-06-11 (×3): qty 2

## 2022-06-11 MED ORDER — PROMETHAZINE HCL 25 MG/ML IJ SOLN: 6.2500 mg | INTRAMUSCULAR | Status: DC | PRN

## 2022-06-11 MED ORDER — LIDOCAINE 2% (20 MG/ML) 5 ML SYRINGE
INTRAMUSCULAR | Status: AC
  Filled 2022-06-11: qty 10

## 2022-06-11 MED ORDER — PANTOPRAZOLE SODIUM 40 MG PO TBEC
40.0000 mg | DELAYED_RELEASE_TABLET | Freq: Every day | ORAL | Status: DC
  Administered 2022-06-12 – 2022-06-15 (×4): 40 mg via ORAL
  Filled 2022-06-11 (×4): qty 1

## 2022-06-11 MED ORDER — 0.9 % SODIUM CHLORIDE (POUR BTL) OPTIME
TOPICAL | Status: DC | PRN
  Administered 2022-06-11: 2000 mL

## 2022-06-11 MED ORDER — CEFAZOLIN SODIUM-DEXTROSE 2-4 GM/100ML-% IV SOLN
2.0000 g | Freq: Three times a day (TID) | INTRAVENOUS | Status: AC
  Administered 2022-06-11 – 2022-06-12 (×2): 2 g via INTRAVENOUS
  Filled 2022-06-11 (×2): qty 100

## 2022-06-11 MED ORDER — ACETAMINOPHEN 160 MG/5ML PO SOLN: 1000.0000 mg | Freq: Four times a day (QID) | ORAL | Status: DC

## 2022-06-11 MED ORDER — CHLORHEXIDINE GLUCONATE 0.12 % MT SOLN
OROMUCOSAL | Status: AC
  Administered 2022-06-11: 15 mL via OROMUCOSAL
  Filled 2022-06-11: qty 15

## 2022-06-11 MED ORDER — PROPOFOL 10 MG/ML IV BOLUS
INTRAVENOUS | Status: AC
  Filled 2022-06-11: qty 20

## 2022-06-11 MED ORDER — LACTATED RINGERS IV SOLN: INTRAVENOUS | Status: DC

## 2022-06-11 MED ORDER — AMISULPRIDE (ANTIEMETIC) 5 MG/2ML IV SOLN: 10.0000 mg | Freq: Once | INTRAVENOUS | Status: DC | PRN

## 2022-06-11 SURGICAL SUPPLY — 54 items
BLADE STERNUM SYSTEM 6 (BLADE) IMPLANT
CAUTERY SPATULA MNPLR 1.7 DVNC (INSTRUMENTS) ×1 IMPLANT
DEFOGGER SCOPE WARMER CLEARIFY (MISCELLANEOUS) ×1 IMPLANT
DERMABOND ADVANCED .7 DNX12 (GAUZE/BANDAGES/DRESSINGS) ×1 IMPLANT
DRAIN CHANNEL 19F RND (DRAIN) IMPLANT
DRAPE ARM DVNC X/XI (DISPOSABLE) ×4 IMPLANT
DRAPE COLUMN DVNC XI (DISPOSABLE) ×1 IMPLANT
DRAPE CV SPLIT W-CLR ANES SCRN (DRAPES) ×1 IMPLANT
DRAPE ORTHO SPLIT 77X108 STRL (DRAPES) ×1
DRAPE SURG ORHT 6 SPLT 77X108 (DRAPES) ×1 IMPLANT
ELECT REM PT RETURN 9FT ADLT (ELECTROSURGICAL)
ELECTRODE REM PT RTRN 9FT ADLT (ELECTROSURGICAL) IMPLANT
EVACUATOR SILICONE 100CC (DRAIN) IMPLANT
FELT TEFLON 1X6 (MISCELLANEOUS) IMPLANT
FORCEPS BPLR LNG DVNC XI (INSTRUMENTS) ×1 IMPLANT
FORCEPS CADIERE DVNC XI (FORCEP) ×1 IMPLANT
GAUZE 4X4 16PLY ~~LOC~~+RFID DBL (SPONGE) IMPLANT
GAUZE SPONGE 4X4 12PLY STRL (GAUZE/BANDAGES/DRESSINGS) IMPLANT
GLOVE BIO SURGEON STRL SZ7 (GLOVE) ×2 IMPLANT
GLOVE SS BIOGEL STRL SZ 7.5 (GLOVE) IMPLANT
GOWN STRL REUS W/ TWL LRG LVL3 (GOWN DISPOSABLE) ×1 IMPLANT
GOWN STRL REUS W/ TWL XL LVL3 (GOWN DISPOSABLE) ×2 IMPLANT
GOWN STRL REUS W/TWL 2XL LVL3 (GOWN DISPOSABLE) ×1 IMPLANT
GOWN STRL REUS W/TWL LRG LVL3 (GOWN DISPOSABLE) ×1
GOWN STRL REUS W/TWL XL LVL3 (GOWN DISPOSABLE) ×2
GRASPER TIP-UP FEN DVNC XI (INSTRUMENTS) ×1 IMPLANT
NEEDLE HYPO 22GX1.5 SAFETY (NEEDLE) ×1 IMPLANT
PACK CHEST (CUSTOM PROCEDURE TRAY) ×1 IMPLANT
PAD ARMBOARD 7.5X6 YLW CONV (MISCELLANEOUS) ×2 IMPLANT
PAD ELECT DEFIB RADIOL ZOLL (MISCELLANEOUS) ×1 IMPLANT
SEAL CANN UNIV 5-8 DVNC XI (MISCELLANEOUS) ×3 IMPLANT
SET TRI-LUMEN FLTR TB AIRSEAL (TUBING) ×1 IMPLANT
SPONGE T-LAP 18X18 ~~LOC~~+RFID (SPONGE) ×1 IMPLANT
STOPCOCK 4 WAY LG BORE MALE ST (IV SETS) IMPLANT
SUT MNCRL AB 3-0 PS2 18 (SUTURE) IMPLANT
SUT PDS AB 1 CTX 36 (SUTURE) IMPLANT
SUT SILK  1 MH (SUTURE) ×1
SUT SILK 1 MH (SUTURE) ×1 IMPLANT
SUT SILK 2 0 SH CR/8 (SUTURE) IMPLANT
SUT STEEL 6MS V (SUTURE) IMPLANT
SUT STEEL SZ 6 DBL 3X14 BALL (SUTURE) IMPLANT
SUT VIC AB 2-0 CT1 27 (SUTURE) ×1
SUT VIC AB 2-0 CT1 TAPERPNT 27 (SUTURE) IMPLANT
SUT VIC AB 2-0 CTX 36 (SUTURE) IMPLANT
SUT VIC AB 3-0 SH 27 (SUTURE) ×1
SUT VIC AB 3-0 SH 27X BRD (SUTURE) ×1 IMPLANT
SUT VICRYL 0 UR6 27IN ABS (SUTURE) ×2 IMPLANT
SYR 50ML LL SCALE MARK (SYRINGE) IMPLANT
SYSTEM SAHARA CHEST DRAIN ATS (WOUND CARE) IMPLANT
TAPE CLOTH SURG 4X10 WHT LF (GAUZE/BANDAGES/DRESSINGS) IMPLANT
TOWEL GREEN STERILE (TOWEL DISPOSABLE) IMPLANT
TRAP SPECIMEN MUCUS 40CC (MISCELLANEOUS) ×2 IMPLANT
TRAY FOLEY SLVR 16FR TEMP STAT (SET/KITS/TRAYS/PACK) ×1 IMPLANT
TUBING EXTENTION W/L.L. (IV SETS) IMPLANT

## 2022-06-11 NOTE — Brief Op Note (Signed)
06/07/2022 - 06/11/2022  6:35 PM  PATIENT:  Christopher Lara  47 y.o. male  PRE-OPERATIVE DIAGNOSIS:  pericardial effusion  POST-OPERATIVE DIAGNOSIS:  PERICARDIAL EFFUSION  PROCEDURE:  Procedure(s): XI ROBOTIC ASSISTED THORACOSCOPY PERICARDIAL WINDOW (Right)  SURGEON:  Surgeon(s) and Role:    * Lightfoot, Eliezer Lofts, MD - Primary  PHYSICIAN ASSISTANT: Aedon Deason PA-C  ASSISTANTS: none   ANESTHESIA:   general  EBL: 10 CC  BLOOD ADMINISTERED:none  DRAINS: (1 ) Blake drain(s) in the PERICARDIUM    LOCAL MEDICATIONS USED:  BUPIVICAINE  and OTHER EXPAREL  SPECIMEN:  Source of Specimen:  PERICARDIUM  DISPOSITION OF SPECIMEN:  PATHOLOGY  COUNTS:  YES  TOURNIQUET:  * No tourniquets in log *  DICTATION: .Dragon Dictation  PLAN OF CARE: Admit to inpatient   PATIENT DISPOSITION:  PACU - hemodynamically stable.   Delay start of Pharmacological VTE agent (>24hrs) due to surgical blood loss or risk of bleeding: yes  COMPLICATIONS: NO KNOWN

## 2022-06-11 NOTE — Anesthesia Preprocedure Evaluation (Signed)
Anesthesia Evaluation  Patient identified by MRN, date of birth, ID band Patient awake    Reviewed: Allergy & Precautions, NPO status , Patient's Chart, lab work & pertinent test results  History of Anesthesia Complications Negative for: history of anesthetic complications  Airway Mallampati: III   Neck ROM: Full    Dental no notable dental hx.    Pulmonary neg pulmonary ROS   Pulmonary exam normal        Cardiovascular negative cardio ROS Normal cardiovascular exam  Hx of recurrent pericardial effusion post Covid.  Large Pericardial effusion with some evidence of tampanade  TTE 05/23/22 IMPRESSIONS      1. Left ventricular ejection fraction, by estimation, is 60 to 65%. The left ventricle has normal function. The left ventricle has no regional wall motion abnormalities.  2. Large pericardial effusion. The pericardial effusion is circumferential. There is no evidence of cardiac tamponade.  3. The aortic valve is tricuspid. Aortic valve regurgitation is not visualized. No aortic stenosis is present.  4. The inferior vena cava is normal in size with greater than 50% respiratory variability, suggesting right atrial pressure of 3 mmHg.   Comparison(s): Changes from prior study are noted. Recurrence of pericardial effusion, last echo with trivial effusion post pericardiocentesis.   Conclusion(s)/Recommendation(s): Large pericardial effusion, maximum size 2.68 cm adjacent to RA. [facility] DOD Dr. Lynnette Caffey notified by sonographer, patient clinically stable. No evidence of tamponade by echo.      Neuro/Psych negative neurological ROS     GI/Hepatic negative GI ROS, Neg liver ROS,,,  Endo/Other  negative endocrine ROS    Renal/GU Renal InsufficiencyRenal disease     Musculoskeletal negative musculoskeletal ROS (+)    Abdominal   Peds  Hematology  (+) Blood dyscrasia, anemia   Anesthesia Other Findings    Reproductive/Obstetrics                             Anesthesia Physical Anesthesia Plan  ASA: 3  Anesthesia Plan: General   Post-op Pain Management: Tylenol PO (pre-op)* and Minimal or no pain anticipated   Induction: Intravenous  PONV Risk Score and Plan: 2 and Ondansetron and Dexamethasone  Airway Management Planned: Oral ETT  Additional Equipment: Arterial line  Intra-op Plan:   Post-operative Plan: Extubation in OR  Informed Consent: I have reviewed the patients History and Physical, chart, labs and discussed the procedure including the risks, benefits and alternatives for the proposed anesthesia with the patient or authorized representative who has indicated his/her understanding and acceptance.     Dental advisory given  Plan Discussed with: Anesthesiologist, CRNA and Surgeon  Anesthesia Plan Comments:         Anesthesia Quick Evaluation

## 2022-06-11 NOTE — Transfer of Care (Signed)
Immediate Anesthesia Transfer of Care Note  Patient: Christopher Lara  Procedure(s) Performed: XI ROBOTIC ASSISTED THORACOSCOPY PERICARDIAL WINDOW (Right)  Patient Location: PACU  Anesthesia Type:General  Level of Consciousness: drowsy  Airway & Oxygen Therapy: Patient Spontanous Breathing and Patient connected to face mask oxygen  Post-op Assessment: Report given to RN and Post -op Vital signs reviewed and stable  Post vital signs: Reviewed and stable  Last Vitals:  Vitals Value Taken Time  BP 138/87 06/11/22 1921  Temp    Pulse 67 06/11/22 1925  Resp 24 06/11/22 1925  SpO2 94 % 06/11/22 1925  Vitals shown include unvalidated device data.  Last Pain:  Vitals:   06/11/22 1619  TempSrc: Oral  PainSc: 0-No pain         Complications: No notable events documented.

## 2022-06-11 NOTE — Anesthesia Postprocedure Evaluation (Signed)
Anesthesia Post Note  Patient: Christopher Lara  Procedure(s) Performed: XI ROBOTIC ASSISTED THORACOSCOPY PERICARDIAL WINDOW (Right)     Patient location during evaluation: PACU Anesthesia Type: General Level of consciousness: awake Pain management: pain level controlled Vital Signs Assessment: post-procedure vital signs reviewed and stable Respiratory status: spontaneous breathing, nonlabored ventilation and respiratory function stable Cardiovascular status: blood pressure returned to baseline and stable Postop Assessment: no apparent nausea or vomiting Anesthetic complications: no   No notable events documented.  Last Vitals:  Vitals:   06/11/22 2005 06/11/22 2020  BP: (!) 134/101 (!) 122/94  Pulse: 71 69  Resp: 16 16  Temp:  (!) 36.3 C  SpO2: 92% 92%    Last Pain:  Vitals:   06/11/22 2020  TempSrc:   PainSc: 0-No pain                 Yaiza Palazzola P Florella Mcneese

## 2022-06-11 NOTE — Anesthesia Procedure Notes (Signed)
Arterial Line Insertion Start/End4/15/2024 4:52 PM, 06/10/2022 4:52 PM Performed by: Dorie Rank, CRNA, CRNA  Preanesthetic checklist: patient identified, IV checked, site marked, risks and benefits discussed, surgical consent, monitors and equipment checked, pre-op evaluation and timeout performed Lidocaine 1% used for infiltration radial was placed Catheter size: 20 G Hand hygiene performed , maximum sterile barriers used  and Seldinger technique used Allen's test indicative of satisfactory collateral circulation Attempts: 1 Procedure performed without using ultrasound guided technique. Following insertion, Biopatch and dressing applied. Post procedure assessment: normal  Patient tolerated the procedure well with no immediate complications.

## 2022-06-11 NOTE — Op Note (Signed)
      [address]       [name] [address]             [phone]        06/11/2022  Patient:  Christopher Lara Pre-Op Dx: Recurrent Pericardial effusion   Post-op Dx:  same Procedure: - Robotic assisted right video thoracoscopy - Pericardial window - Intercostal nerve block  Surgeon and Role:      * Jayleena Stille, Eliezer Lofts, MD - Primary    Webb Laws, PA-C - assisting  Anesthesia  general EBL:  41ml Blood Administration: none Specimen:  pericardium  Drains: 19 F chest tube in right chest Counts: correct   Indications: he patient is a 47 year old male who has a history of previous pericardial effusion with pericardiocentesis in February 2024 by Dr. Clifton James. Additionally, he has a history significant for SIADH and hyponatremia with COVID in January 2021. He had a recurrence of these as well as rhabdomyolysis with a repeat COVID infection in January 2024. It was at that time that he was noted to have this pericardial effusion. 1 L of fluid was removed at that time on 04/19/2022. Initial fluid analysis was consistent with transudative effusion. Echocardiogram at that time revealed EF of 65 to 70%. A repeat limited echo in March showed EF 60 to 65% with large pericardial effusion but no evidence of tamponade. He presented to the emergency department on 06/07/2022 with hypertension and bradycardia and chest pain an echocardiogram again showed a large pericardial effusion. We are asked to see the patient for consideration of pericardial window.   Findings: Serous pericardial fluid  Operative Technique: After the risks, benefits and alternatives were thoroughly discussed, the patient was brought to the operative theatre.  Anesthesia was induced, and the patient was then placed in a left lazy lateral decubitus position and was prepped and draped in normal sterile fashion.  An appropriate surgical pause was performed, and pre-operative antibiotics were dosed accordingly.  We  began by placing our 3 robotic ports in the the intercostal spaces targeting the pericardium.  A 58mm assistant port was placed in the 9th intercostal space in the posterior axillary line.  The robot was then docked and all instruments were passed under direct visualization.    The pericardium was visualized, and a 3X3 centimeters pericardial window was created using Bovie cautery.  The pericardium was normal appearing.  Immediate release of serous pericardial fluid was released.  The fluid and pericardium were sent for specimen.  A 19 [place] Blake drain was passed through right inferior robotic port into the pericardium.  An intercostal nerve block was performed under direct visualization.  The skin and soft tissue were closed with absorbable suture    The patient tolerated the procedure without any immediate complications, and was transferred to the PACU in stable condition.  Shantel Helwig Keane Scrape

## 2022-06-11 NOTE — Progress Notes (Signed)
     [address]       [place] [address]             [phone]       No events Vitals:   06/11/22 1129 06/11/22 1619  BP: (!) 124/95 (!) 148/97  Pulse: (!) 58 67  Resp: 18 18  Temp: 97.8 F (36.6 C) 97.8 F (36.6 C)  SpO2: 94% 97%   Alert NAD Sinus brady EWOB  OR today for R RATS, pericardial window.  Lashann Hagg Keane Scrape

## 2022-06-11 NOTE — Progress Notes (Signed)
Rounding Note    Patient Name: Christopher Lara Date of Encounter: 06/11/2022  [facility] Cardiologist: Peter Swaziland, MD   Subjective   Denies any chest pain.  No further diarrhea since the initial episode after first dose of colchicine  Inpatient Medications    Scheduled Meds:  colchicine  0.6 mg Oral BID   Continuous Infusions:  PRN Meds: acetaminophen **OR** acetaminophen, ondansetron **OR** ondansetron (ZOFRAN) IV   Vital Signs    Vitals:   06/11/22 0051 06/11/22 0340 06/11/22 0423 06/11/22 0824  BP: 115/78 115/77  119/85  Pulse: (!) 59 (!) 55  (!) 59  Resp: 17 17  18   Temp: 97.6 F (36.4 C) (!) 97.5 F (36.4 C)  97.6 F (36.4 C)  TempSrc: Oral Oral  Oral  SpO2: 97% 97%  100%  Weight:   79 kg   Height:        Intake/Output Summary (Last 24 hours) at 06/11/2022 0908 Last data filed at 06/11/2022 0341 Gross per 24 hour  Intake 360 ml  Output 350 ml  Net 10 ml       06/11/2022    4:23 AM 06/10/2022    4:38 AM 06/09/2022    4:33 AM  Last 3 Weights  Weight (lbs) 174 lb 2.6 oz 173 lb 3.2 oz 172 lb 11.2 oz  Weight (kg) 79 kg 78.563 kg 78.336 kg      Telemetry    Sinus rhythm- Personally Reviewed  ECG    No new tracing - Personally Reviewed  Physical Exam   GEN: Well nourished, well developed in no acute distress HEENT: Normal NECK: No JVD; No carotid bruits LYMPHATICS: No lymphadenopathy CARDIAC:RRR, no murmurs, rubs, gallops RESPIRATORY:  Clear to auscultation without rales, wheezing or rhonchi  ABDOMEN: Soft, non-tender, non-distended MUSCULOSKELETAL:  No edema; No deformity  SKIN: Warm and dry NEUROLOGIC:  Alert and oriented x 3 PSYCHIATRIC:  Normal affect  Labs    High Sensitivity Troponin:   Recent Labs  Lab 06/07/22 1403 06/07/22 1722  TROPONINIHS 3 3      Chemistry Recent Labs  Lab 06/07/22 1403 06/08/22 0058 06/11/22 0120  NA 138 136 131*  K 4.0 3.9 3.7  CL 104 104 98  CO2 27 26 26   GLUCOSE 84 105* 93  BUN  10 11 16   CREATININE 1.43* 1.35* 1.60*  CALCIUM 10.7* 10.6* 10.4*  MG  --   --  2.3  PROT  --  6.8 6.7  ALBUMIN  --  4.0 3.8  AST  --  66* 66*  ALT  --  23 28  ALKPHOS  --  39 40  BILITOT  --  0.7 0.8  GFRNONAA >60 >60 53*  ANIONGAP 7 6 7      Lipids No results for input(s): "CHOL", "TRIG", "HDL", "LABVLDL", "LDLCALC", "CHOLHDL" in the last 168 hours.  Hematology Recent Labs  Lab 06/07/22 1403 06/08/22 0058 06/11/22 0120  WBC 4.1 4.2 4.3  RBC 3.75* 3.65* 3.60*  HGB 12.4* 12.0* 11.8*  HCT 36.4* 35.2* 34.2*  MCV 97.1 96.4 95.0  MCH 33.1 32.9 32.8  MCHC 34.1 34.1 34.5  RDW 13.4 13.2 13.2  PLT 226 227 219    Thyroid No results for input(s): "TSH", "FREET4" in the last 168 hours.  BNP Recent Labs  Lab 06/07/22 1429  BNP 34.3     DDimer No results for input(s): "DDIMER" in the last 168 hours.   Radiology    No results found.  Cardiac  Studies    Patient Profile     CRAVEN CREAN is a 47 y.o. male with a hx of recurrent pericardial effusion, syncope, hyponatremia, and anemia who is being seen for the evaluation of chest pain at the request of Dr. Lockie Mola.  Assessment & Plan    Recurrent pericardial effusion  Patient presented to the ED hypertensive and bradycardic with waxing and waning chest pain and a recent echocardiogram as noted above with large pericardial effusion. Dr. Jacques Navy performed bedside echocardiogram and found LVEF normal at 60-65%, normal RV size/function. Large pericardial effusion that is circumferential with swinging cardiac motion. No evidence of RV diastolic collapse on my independent review.   S/P pericardiocentesis 04/19/22 with removal of 1000 cc of straw-colored fluid.  No drain left in place Now Back with recurrent large pericardial effusion ESR elevated at 23 and CRP elevated at 4.8  Started on colchicine 0.6 mg twice daily although he had diarrhea after the first dose that has resolved Anticipate 3 months of colchicine unless directed  otherwise by TCTS. He is asymptomatic at this time and is hemodynamically stable Pericardial window today by CVTS      For questions or updates, please contact [facility] Please consult [email or web address] for contact info under        Signed, Armanda Magic, MD  06/11/2022, 9:08 AM

## 2022-06-11 NOTE — Anesthesia Procedure Notes (Signed)
Procedure Name: Intubation Date/Time: 06/11/2022 5:56 PM  Performed by: Heather Roberts, MDPre-anesthesia Checklist: Patient identified, Emergency Drugs available, Suction available and Patient being monitored Patient Re-evaluated:Patient Re-evaluated prior to induction Oxygen Delivery Method: Circle system utilized Preoxygenation: Pre-oxygenation with 100% oxygen Induction Type: IV induction Ventilation: Mask ventilation without difficulty Laryngoscope Size: Miller and 2 Grade View: Grade II Tube type: Oral Endobronchial tube: Left and Double lumen EBT and 39 Fr Number of attempts: 1 Airway Equipment and Method: Rigid stylet Placement Confirmation: ETT inserted through vocal cords under direct vision, positive ETCO2 and breath sounds checked- equal and bilateral Tube secured with: Tape Dental Injury: Teeth and Oropharynx as per pre-operative assessment

## 2022-06-11 NOTE — Progress Notes (Signed)
PROGRESS NOTE  [name]  DOB: [birth year]  PCP: Georganna Skeans, MD VWU:[id number]  DOA: 06/07/2022  LOS: 4 days  Hospital Day: 5  Brief narrative: Christopher Lara is a 47 y.o. male with PMH significant for SIADH and hyponatremia with COVID in Jan 2021 with recurrence in February 2024 complicated by pericardial effusion.   04/12/2022-diagnosed with COVID, completed course of Paxlovid but continued to feel fatigued 2/21-presented with rhabdomyolysis, low sodium level of 105, admitted to ICU, started on 3% normal saline with subsequent improvement in sodium level close to normal. Also found to have large pericardial effusion requiring pericardiocentesis on 2/22 with clinical improvement.  Fluid analysis showed transudate. 3/27 most recent outpatient echo obtained by outpatient cardiology showed normal EF at recurrence of pericardial effusion, not in tamponade.  Because of absence of symptoms, no intervention was done at the time. 4/11, patient presented to the ED with complaint chest pain, hypertension, bradycardia  In the ED, patient was afebrile, heart rate in 60s, blood pressure 140s, breathing on room air Labs showed BUN/creatinine at 10/1.43, CK level elevated to 1700 Bedside echo showed large effusion, normal RV size and function. Admitted to [facility] Currently consulted.  Subjective: Patient was seen and examined this morning.  Patient has no new complaints.  Denies having any chest pain shortness of breath.  Today CVTS planning for pericardial window.  Assessment and plan: Recurrent pericardial effusion Presented with chest pain, bradycardia, hypertension  Echo with recurrence of pericardial effusion  Cardiology consulted Troponin normal ESR and CRP elevated and hence colchicine 0.6.  He was started. Pericardial window today.  Patient has been hemodynamically stable.  Hypercalcemia Chronically elevated CK Persistently elevated calcium level and CK level raise along with  elevated ESR, CRP suspicion of some myositis or other rheumatological process.   OP rheumatologic referral Current calcium level is 10.4 which is stable. Recent Labs    04/19/22 0257 04/19/22 2007 04/20/22 0949 04/21/22 0331 04/30/22 1120 06/05/22 1619 06/07/22 1403 06/08/22 0058 06/11/22 0120  CALCIUM 8.9  --  10.2   < > 10.6* 11.2* 10.7* 10.6* 10.4*  PHOS 1.5* 1.6* 2.0*  --   --   --   --   --   --   PTH  --   --   --   --   --   --  76*  --   --    < > = values in this interval not displayed.       Component Value Date/Time   PTH 76 (H) 06/07/2022 1403     Recent Labs  Lab 06/05/22 1619 06/07/22 2126 06/09/22 0036  CKTOTAL 1,458* 1,764* 1,171*      AKI Mild. Continue to monitor Creatinine is mildly trended  up. Recent Labs    04/18/22 0430 04/19/22 0257 04/20/22 0949 04/21/22 0331 04/30/22 1120 06/05/22 1619 06/07/22 1403 06/08/22 0058 06/11/22 0120  BUN 6 5* 8 10 10 11 10 11 16   CREATININE 0.84 1.01 1.01 0.91 1.24 1.37* 1.43* 1.35* 1.60*    Mobility: Encourage ambulation  Goals of care   Code Status: Full Code     DVT prophylaxis:  SCDs Start: 06/07/22 2122   Antimicrobials: None Fluid: None Consultants: Cardiology, cardiothoracic surgery Family Communication:   Status: Inpatient Level of care:  Telemetry Cardiac   Needs to continue in-hospital care:  Likely needs pericardial window  Patient from: Home Anticipated d/c to: Pending clinical course      Diet:  Diet Order  Diet NPO time specified  Diet effective midnight                   Scheduled Meds:  colchicine  0.6 mg Oral BID    PRN meds: acetaminophen **OR** acetaminophen, ondansetron **OR** ondansetron (ZOFRAN) IV   Infusions:    Antimicrobials: Anti-infectives (From admission, onward)    None       Nutritional status:  Body mass index is 26.48 kg/m.          Objective: Vitals:   06/11/22 0824 06/11/22 1129  BP: 119/85 (!)  124/95  Pulse: (!) 59 (!) 58  Resp: 18 18  Temp: 97.6 F (36.4 C) 97.8 F (36.6 C)  SpO2: 100% 94%    Intake/Output Summary (Last 24 hours) at 06/11/2022 1408 Last data filed at 06/11/2022 1100 Gross per 24 hour  Intake 170 ml  Output 650 ml  Net -480 ml    Filed Weights   06/09/22 0433 06/10/22 0438 06/11/22 0423  Weight: 78.3 kg 78.6 kg 79 kg   Weight change: 0.437 kg Body mass index is 26.48 kg/m.   Physical Exam: General exam: Pleasant, middle-aged Caucasian male Skin: No rashes, lesions or ulcers. HEENT: Atraumatic, normocephalic, no obvious bleeding Lungs: Clear to auscultation bilaterally CVS: Regular rate and rhythm, no murmur.  GI/Abd soft, nontender, nondistended, bowel sound present CNS: Alert, awake, oriented x 3 Psychiatry: normal affect Extremities: No pedal edema, no calf tenderness  Data Review: I have personally reviewed the laboratory data and studies available.  F/u labs ordered Unresulted Labs (From admission, onward)     Start     Ordered   06/07/22 2124  PTH-related peptide  Once,   R        06/07/22 2124            Total time spent in review of labs and imaging, patient evaluation, formulation of plan, documentation and communication with family: 55 minutes  Signed, Harold Hedge, MD [facility] 06/11/2022

## 2022-06-12 ENCOUNTER — Inpatient Hospital Stay (HOSPITAL_COMMUNITY): Payer: Federal, State, Local not specified - PPO

## 2022-06-12 ENCOUNTER — Encounter (HOSPITAL_COMMUNITY): Payer: Self-pay | Admitting: Thoracic Surgery (Cardiothoracic Vascular Surgery)

## 2022-06-12 DIAGNOSIS — J9811 Atelectasis: Secondary | ICD-10-CM | POA: Diagnosis not present

## 2022-06-12 DIAGNOSIS — I3139 Other pericardial effusion (noninflammatory): Secondary | ICD-10-CM | POA: Diagnosis not present

## 2022-06-12 LAB — COMPREHENSIVE METABOLIC PANEL
ALT: 30 U/L (ref 0–44)
AST: 79 U/L — ABNORMAL HIGH (ref 15–41)
Albumin: 4.3 g/dL (ref 3.5–5.0)
Alkaline Phosphatase: 43 U/L (ref 38–126)
Anion gap: 12 (ref 5–15)
BUN: 15 mg/dL (ref 6–20)
CO2: 20 mmol/L — ABNORMAL LOW (ref 22–32)
Calcium: 10.5 mg/dL — ABNORMAL HIGH (ref 8.9–10.3)
Chloride: 99 mmol/L (ref 98–111)
Creatinine, Ser: 1.32 mg/dL — ABNORMAL HIGH (ref 0.61–1.24)
GFR, Estimated: >60 mL/min (ref >60)
Glucose, Bld: 118 mg/dL — ABNORMAL HIGH (ref 70–99)
Potassium: 4.3 mmol/L (ref 3.5–5.1)
Sodium: 131 mmol/L — ABNORMAL LOW (ref 135–145)
Total Bilirubin: 0.7 mg/dL (ref 0.3–1.2)
Total Protein: 7.4 g/dL (ref 6.5–8.1)

## 2022-06-12 LAB — CBC WITH DIFFERENTIAL/PLATELET
Abs Immature Granulocytes: 0.02 10*3/uL (ref 0.00–0.07)
Basophils Absolute: 0 10*3/uL (ref 0.0–0.1)
Basophils Relative: 0 %
Eosinophils Absolute: 0 10*3/uL (ref 0.0–0.5)
Eosinophils Relative: 0 %
HCT: 40.5 % (ref 39.0–52.0)
Hemoglobin: 14.1 g/dL (ref 13.0–17.0)
Immature Granulocytes: 0 %
Lymphocytes Relative: 12 %
Lymphs Abs: 0.9 10*3/uL (ref 0.7–4.0)
MCH: 32.6 pg (ref 26.0–34.0)
MCHC: 34.8 g/dL (ref 30.0–36.0)
MCV: 93.8 fL (ref 80.0–100.0)
Monocytes Absolute: 0.1 10*3/uL (ref 0.1–1.0)
Monocytes Relative: 1 %
Neutro Abs: 6.7 10*3/uL (ref 1.7–7.7)
Neutrophils Relative %: 87 %
Platelets: 233 10*3/uL (ref 150–400)
RBC: 4.32 MIL/uL (ref 4.22–5.81)
RDW: 13 % (ref 11.5–15.5)
WBC: 7.7 10*3/uL (ref 4.0–10.5)
nRBC: 0 % (ref 0.0–0.2)

## 2022-06-12 NOTE — Progress Notes (Signed)
Educated pt how to use the IS and its benefits. Pt demonstrated correctly.   Lawson Radar, RN

## 2022-06-12 NOTE — Progress Notes (Signed)
PROGRESS NOTE  Christopher Lara  DOB: 1976/01/07  PCP: Georganna Skeans, MD ZOX:[id number]  DOA: 06/07/2022  LOS: 5 days  Hospital Day: 6  Brief narrative: Christopher Lara is a 47 y.o. male with PMH significant for SIADH and hyponatremia with COVID in Jan 2021 with recurrence in February 2024 complicated by pericardial effusion.   04/12/2022-diagnosed with COVID, completed course of Paxlovid but continued to feel fatigued 2/21-presented with rhabdomyolysis, low sodium level of 105, admitted to ICU, started on 3% normal saline with subsequent improvement in sodium level close to normal. Also found to have large pericardial effusion requiring pericardiocentesis on 2/22 with clinical improvement.  Fluid analysis showed transudate. 3/27 most recent outpatient echo obtained by outpatient cardiology showed normal EF at recurrence of pericardial effusion, not in tamponade.  Because of absence of symptoms, no intervention was done at the time. 4/11, patient presented to the ED with complaint chest pain, hypertension, bradycardia  In the ED, patient was afebrile, heart rate in 60s, blood pressure 140s, breathing on room air Labs showed BUN/creatinine at 10/1.43, CK level elevated to 1700 Bedside echo showed large effusion, normal RV size and function. Admitted to [facility] Currently consulted. Patient admitted with large pericardial effusion without any cardiac tamponade.  Patient has been having recurrent pericardial effusions.  Cardiology, cardiothoracic surgery consulted.  Patient underwent pericardial window yesterday with removal of serosanguineous fluid.  Currently chest tube from the pericardial window is connected to suction.  Subjective: Patient was seen and examined this morning.  Complains of pain in the chest when taking deep breath and unable to take a deep breath due to the chest tube.  Currently on 2 L of oxygen via nasal cannula.  Patient remained pericardial window with chest tube placement and  connected to suction. Assessment and plan: Recurrent pericardial effusion Presented with chest pain, bradycardia, hypertension  Echo with recurrence of pericardial effusion  Cardiology consulted Troponin normal ESR and CRP elevated and hence colchicine 0.6.  He was started. Response pericardial window on 06/11/2022.  Currently chest tube in place connected to suction has 480 ml output.  Having mild discomfort when taking deep breath and is needing 2 L of oxygen via nasal cannula.  Cardiology and thoracic surgery are following.   Hemodynamics continue to be stable, repeat chest x-ray shows improvement in cardiomegaly.  Hypercalcemia Chronically elevated CK Persistently elevated calcium level and CK level raise along with elevated ESR, CRP suspicion of some myositis or other rheumatological process.   OP rheumatologic referral Current calcium level is 10.4 which is stable. Recent Labs    04/19/22 0257 04/19/22 2007 04/20/22 0949 04/21/22 0331 06/05/22 1619 06/07/22 1403 06/08/22 0058 06/11/22 0120 06/12/22 0135  CALCIUM 8.9  --  10.2   < > 11.2* 10.7* 10.6* 10.4* 10.5*  PHOS 1.5* 1.6* 2.0*  --   --   --   --   --   --   PTH  --   --   --   --   --  76*  --   --   --    < > = values in this interval not displayed.       Component Value Date/Time   PTH 76 (H) 06/07/2022 1403     Recent Labs  Lab 06/05/22 1619 06/07/22 2126 06/09/22 0036  CKTOTAL 1,458* 1,764* 1,171*      AKI Mild. Continue to monitor Creatinine is mildly trended  up. Recent Labs    04/18/22 0430 04/19/22 0257 04/20/22 8119  04/21/22 0331 04/30/22 1120 06/05/22 1619 06/07/22 1403 06/08/22 0058 06/11/22 0120 06/12/22 0135  BUN 6 5* CREATININE 0.84 1.01 1.01 0.91 1.24 1.37* 1.43* 1.35* 1.60* 1.32*    Mobility: Encourage ambulation  Goals of care   Code Status: Full Code     DVT prophylaxis:  enoxaparin (LOVENOX) injection 40 mg Start: 06/12/22 0645 SCD's Start:  06/11/22 2103 SCDs Start: 06/07/22 2122   Antimicrobials: None Fluid: None Consultants: Cardiology, cardiothoracic surgery Family Communication:   Status: Inpatient Level of care:  Telemetry Cardiac   Needs to continue in-hospital care:  Likely needs pericardial window  Patient from: Home Anticipated d/c to: Pending clinical course      Diet:  Diet Order             Diet regular Room service appropriate? Yes; Fluid consistency: Thin  Diet effective now                   Scheduled Meds:  acetaminophen  1,000 mg Oral Q6H   Or   acetaminophen (TYLENOL) oral liquid 160 mg/5 mL  1,000 mg Oral Q6H   bisacodyl  10 mg Oral Daily   colchicine  0.6 mg Oral BID   enoxaparin (LOVENOX) injection  40 mg Subcutaneous Daily   pantoprazole  40 mg Oral Daily   senna-docusate  1 tablet Oral QHS    PRN meds: acetaminophen **OR** acetaminophen, fentaNYL (SUBLIMAZE) injection, ondansetron **OR** ondansetron (ZOFRAN) IV, traMADol   Infusions:    Antimicrobials: Anti-infectives (From admission, onward)    Start     Dose/Rate Route Frequency Ordered Stop   06/12/22 0000  ceFAZolin (ANCEF) IVPB 2g/100 mL premix        2 g 200 mL/hr over 30 Minutes Intravenous Every 8 hours 06/11/22 2102 06/12/22 0919       Nutritional status:  Body mass index is 26.48 kg/m.          Objective: Vitals:   06/12/22 0800 06/12/22 1145  BP: 113/86 107/82  Pulse: 74 73  Resp: 15 16  Temp: 97.9 F (36.6 C) 97.8 F (36.6 C)  SpO2: 93% 95%    Intake/Output Summary (Last 24 hours) at 06/12/2022 1455 Last data filed at 06/12/2022 0940 Gross per 24 hour  Intake 500 ml  Output 2230 ml  Net -1730 ml    Filed Weights   06/09/22 0433 06/10/22 0438 06/11/22 0423  Weight: 78.3 kg 78.6 kg 79 kg   Weight change:  Body mass index is 26.48 kg/m.   Physical Exam: General exam: Pleasant, middle-aged Caucasian male Skin: No rashes, lesions or ulcers. HEENT: Atraumatic, normocephalic,  no obvious bleeding Lungs: Clear to auscultation bilaterally CVS: Regular rate and rhythm, no murmur.  GI/Abd soft, nontender, nondistended, bowel sound present CNS: Alert, awake, oriented x 3 Psychiatry: normal affect Extremities: No pedal edema, no calf tenderness  Data Review: I have personally reviewed the laboratory data and studies available.  F/u labs ordered Unresulted Labs (From admission, onward)     Start     Ordered   06/13/22 0500  CBC  Once,   R       Question:  Specimen collection method  Answer:  Lab=Lab collect   06/11/22 2102   06/13/22 0500  Comprehensive metabolic panel  Once,   R       Question:  Specimen collection method  Answer:  Lab=Lab collect   06/11/22 2102   06/13/22 0500  CBC  with Differential/Platelet  Tomorrow morning,   R       Question:  Specimen collection method  Answer:  Lab=Lab collect   06/12/22 0818   06/13/22 0500  Comprehensive metabolic panel  Tomorrow morning,   R       Question:  Specimen collection method  Answer:  Lab=Lab collect   06/12/22 0818   06/12/22 0500  CBC  Tomorrow morning,   R       Question:  Specimen collection method  Answer:  Lab=Lab collect   06/11/22 2102   06/07/22 2124  PTH-related peptide  Once,   R        06/07/22 2124            Total time spent in review of labs and imaging, patient evaluation, formulation of plan, documentation and communication with family: 55 minutes  Signed, Harold Hedge, MD [facility] 06/12/2022

## 2022-06-12 NOTE — Progress Notes (Addendum)
      [address]       Christopher Lara [address]             [phone]        1 Day Post-Op Procedure(s) (LRB): XI ROBOTIC ASSISTED THORACOSCOPY PERICARDIAL WINDOW (Right)  Subjective: Patient is hungry this am. His pain on right (chest tube) is "so so" this am.  Objective: Vital signs in last 24 hours: Temp:  [97.4 F (36.3 C)-97.8 F (36.6 C)] 97.5 F (36.4 C) (04/16 0259) Pulse Rate:  [58-84] 76 (04/16 0259) Cardiac Rhythm: Normal sinus rhythm (04/16 0430) Resp:  [12-19] 14 (04/16 0400) BP: (96-148)/(77-107) 118/87 (04/16 0259) SpO2:  [92 %-100 %] 93 % (04/16 0259) Arterial Line BP: (122-157)/(78-101) 122/78 (04/16 0000)   Current Weight  06/11/22 79 kg      Intake/Output from previous day: 04/15 0701 - 04/16 0700 In: 550 [P.O.:550] Out: 2330 [Urine:1800; Blood:50; Chest Tube:480]   Physical Exam:  Cardiovascular: RRR Pulmonary: Clear to auscultation bilaterally Abdomen: Soft, non tender, bowel sounds present. Extremities: No lower extremity edema. Wounds: Clean and dry.  No erythema or signs of infection. Chest tube: to suction, no air leak  Lab Results: CBC: Recent Labs    06/11/22 0120 06/12/22 0135  WBC 4.3 7.7  HGB 11.8* 14.1  HCT 34.2* 40.5  PLT 219 233   BMET:  Recent Labs    06/11/22 0120 06/12/22 0135  NA 131* 131*  K 3.7 4.3  CL 98 99  CO2 26 20*  GLUCOSE 93 118*  BUN 16 15  CREATININE 1.60* 1.32*  CALCIUM 10.4* 10.5*    PT/INR: No results found for: "INR", "PROTIME" ABG:  INR: Will add last result for INR, ABG once components are confirmed Will add last 4 CBG results once components are confirmed  Assessment/Plan:  1. CV - SR with HR in the 70's. On Colchicine for recurrent pericardial effusion. 2.  Pulmonary - On 2 liters of oxygen via [place]. Wean as able. Right chest tube with 480 cc since surgery. Chest tube is to suction, no air leak. CXR this am appears to show improvement in cardiomegaly, bibasilar  atelectasis. Chest tube to remain for now. Encourage incentive spirometer. Await final pathology. 3. AKI-Creatinine decreased to 1.32 4. Remove a line  Christopher Lara 7:12 AM   Agree with above Continue chest tube drainage for now IS, ambulation  Christopher Lara

## 2022-06-13 ENCOUNTER — Inpatient Hospital Stay (HOSPITAL_COMMUNITY): Payer: Federal, State, Local not specified - PPO

## 2022-06-13 DIAGNOSIS — I3139 Other pericardial effusion (noninflammatory): Secondary | ICD-10-CM | POA: Diagnosis not present

## 2022-06-13 DIAGNOSIS — J90 Pleural effusion, not elsewhere classified: Secondary | ICD-10-CM | POA: Diagnosis not present

## 2022-06-13 LAB — CBC WITH DIFFERENTIAL/PLATELET
Abs Immature Granulocytes: 0.09 10*3/uL — ABNORMAL HIGH (ref 0.00–0.07)
Basophils Absolute: 0 10*3/uL (ref 0.0–0.1)
Basophils Relative: 0 %
Eosinophils Absolute: 0 10*3/uL (ref 0.0–0.5)
Eosinophils Relative: 0 %
HCT: 38.1 % — ABNORMAL LOW (ref 39.0–52.0)
Hemoglobin: 13.3 g/dL (ref 13.0–17.0)
Immature Granulocytes: 1 %
Lymphocytes Relative: 8 %
Lymphs Abs: 1.1 10*3/uL (ref 0.7–4.0)
MCH: 32.7 pg (ref 26.0–34.0)
MCHC: 34.9 g/dL (ref 30.0–36.0)
MCV: 93.6 fL (ref 80.0–100.0)
Monocytes Absolute: 0.5 10*3/uL (ref 0.1–1.0)
Monocytes Relative: 4 %
Neutro Abs: 12.3 10*3/uL — ABNORMAL HIGH (ref 1.7–7.7)
Neutrophils Relative %: 87 %
Platelets: 256 10*3/uL (ref 150–400)
RBC: 4.07 MIL/uL — ABNORMAL LOW (ref 4.22–5.81)
RDW: 13.4 % (ref 11.5–15.5)
WBC: 14.1 10*3/uL — ABNORMAL HIGH (ref 4.0–10.5)
nRBC: 0 % (ref 0.0–0.2)

## 2022-06-13 LAB — COMPREHENSIVE METABOLIC PANEL
ALT: 25 U/L (ref 0–44)
AST: 62 U/L — ABNORMAL HIGH (ref 15–41)
Albumin: 3.6 g/dL (ref 3.5–5.0)
Alkaline Phosphatase: 48 U/L (ref 38–126)
Anion gap: 5 (ref 5–15)
BUN: 21 mg/dL — ABNORMAL HIGH (ref 6–20)
CO2: 21 mmol/L — ABNORMAL LOW (ref 22–32)
Calcium: 10.5 mg/dL — ABNORMAL HIGH (ref 8.9–10.3)
Chloride: 103 mmol/L (ref 98–111)
Creatinine, Ser: 1.76 mg/dL — ABNORMAL HIGH (ref 0.61–1.24)
GFR, Estimated: 47 mL/min — ABNORMAL LOW (ref >60)
Glucose, Bld: 119 mg/dL — ABNORMAL HIGH (ref 70–99)
Potassium: 3.9 mmol/L (ref 3.5–5.1)
Sodium: 129 mmol/L — ABNORMAL LOW (ref 135–145)
Total Bilirubin: 0.4 mg/dL (ref 0.3–1.2)
Total Protein: 6.9 g/dL (ref 6.5–8.1)

## 2022-06-13 LAB — OSMOLALITY: Osmolality: 301 mOsm/kg — ABNORMAL HIGH (ref 275–295)

## 2022-06-13 LAB — OSMOLALITY, URINE: Osmolality, Ur: 346 mOsm/kg (ref 300–900)

## 2022-06-13 LAB — SURGICAL PATHOLOGY

## 2022-06-13 MED ORDER — SODIUM CHLORIDE 0.9 % IV SOLN: INTRAVENOUS | Status: DC

## 2022-06-13 NOTE — Progress Notes (Signed)
Mobility Specialist: Progress Note   06/13/22 1530  Mobility  Activity Ambulated with assistance in hallway  Level of Assistance Standby assist, set-up cues, supervision of patient - no hands on  Assistive Device Front wheel walker  Distance Ambulated (ft) 470 ft  Activity Response Tolerated well  Mobility Referral Yes  $Mobility charge 1 Mobility   Pre-Mobility: 63 HR Post-Mobility: 70 HR, 140/95 (110) BP, 96% SpO2  Pt received in the bed and agreeable to mobility. Mod I with bed mobility and standby during ambulation. No c/o throughout. Pt back to bed after session with call bell and phone in reach.   Tylique Aull Mobility Specialist Please contact via SecureChat or Rehab office at [phone]

## 2022-06-13 NOTE — Progress Notes (Addendum)
      [address]       [name] [address]             [phone]        2 Days Post-Op Procedure(s) (LRB): XI ROBOTIC ASSISTED THORACOSCOPY PERICARDIAL WINDOW (Right)  Subjective: Patient is   Objective: Vital signs in last 24 hours: Temp:  [97.8 F (36.6 C)-97.9 F (36.6 C)] 97.8 F (36.6 C) (04/17 0415) Pulse Rate:  [66-75] 66 (04/17 0418) Cardiac Rhythm: Normal sinus rhythm (04/17 0340) Resp:  [15-19] 15 (04/17 0418) BP: (93-113)/(70-86) 97/73 (04/17 0418) SpO2:  [93 %-95 %] 95 % (04/17 0418) Arterial Line BP: (160)/(114) 160/114 (04/16 0800)   Current Weight  06/11/22 79 kg      Intake/Output from previous day: 04/16 0701 - 04/17 0700 In: 250 [P.O.:250] Out: 1060 [Urine:1000; Chest Tube:60]   Physical Exam:  Cardiovascular: RRR Pulmonary: Clear to auscultation bilaterally Abdomen: Soft, non tender, bowel sounds present. Extremities: No lower extremity edema. Wounds: Clean and dry.  No erythema or signs of infection. Chest tube: to suction, no air leak  Lab Results: CBC: Recent Labs    06/12/22 0135 06/13/22 0130  WBC 7.7 14.1*  HGB 14.1 13.3  HCT 40.5 38.1*  PLT 233 256    BMET:  Recent Labs    06/12/22 0135 06/13/22 0130  NA 131* 129*  K 4.3 3.9  CL 99 103  CO2 20* 21*  GLUCOSE 118* 119*  BUN 15 21*  CREATININE 1.32* 1.76*  CALCIUM 10.5* 10.5*     PT/INR: No results found for: "INR", "PROTIME" ABG:  INR: Will add last result for INR, ABG once components are confirmed Will add last 4 CBG results once components are confirmed  Assessment/Plan:  1. CV - SR with HR in the 70's. On Colchicine for recurrent pericardial effusion. 2.  Pulmonary - On room air. Right chest tube with 60 cc for 12 as recorded. Per my mark yesterday, he has had 100 cc last 24 hours. Chest tube is to suction, no air leak. CXR this am appears to show improvement in cardiomegaly, bibasilar atelectasis. Chest tube output decreasing;hope to  remove soon. Encourage incentive spirometer. Await final pathology. 3. AKI-Creatinine increased to 1.76. Per primary 4. Leukocytosis-WBC this am 14,100. Likely related to SIRS.  Donielle M ZimmermanPA-C 7:00 AM  Low CT output Will keep tube for 1 more day Mercedez Boule O Lashuna Tamashiro

## 2022-06-13 NOTE — Consult Note (Signed)
   [facility] Inpatient Consult   06/13/2022  ANDREA COLGLAZIER 08-Dec-1975 [id number]    Location: [facility] Transplant Liaison met with pt via bedside ([facility]).   [facility] Customer service manager [facility]) Accountable Care Organization [ACO] Patient: Civil engineer, contracting).    Primary Care Provider:  Georganna Skeans, MD [facility]  Patient screened for readmission hospitalization with noted low risk score for unplanned readmission risk with 2 IP in 6 months. [facility]/Population Health RN liaison will assess for potential [facility]) Care Management service needs for post hospital transition for care coordination. [facility] liaison spoke with pt at bedside and educated on [facility] services. No needs presented at this time however pt receptive to a post hospital follow up call.     Plan. HIPAA verified and [facility]/Population Health RN Liaison will continue to follow ongoing disposition in assessing for post hospital community care coordination/management needs.  Referral request for community care coordination: Anticipate [facility] TOC will follow up post hospital discharge if no other needs arise.   [facility] Care Management/Population Health does not replace or interfere with any arrangements made by the Inpatient Transition of Care team.   For questions contact:   Elliot Cousin, RN, BSN [facility] Liaison [place]   [facility]  Population Health Office Hours MTWF 8:00 am to 6 pm off on Thursday [phone] mobile [phone] [Office toll free line][facility] Office Hours are M-F 8:30 - 5 pm 24 hour nurse advise line [phone] Conceirge  [email or web address]

## 2022-06-13 NOTE — Progress Notes (Signed)
[facility] PROGRESS NOTE  CARMELLO CABINESS  DOB: 1976-01-05  PCP: Georganna Skeans, MD ZOX:[id number]  DOA: 06/07/2022  LOS: 6 days  Hospital Day: 7  Brief narrative: Christopher Lara is a 47 y.o. male with PMH significant for SIADH and hyponatremia with COVID in Jan 2021 with recurrence in February 2024 complicated by pericardial effusion.   04/12/2022-diagnosed with COVID, completed course of Paxlovid but continued to feel fatigued 2/21-presented with rhabdomyolysis, low sodium level of 105, admitted to ICU, started on 3% normal saline with subsequent improvement in sodium level close to normal. Also found to have large pericardial effusion requiring pericardiocentesis on 2/22 with clinical improvement.  Fluid analysis showed transudate. 3/27 most recent outpatient echo obtained by outpatient cardiology showed normal EF at recurrence of pericardial effusion, not in tamponade.  Because of absence of symptoms, no intervention was done at the time. 4/11, patient presented to the ED with complaint chest pain, hypertension, bradycardia  In the ED, patient was afebrile, heart rate in 60s, blood pressure 140s, breathing on room air Labs showed BUN/creatinine at 10/1.43, CK level elevated to 1700 Bedside echo showed large effusion, normal RV size and function. Admitted to [facility] Cardiology and CT surgery were consulted 4/15, patient underwent robotic-assisted right video thoracoscopy, pericardial window and intercostal nerve block by Dr. Cliffton Asters  Subjective: Patient was seen and examined this morning.   Lying on bed.  Not in distress.  Wife at bedside.  Chest tube on the right side.  Creatinine elevated to 1.76 this morning.  Started on IV fluid. Note that patient worked with mobility specialist this afternoon.   Assessment and plan: Recurrent pericardial effusion S/p VATS and pericardial window 4/15-Dr. Cliffton Asters Presented with chest pain, bradycardia, hypertension  Echo with recurrence of pericardial  effusion  Procedure as above On colchicine 0.6 mg daily  AKI Creatinine normal at baseline.  Gradually worsening, up to 1.76 today.  Probably nutrition related.  Started on NS at 75 mill per hour this morning Recent Labs    04/19/22 0257 04/20/22 0949 04/21/22 0331 04/30/22 1120 06/05/22 1619 06/07/22 1403 06/08/22 0058 06/11/22 0120 06/12/22 0135 06/13/22 0130  BUN 5* 8 10 10 11 10 11 16 15  21*  CREATININE 1.01 1.01 0.91 1.24 1.37* 1.43* 1.35* 1.60* 1.32* 1.76*   Hyponatremia Last admission in February his serum level was significantly low at 105 and required 3% normal saline with improvement to normal. No clear etiology.  Sodium level low again, trending down, 129 today.  Send urine studies. Recent Labs  Lab 06/07/22 1403 06/08/22 0058 06/11/22 0120 06/12/22 0135 06/13/22 0130  NA 138 136 131* 131* 129*   Hypercalcemia Chronically elevated CK Persistently elevated calcium level, CK level, LDH along with elevated ESR, CRP suspicion of some myositis or other rheumatological process.  However patient has no myalgias.  No joint pain.  PTH level elevated. Will give him information of her rheumatologist locally to follow-up with. Recent Labs    04/19/22 0257 04/19/22 2007 04/20/22 0949 04/21/22 0331 06/07/22 1403 06/08/22 0058 06/11/22 0120 06/12/22 0135 06/13/22 0130  CALCIUM 8.9  --  10.2   < > 10.7* 10.6* 10.4* 10.5* 10.5*  PHOS 1.5* 1.6* 2.0*  --   --   --   --   --   --   PTH  --   --   --   --  76*  --   --   --   --    < > = values in this interval  not displayed.   Recent Labs  Lab 06/07/22 2126 06/09/22 0036  CKTOTAL 1,764* 1,171*     Mobility: Encourage ambulation  Goals of care   Code Status: Full Code    DVT prophylaxis:  enoxaparin (LOVENOX) injection 40 mg Start: 06/12/22 0645 SCD's Start: 06/11/22 2103 SCDs Start: 06/07/22 2122   Antimicrobials: None Fluid: NS at 75 mill per hour Consultants: Cardiology, cardiothoracic surgery Family  Communication: Wife at bedside  Status: Inpatient Level of care:  Telemetry Cardiac   Needs to continue in-hospital care:  Has a chest tube  Patient from: Home Anticipated d/c to: Pending clinical course   Diet:  Diet Order             Diet regular Room service appropriate? Yes; Fluid consistency: Thin  Diet effective now                   Scheduled Meds:  acetaminophen  1,000 mg Oral Q6H   Or   acetaminophen (TYLENOL) oral liquid 160 mg/5 mL  1,000 mg Oral Q6H   bisacodyl  10 mg Oral Daily   colchicine  0.6 mg Oral BID   enoxaparin (LOVENOX) injection  40 mg Subcutaneous Daily   pantoprazole  40 mg Oral Daily   senna-docusate  1 tablet Oral QHS    PRN meds: acetaminophen **OR** acetaminophen, fentaNYL (SUBLIMAZE) injection, ondansetron **OR** ondansetron (ZOFRAN) IV, traMADol   Infusions:   sodium chloride 75 mL/hr at 06/13/22 0981    Antimicrobials: Anti-infectives (From admission, onward)    Start     Dose/Rate Route Frequency Ordered Stop   06/12/22 0000  ceFAZolin (ANCEF) IVPB 2g/100 mL premix        2 g 200 mL/hr over 30 Minutes Intravenous Every 8 hours 06/11/22 2102 06/12/22 0919       Nutritional status:  Body mass index is 26.48 kg/m.          Objective: Vitals:   06/13/22 0838 06/13/22 1220  BP: 101/74 118/87  Pulse: 66 70  Resp: 16 18  Temp: 97.8 F (36.6 C) 97.9 F (36.6 C)  SpO2: 95% 97%    Intake/Output Summary (Last 24 hours) at 06/13/2022 1541 Last data filed at 06/13/2022 0939 Gross per 24 hour  Intake 490 ml  Output 790 ml  Net -300 ml   Filed Weights   06/09/22 0433 06/10/22 0438 06/11/22 0423  Weight: 78.3 kg 78.6 kg 79 kg   Weight change:  Body mass index is 26.48 kg/m.   Physical Exam: General exam: Pleasant, middle-aged Caucasian male.  No myalgias. Skin: No rashes, lesions or ulcers. HEENT: Atraumatic, normocephalic, no obvious bleeding Lungs: Clear to auscultation bilaterally. Chest tube on right  side CVS: Regular rate and rhythm, no murmur.  GI/Abd soft, nontender, nondistended, bowel sound present CNS: Alert, awake, oriented x 3 Psychiatry: Sad affect Extremities: No pedal edema, no calf tenderness  Data Review: I have personally reviewed the laboratory data and studies available.  F/u labs ordered Unresulted Labs (From admission, onward)     Start     Ordered   06/13/22 0500  CBC  Once,   R       Question:  Specimen collection method  Answer:  Lab=Lab collect   06/11/22 2102   06/12/22 0500  CBC  Tomorrow morning,   R       Question:  Specimen collection method  Answer:  Lab=Lab collect   06/11/22 2102   06/07/22 2124  PTH-related peptide  Once,  R        06/07/22 2124            Total time spent in review of labs and imaging, patient evaluation, formulation of plan, documentation and communication with family: 55 minutes  Signed, Lorin Glass, MD [facility] 06/13/2022

## 2022-06-14 ENCOUNTER — Inpatient Hospital Stay (HOSPITAL_COMMUNITY): Payer: Federal, State, Local not specified - PPO

## 2022-06-14 DIAGNOSIS — I3139 Other pericardial effusion (noninflammatory): Secondary | ICD-10-CM | POA: Diagnosis not present

## 2022-06-14 DIAGNOSIS — Z4682 Encounter for fitting and adjustment of non-vascular catheter: Secondary | ICD-10-CM | POA: Diagnosis not present

## 2022-06-14 DIAGNOSIS — J90 Pleural effusion, not elsewhere classified: Secondary | ICD-10-CM | POA: Diagnosis not present

## 2022-06-14 DIAGNOSIS — J9811 Atelectasis: Secondary | ICD-10-CM | POA: Diagnosis not present

## 2022-06-14 LAB — BASIC METABOLIC PANEL
Anion gap: 7 (ref 5–15)
BUN: 19 mg/dL (ref 6–20)
CO2: 26 mmol/L (ref 22–32)
Calcium: 10 mg/dL (ref 8.9–10.3)
Chloride: 106 mmol/L (ref 98–111)
Creatinine, Ser: 1.47 mg/dL — ABNORMAL HIGH (ref 0.61–1.24)
GFR, Estimated: 58 mL/min — ABNORMAL LOW (ref >60)
Glucose, Bld: 105 mg/dL — ABNORMAL HIGH (ref 70–99)
Potassium: 4.1 mmol/L (ref 3.5–5.1)
Sodium: 139 mmol/L (ref 135–145)

## 2022-06-14 LAB — CBC
HCT: 41 % (ref 39.0–52.0)
Hemoglobin: 13.2 g/dL (ref 13.0–17.0)
MCH: 31.9 pg (ref 26.0–34.0)
MCHC: 32.2 g/dL (ref 30.0–36.0)
MCV: 99 fL (ref 80.0–100.0)
Platelets: 258 10*3/uL (ref 150–400)
RBC: 4.14 MIL/uL — ABNORMAL LOW (ref 4.22–5.81)
RDW: 13.7 % (ref 11.5–15.5)
WBC: 9.4 10*3/uL (ref 4.0–10.5)
nRBC: 0 % (ref 0.0–0.2)

## 2022-06-14 MED ORDER — LOPERAMIDE HCL 2 MG PO CAPS
2.0000 mg | ORAL_CAPSULE | Freq: Four times a day (QID) | ORAL | Status: DC | PRN
  Administered 2022-06-14: 2 mg via ORAL
  Filled 2022-06-14 (×2): qty 1

## 2022-06-14 NOTE — Evaluation (Signed)
Physical Therapy Evaluation Patient Details Name: Christopher Lara MRN: [id number] DOB: [birth year] Today's Date: 06/14/2022  History of Present Illness  Patient is a 47 year old male with recurrent pericardial effusion s/p VATS and pericardial window 4/15.  Clinical Impression  Patient is agreeable to PT evaluation. He was independent, driving, and working prior to the hospital stay. He lives with his spouse.  The patient is modified independent with all activity. Hallway ambulation performed without assistive device. Vitals stable throughout session and no shortness of breath is noted with activity. No pain reported with mobility. Encouraged routine ambulation for conditioning. No apparent acute PT needs at this time.      Recommendations for follow up therapy are one component of a multi-disciplinary discharge planning process, led by the attending physician.  Recommendations may be updated based on patient status, additional functional criteria and insurance authorization.  Follow Up Recommendations       Assistance Recommended at Discharge PRN  Patient can return home with the following  Assist for transportation    Equipment Recommendations None recommended by PT  Recommendations for Other Services       Functional Status Assessment Patient has not had a recent decline in their functional status     Precautions / Restrictions Precautions Precautions: Other (comment) Precaution Comments: R chest tube Restrictions Weight Bearing Restrictions: No      Mobility  Bed Mobility Overal bed mobility: Modified Independent                  Transfers Overall transfer level: Modified independent                      Ambulation/Gait Ambulation/Gait assistance: Modified independent (Device/Increase time) Gait Distance (Feet): 470 Feet Assistive device: None Gait Pattern/deviations: WFL(Within Functional Limits)       General Gait Details: steady with hallway  ambulation without assistive device. heart rate around 106bpm with ambulation and Sp02 in the 90's on room air. no shortness of breath or pain is reported. encouraged routine ambulation for conditioning  Stairs            Wheelchair Mobility    Modified Rankin (Stroke Patients Only)       Balance Overall balance assessment: Modified Independent                                           Pertinent Vitals/Pain Pain Assessment Pain Assessment: No/denies pain    Home Living Family/patient expects to be discharged to:: Private residence Living Arrangements: Spouse/significant other Available Help at Discharge: Family Type of Home: House                  Prior Function Prior Level of Function : Independent/Modified Independent;Working/employed;Driving             Mobility Comments: independent. works at [employer]        Extremity/Trunk Assessment   Upper Extremity Assessment Upper Extremity Assessment: Overall WFL for tasks assessed    Lower Extremity Assessment Lower Extremity Assessment: Overall WFL for tasks assessed       Communication   Communication: No difficulties  Cognition Arousal/Alertness: Awake/alert Behavior During Therapy: WFL for tasks assessed/performed Overall Cognitive Status: Within Functional Limits for tasks assessed  General Comments      Exercises     Assessment/Plan    PT Assessment Patient does not need any further PT services  PT Problem List         PT Treatment Interventions      PT Goals (Current goals can be found in the Care Plan section)  Acute Rehab PT Goals PT Goal Formulation: All assessment and education complete, DC therapy    Frequency       Co-evaluation               AM-PAC PT "6 Clicks" Mobility  Outcome Measure Help needed turning from your back to your side while in a flat bed without  using bedrails?: None Help needed moving from lying on your back to sitting on the side of a flat bed without using bedrails?: None Help needed moving to and from a bed to a chair (including a wheelchair)?: None Help needed standing up from a chair using your arms (e.g., wheelchair or bedside chair)?: None Help needed to walk in hospital room?: None Help needed climbing 3-5 steps with a railing? : None 6 Click Score: 24    End of Session   Activity Tolerance: Patient tolerated treatment well Patient left: in bed;with call bell/phone within reach   PT Visit Diagnosis: Muscle weakness (generalized) (M62.81)    Time: 1610-9604 PT Time Calculation (min) (ACUTE ONLY): 10 min   Charges:   PT Evaluation $PT Eval Low Complexity: 1 Low          Donna Bernard, PT, MPT   Ina Homes 06/14/2022, 12:37 PM

## 2022-06-14 NOTE — Progress Notes (Addendum)
Pt is alert and fully oriented x 4, afebrile, stable hemodynamically. NSR on the monitor, HR 60s-70s. Normal respiratory effort without any acute distress noted overnight.    Chest tube with continue suction -20 cmH2O, appears to have serosanguinous drainage total 120 ml in the past 24 hours. Pain is well tolerated with Tylenol q 6 hrs. Pt is able to ambulate well independently to the bathroom after setup the chest tube under water seal intermittently for bathroom privilege. Pt is able to rest well with no complaints overnight. His wife stays at bedside. We will continue to monitor.   Filiberto Pinks, RN

## 2022-06-14 NOTE — Discharge Instructions (Signed)
Discharge Instructions:  1. You may shower, please wash incisions daily with soap and water and keep dry.  If you wish to cover wounds with dressing you may do so but please keep clean and change daily.  No tub baths or swimming until incisions have completely healed.  If your incisions become red or develop any drainage please call our office at [phone]  2. No Driving until you are no longer using narcotic pain medications  3. Fever of 101.5 for at least 24 hours with no source, please contact our office at [phone]  4. Activity- up as tolerated, please walk at least 3 times per day.  Avoid strenuous activity, no lifting, pushing, or pulling with your arms over 8-10 lbs for 2 weeks  5. If any questions or concerns arise, please do not hesitate to contact our office at [phone]

## 2022-06-14 NOTE — Progress Notes (Signed)
Christopher Lara Kitchen PROGRESS NOTE  Christopher Lara  DOB: March 24, 1975  PCP: Georganna Skeans, MD ZOX:[id number]  DOA: 06/07/2022  LOS: 7 days  Hospital Day: 8  Brief narrative: BOSTON COOKSON is a 47 y.o. male with PMH significant for SIADH and hyponatremia with COVID in Jan 2021 with recurrence in February 2024 complicated by pericardial effusion.   04/12/2022-diagnosed with COVID, completed course of Paxlovid but continued to feel fatigued 2/21-presented with rhabdomyolysis, low sodium level of 105, admitted to ICU, started on 3% normal saline with subsequent improvement in sodium level close to normal. Also found to have large pericardial effusion requiring pericardiocentesis on 2/22 with clinical improvement.  Fluid analysis showed transudate. 3/27 most recent outpatient echo obtained by outpatient cardiology showed normal EF at recurrence of pericardial effusion, not in tamponade.  Because of absence of symptoms, no intervention was done at the time. 4/11, patient presented to the ED with complaint chest pain, hypertension, bradycardia  In the ED, patient was afebrile, heart rate in 60s, blood pressure 140s, breathing on room air Labs showed BUN/creatinine at 10/1.43, CK level elevated to 1700 Bedside echo showed large effusion, normal RV size and function. Admitted to [facility] Cardiology and CT surgery were consulted 4/15, patient underwent robotic-assisted right video thoracoscopy, pericardial window and intercostal nerve block by Dr. Cliffton Asters  Subjective: Patient was seen and examined this morning.   Lying on bed.  Not in distress.  Wife at bedside.  Chest tube on the right side.  Feels better today.  Creatinine improving.  Sodium level improved as well.  Assessment and plan: Recurrent pericardial effusion S/p VATS and pericardial window 4/15-Dr. Cliffton Asters Presented with chest pain, bradycardia, hypertension  Echo with recurrence of pericardial effusion  Procedure as above Defer to CT surgery for  timing of chest tube removal. On colchicine 0.6 mg daily  AKI Creatinine normal at baseline.  Gradually worsening to peak at 1.76 yesterday.  Improving with fluid.  Encourage oral hydration. Recent Labs    04/20/22 0949 04/21/22 0331 04/30/22 1120 06/05/22 1619 06/07/22 1403 06/08/22 0058 06/11/22 0120 06/12/22 0135 06/13/22 0130 06/14/22 0850  BUN 21* 19  CREATININE 1.01 0.91 1.24 1.37* 1.43* 1.35* 1.60* 1.32* 1.76* 1.47*    Hyponatremia Last admission in February his serum level was significantly low at 105 and required 3% normal saline with improvement to normal. No clear etiology.  Sodium level low was low at 129 yesterday.  Significantly better at 139 today.  May be with normal saline Recent Labs  Lab 06/08/22 0058 06/11/22 0120 06/12/22 0135 06/13/22 0130 06/14/22 0850  NA 136 131* 131* 129* 139    Hypercalcemia Chronically elevated CK Persistently elevated calcium level, CK level, LDH along with elevated ESR, CRP suspicion of some myositis or other rheumatological process.  However patient has no myalgias.  No joint pain.  PTH level elevated. Will give him information for a rheumatologist locally to follow-up with. Recent Labs    04/19/22 0257 04/19/22 2007 04/20/22 0949 04/21/22 0331 06/07/22 1403 06/08/22 0058 06/11/22 0120 06/12/22 0135 06/13/22 0130 06/14/22 0850  CALCIUM 8.9  --  10.2   < > 10.7* 10.6* 10.4* 10.5* 10.5* 10.0  PHOS 1.5* 1.6* 2.0*  --   --   --   --   --   --   --   PTH  --   --   --   --  63*  --   --   --   --   --    < > =  values in this interval not displayed.    Recent Labs  Lab 06/07/22 2126 06/09/22 0036  CKTOTAL 1,764* 1,171*      Mobility: Encourage ambulation  Goals of care   Code Status: Full Code    DVT prophylaxis:  enoxaparin (LOVENOX) injection 40 mg Start: 06/12/22 0645 SCDs Start: 06/07/22 2122   Antimicrobials: None Fluid: Can stop fluid today Consultants: Cardiology,  cardiothoracic surgery Family Communication: Wife at bedside  Status: Inpatient Level of care:  Telemetry Cardiac   Needs to continue in-hospital care:  Has a chest tube  Patient from: Home Anticipated d/c to: Pending clinical course   Diet:  Diet Order             Diet regular Room service appropriate? Yes; Fluid consistency: Thin  Diet effective now                   Scheduled Meds:  acetaminophen  1,000 mg Oral Q6H   Or   acetaminophen (TYLENOL) oral liquid 160 mg/5 mL  1,000 mg Oral Q6H   bisacodyl  10 mg Oral Daily   colchicine  0.6 mg Oral BID   enoxaparin (LOVENOX) injection  40 mg Subcutaneous Daily   pantoprazole  40 mg Oral Daily   senna-docusate  1 tablet Oral QHS    PRN meds: acetaminophen **OR** acetaminophen, fentaNYL (SUBLIMAZE) injection, loperamide, ondansetron **OR** ondansetron (ZOFRAN) IV, traMADol   Infusions:     Antimicrobials: Anti-infectives (From admission, onward)    Start     Dose/Rate Route Frequency Ordered Stop   06/12/22 0000  ceFAZolin (ANCEF) IVPB 2g/100 mL premix        2 g 200 mL/hr over 30 Minutes Intravenous Every 8 hours 06/11/22 2102 06/12/22 0919       Nutritional status:  Body mass index is 26.48 kg/m.          Objective: Vitals:   06/14/22 1300 06/14/22 1357  BP: 116/80 116/87  Pulse:    Resp: 16 18  Temp: 97.9 F (36.6 C)   SpO2:      Intake/Output Summary (Last 24 hours) at 06/14/2022 1430 Last data filed at 06/14/2022 1044 Gross per 24 hour  Intake 2335.24 ml  Output 590 ml  Net 1745.24 ml    Filed Weights   06/09/22 0433 06/10/22 0438 06/11/22 0423  Weight: 78.3 kg 78.6 kg 79 kg   Weight change:  Body mass index is 26.48 kg/m.   Physical Exam: General exam: Pleasant, middle-aged Caucasian male.  No myalgias. Skin: No rashes, lesions or ulcers. HEENT: Atraumatic, normocephalic, no obvious bleeding Lungs: Clear to auscultation bilaterally. Chest tube on right side CVS: Regular  rate and rhythm, no murmur.  GI/Abd soft, nontender, nondistended, bowel sound present CNS: Alert, awake, oriented x 3 Psychiatry: Mood appropriate Extremities: No pedal edema, no calf tenderness  Data Review: I have personally reviewed the laboratory data and studies available.  F/u labs ordered Unresulted Labs (From admission, onward)     Start     Ordered   06/15/22 0500  Basic metabolic panel  Daily,   R     Question:  Specimen collection method  Answer:  Lab=Lab collect   06/14/22 0808   06/15/22 0500  CBC with Differential/Platelet  Daily,   R     Question:  Specimen collection method  Answer:  Lab=Lab collect   06/14/22 0808   06/15/22 0500  CK  Tomorrow morning,   R       Question:  Specimen collection method  Answer:  Lab=Lab collect   06/14/22 6578   06/07/22 2124  PTH-related peptide  Once,   R        06/07/22 2124            Total time spent in review of labs and imaging, patient evaluation, formulation of plan, documentation and communication with family: 45 minutes  Signed, Lorin Glass, MD [facility] 06/14/2022

## 2022-06-14 NOTE — Plan of Care (Signed)
  Problem: Education: Goal: Knowledge of General Education information will improve Description: Including pain rating scale, medication(s)/side effects and non-pharmacologic comfort measures Outcome: Progressing   Problem: Activity: Goal: Risk for activity intolerance will decrease Outcome: Progressing   Problem: Nutrition: Goal: Adequate nutrition will be maintained Outcome: Progressing   Problem: Coping: Goal: Level of anxiety will decrease Outcome: Progressing   Problem: Pain Managment: Goal: General experience of comfort will improve Outcome: Progressing   Problem: Education: Goal: Knowledge of disease or condition will improve Outcome: Progressing   Problem: Activity: Goal: Risk for activity intolerance will decrease Outcome: Progressing

## 2022-06-14 NOTE — Progress Notes (Addendum)
      [address]       [place] [address]             [phone]        3 Days Post-Op Procedure(s) (LRB): XI ROBOTIC ASSISTED THORACOSCOPY PERICARDIAL WINDOW (Right)  Subjective: Patient has diarrhea  Objective: Vital signs in last 24 hours: Temp:  [97.6 F (36.4 C)-97.9 F (36.6 C)] 97.8 F (36.6 C) (04/18 0406) Pulse Rate:  [63-73] 63 (04/18 0406) Cardiac Rhythm: Normal sinus rhythm (04/17 2000) Resp:  [15-18] 18 (04/18 0406) BP: (101-130)/(74-99) 126/98 (04/18 0406) SpO2:  [95 %-97 %] 97 % (04/18 0406)   Current Weight  06/11/22 79 kg      Intake/Output from previous day: 04/17 0701 - 04/18 0700 In: 2239 [P.O.:730; I.V.:1509] Out: 540 [Urine:420; Chest Tube:120]   Physical Exam:  Cardiovascular: RRR Pulmonary: Clear to auscultation bilaterally Wounds: Clean and dry.  No erythema or signs of infection. Chest tube: to suction, no air leak  Lab Results: CBC: Recent Labs    06/12/22 0135 06/13/22 0130  WBC 7.7 14.1*  HGB 14.1 13.3  HCT 40.5 38.1*  PLT 233 256    BMET:  Recent Labs    06/12/22 0135 06/13/22 0130  NA 131* 129*  K 4.3 3.9  CL 99 103  CO2 20* 21*  GLUCOSE 118* 119*  BUN 15 21*  CREATININE 1.32* 1.76*  CALCIUM 10.5* 10.5*     PT/INR: No results found for: "INR", "PROTIME" ABG:  INR: Will add last result for INR, ABG once components are confirmed Will add last 4 CBG results once components are confirmed  Assessment/Plan:  1. CV - SR with HR in the 70's. On Colchicine for recurrent pericardial effusion. 2.  Pulmonary - On room air. Right chest tube with 120 cc for last 24 hours. Chest tube is to suction, no air leak. CXR this am appears to show stable. As discussed with Dr. Cliffton Asters, will remove chest tube. Encourage incentive spirometer. Check CXR this afternoon. Final pathology:Benign pericardial tissue with focal mild fibrosis and reactive changes.  Negative for malignancy.  3. AKI-Creatinine  increased to 1.76 yesterday. Per primary 4. Leukocytosis-WBC 04/17 14,100. Likely related to SIRS. 5. Diarrhea likely secondary to Colchicine 6. If CXR stable, management per primary. Will see PRN  Donielle M ZimmermanPA-C 7:01 AM   Agree with above Will remove chest tube  Keyry Iracheta O Linetta Regner

## 2022-06-15 ENCOUNTER — Inpatient Hospital Stay (HOSPITAL_COMMUNITY): Payer: Federal, State, Local not specified - PPO

## 2022-06-15 ENCOUNTER — Other Ambulatory Visit (HOSPITAL_COMMUNITY): Payer: Self-pay

## 2022-06-15 ENCOUNTER — Encounter: Payer: Federal, State, Local not specified - PPO | Admitting: Thoracic Surgery (Cardiothoracic Vascular Surgery)

## 2022-06-15 DIAGNOSIS — J939 Pneumothorax, unspecified: Secondary | ICD-10-CM | POA: Diagnosis not present

## 2022-06-15 DIAGNOSIS — I3139 Other pericardial effusion (noninflammatory): Secondary | ICD-10-CM | POA: Diagnosis not present

## 2022-06-15 LAB — CBC WITH DIFFERENTIAL/PLATELET
Abs Immature Granulocytes: 0.02 10*3/uL (ref 0.00–0.07)
Basophils Absolute: 0.1 10*3/uL (ref 0.0–0.1)
Basophils Relative: 1 %
Eosinophils Absolute: 0.1 10*3/uL (ref 0.0–0.5)
Eosinophils Relative: 1 %
HCT: 38 % — ABNORMAL LOW (ref 39.0–52.0)
Hemoglobin: 12.8 g/dL — ABNORMAL LOW (ref 13.0–17.0)
Immature Granulocytes: 0 %
Lymphocytes Relative: 32 %
Lymphs Abs: 2.8 10*3/uL (ref 0.7–4.0)
MCH: 32.5 pg (ref 26.0–34.0)
MCHC: 33.7 g/dL (ref 30.0–36.0)
MCV: 96.4 fL (ref 80.0–100.0)
Monocytes Absolute: 0.5 10*3/uL (ref 0.1–1.0)
Monocytes Relative: 5 %
Neutro Abs: 5.3 10*3/uL (ref 1.7–7.7)
Neutrophils Relative %: 61 %
Platelets: 238 10*3/uL (ref 150–400)
RBC: 3.94 MIL/uL — ABNORMAL LOW (ref 4.22–5.81)
RDW: 13.6 % (ref 11.5–15.5)
WBC: 8.7 10*3/uL (ref 4.0–10.5)
nRBC: 0 % (ref 0.0–0.2)

## 2022-06-15 LAB — CK: Total CK: 733 U/L — ABNORMAL HIGH (ref 49–397)

## 2022-06-15 LAB — BASIC METABOLIC PANEL
Anion gap: 8 (ref 5–15)
BUN: 19 mg/dL (ref 6–20)
CO2: 23 mmol/L (ref 22–32)
Calcium: 10.5 mg/dL — ABNORMAL HIGH (ref 8.9–10.3)
Chloride: 106 mmol/L (ref 98–111)
Creatinine, Ser: 1.42 mg/dL — ABNORMAL HIGH (ref 0.61–1.24)
GFR, Estimated: >60 mL/min (ref >60)
Glucose, Bld: 84 mg/dL (ref 70–99)
Potassium: 3.9 mmol/L (ref 3.5–5.1)
Sodium: 137 mmol/L (ref 135–145)

## 2022-06-15 LAB — PTH-RELATED PEPTIDE: PTH-related peptide: <2 pmol/L

## 2022-06-15 MED ORDER — LOPERAMIDE HCL 2 MG PO CAPS
2.0000 mg | ORAL_CAPSULE | Freq: Four times a day (QID) | ORAL | 0 refills | Status: DC | PRN
  Filled 2022-06-15: qty 30, 8d supply, fill #0

## 2022-06-15 MED ORDER — COLCHICINE 0.6 MG PO TABS
0.6000 mg | ORAL_TABLET | Freq: Two times a day (BID) | ORAL | 0 refills | Status: DC
  Filled 2022-06-15: qty 120, 60d supply, fill #0

## 2022-06-15 NOTE — Plan of Care (Signed)
Plan of care reviewed. Pt has been progressing.   Problem: Clinical Measurements: Dx: pericarditis with recurrent pericardial effusion. S/p robotic assisted thoracoscopic pericardial window (right) by Dr. Sherlynn Stalls foot on 06/11/22 ( 4 days post-op) Goal: Cardiovascular complication will be avoided Outcome: Progressing: Chest tube for draining pericardial effusion was improved. The chest tube was removed on 06/14/22. Will check chest x-ray at am. Pt has no sings or symptoms of cardiac tamponade. NSR on the monitor. BP stable.   Problem: Clinical Measurements: Goal: Respiratory complications will improve Outcome: Progressing: on room air, normal respiratory effort, no SOB. SPO2 97-99%.    Problem: Clinical Measurements: Goal: Diagnostic test results will improve Outcome: Progressing:   #Chest x-ray on 06/14/22: stable chest radiograph. Low lung volumes with mild bibasilar atelectasis. Deep breathing exercise with incentive spirometer is encouraged.   #Surgical pathology final result reported on specimen pericardial effusion which sent on 06/11/22  FINAL MICROSCOPIC DIAGNOSIS:  PERICARDIUM, BIOPSY:       -Benign pericardial tissue with focal mild fibrosis and reactive changes.       -Negative for malignancy.    #Hyponatremia was resolved. Lab BMP Na level from 129 to 139 yesterday. 0.9% NSS infusion was stopped. Pt reported improving oral intake. We will monitor BMP this am.  Problem: Activity: Goal: Risk for activity intolerance will decrease Outcome: Progressing; independently increasing level of ambulation in room and hall way. Well tolerated.   Problem: Pain Managment: Goal: General experience of comfort will improve Outcome: Progressing: chest pain is well tolerated. He is able to rest well overnight with no complaints. Tylenol 1000 mg q 6 hrs per schedule.    Christopher Pinks, RN

## 2022-06-15 NOTE — Progress Notes (Signed)
Mobility Specialist: Progress Note   06/15/22 1124  Mobility  Activity Ambulated independently in hallway  Level of Assistance Independent  Assistive Device None  Distance Ambulated (ft) 1000 ft  Activity Response Tolerated well  Mobility Referral Yes  $Mobility charge 1 Mobility   Pre-Mobility: 75 HR Post-Mobility: 85 HR  Received pt in bed having no complaints and agreeable to mobility. Pt was asymptomatic throughout ambulation and returned to room w/o fault. Left in bed w/ call bell in reach and all needs met.  Haily Caley Mobility Specialist Please contact via SecureChat or Rehab office at [phone]

## 2022-06-15 NOTE — Progress Notes (Signed)
      [address]       [place] [address]             [phone]       CXR reviewed:  looks good, no pneumothorax, trace pleural effusion on left   We will sign off.. F/U appointment in chart  Lowella Dandy, PA-C 7:52 AM 06/15/22

## 2022-06-15 NOTE — Discharge Summary (Signed)
Physician Discharge Summary  Christopher Lara ZOX:[id number] DOB: [birth year] DOA: 06/07/2022  PCP: Georganna Skeans, MD  Admit date: 06/07/2022 Discharge date: 06/15/2022  Admitted From: Home Discharge disposition: Home  Recommendations at discharge:  You have been started on colchicine 0.6 mg twice daily for 3 months for pericarditis. Use Imodium as needed for diarrhea Follow-up with cardiology and rheumatology as an outpatient   Brief narrative: Christopher Lara is a 47 y.o. male with PMH significant for SIADH and hyponatremia with COVID in Jan 2021 with recurrence in February 2024 complicated by pericardial effusion.   04/12/2022-diagnosed with COVID, completed course of Paxlovid but continued to feel fatigued 2/21-presented with rhabdomyolysis, low sodium level of 105, admitted to ICU, started on 3% normal saline with subsequent improvement in sodium level close to normal. Also found to have large pericardial effusion requiring pericardiocentesis on 2/22 with clinical improvement.  Fluid analysis showed transudate. 3/27 most recent outpatient echo obtained by outpatient cardiology showed normal EF at recurrence of pericardial effusion, not in tamponade.  Because of absence of symptoms, no intervention was done at the time. 4/11, patient presented to the ED with complaint chest pain, hypertension, bradycardia  In the ED, patient was afebrile, heart rate in 60s, blood pressure 140s, breathing on room air Labs showed BUN/creatinine at 10/1.43, CK level elevated to 1700 Bedside echo showed large effusion, normal RV size and function. Admitted to [facility] Cardiology and CT surgery were consulted 4/15, patient underwent robotic-assisted right video thoracoscopy, pericardial window and intercostal nerve block by Dr. Cliffton Asters  Subjective: Patient was seen and examined this morning.  Walking inside the room.  Wife at bedside. Patient feels better after chest tube was removed yesterday.  Able to  breathe and ambulate normally. Diarrhea improving with Imodium as needed Renal function improving without fluids  Hospital course: Recurrent pericarditis and pericardial effusion S/p VATS and pericardial window 4/15-Dr. Cliffton Asters Presented with chest pain, bradycardia, hypertension  Echo with recurrence of pericardial effusion  4/15, patient underwent robotic-assisted right video thoracoscopy, pericardial window and intercostal nerve block by Dr. Cliffton Asters Chest tube removed 4/18.  Subsequent chest x-ray is unremarkable Patient has been started on colchicine 0.6 mg twice daily by cardiology for pericarditis.  Patient will need for 3 months.  At discharge, I have given him a prescription for a month supply.  He will follow-up with cardiology as an outpatient.  He has an appointment with cardiology next week.  Acute diarrhea Started after colchicine was started.  Improving with as needed Imodium.  Prescription given for the same  AKI Probably due to fluid loss from diarrhea.   Creatinine normal at baseline.  Gradually worsened to peak at 1.76.  Briefly hydrated.  Currently creatinine is improving with oral hydration.  Encourage oral hydration at home. Recent Labs    04/21/22 0331 04/30/22 1120 06/05/22 1619 06/07/22 1403 06/08/22 0058 06/11/22 0120 06/12/22 0135 06/13/22 0130 06/14/22 0850 06/15/22 0130  BUN 10 10 11 10 11 16 15  21* 19 19  CREATININE 0.91 1.24 1.37* 1.43* 1.35* 1.60* 1.32* 1.76* 1.47* 1.42*   Hyponatremia Last admission in February his serum level was significantly low at 105 and required 3% normal saline with improvement to normal. No clear etiology.  Sodium level was low this admission as well because of of poor oral intake as well as diarrhea.  Sodium level improved subsequently. Recent Labs  Lab 06/11/22 0120 06/12/22 0135 06/13/22 0130 06/14/22 0850 06/15/22 0130  NA 131* 131* 129* 139 137  Possible underlying connective tissue disease Patient has  persistently elevated PTH, calcium, CK , LDH, ESR and CRP.  I am not sure if all of these abnormalities are explained by pericarditis only.  However patient has no myalgias.  No joint pain.   He may need further workup.  Follow-up with rheumatologist as an outpatient.  Contact information one of the rheumatologist in town given. Recent Labs    04/19/22 0257 04/19/22 2007 04/20/22 0949 04/21/22 0331 06/07/22 1403 06/08/22 0058 06/11/22 0120 06/12/22 0135 06/13/22 0130 06/14/22 0850 06/15/22 0130  CALCIUM 8.9  --  10.2   < > 10.7*   < > 10.4* 10.5* 10.5* 10.0 10.5*  PHOS 1.5* 1.6* 2.0*  --   --   --   --   --   --   --   --   PTH  --   --   --   --  50*  --   --   --   --   --   --    < > = values in this interval not displayed.   Recent Labs  Lab 06/09/22 0036 06/15/22 0130  CKTOTAL 1,171* 733*     Goals of care   Code Status: Full Code   Wounds:  - Wound / Incision (Open or Dehisced) 04/19/22 Puncture Sternum Left;Lower (Active)  Date First Assessed/Time First Assessed: 04/19/22 2248   Wound Type: Puncture  Location: Sternum  Location Orientation: Left;Lower  Present on Admission: No    Assessments 04/19/2022 10:48 PM 04/21/2022 10:17 AM  Dressing Type Gauze (Comment);Transparent dressing Gauze (Comment);Transparent dressing  Dressing Changed New --  Dressing Status Clean, Dry, Intact Clean, Dry, Intact  Site / Wound Assessment Dressing in place / Unable to assess --  Drainage Amount None --     No associated orders.     Incision (Closed) 06/11/22 Chest Right (Active)  Date First Assessed/Time First Assessed: 06/11/22 1839   Location: Chest  Location Orientation: Right    Assessments 06/11/2022  7:20 PM 06/14/2022  8:00 PM  Dressing Type Gauze (Comment);Tape dressing;Liquid skin adhesive Liquid skin adhesive;Gauze (Comment)  Dressing Old drainage (marked) Clean, Dry, Intact  Dressing Change Frequency PRN PRN  Site / Wound Assessment Dressing in place / Unable to assess  Clean;Dry  Margins Attached edges (approximated) Attached edges (approximated)  Closure Skin glue Skin glue;Sutures  Drainage Amount -- None  Drainage Description Sanguineous No odor  Treatment -- Other (Comment)     No associated orders.    Discharge Exam:   Vitals:   06/14/22 2305 06/15/22 0443 06/15/22 0859 06/15/22 0949  BP: 111/77 112/79  99/70  Pulse: (!) 57 (!) 59  73  Resp: 12 15 14 14   Temp: 97.6 F (36.4 C) (!) 97.5 F (36.4 C)  97.9 F (36.6 C)  TempSrc: Oral Oral Oral Oral  SpO2: 99% 99%  97%  Weight:      Height:        Body mass index is 26.48 kg/m.   General exam: Pleasant, middle-aged Caucasian male.  No myalgias. Skin: No rashes, lesions or ulcers. HEENT: Atraumatic, normocephalic, no obvious bleeding Lungs: Clear to auscultation bilaterally. Chest tube on right side CVS: Regular rate and rhythm, no murmur.  GI/Abd soft, nontender, nondistended, bowel sound present CNS: Alert, awake, oriented x 3 Psychiatry: Mood appropriate Extremities: No pedal edema, no calf tenderness  Follow ups:    Follow-up Information     Casimer Lanius, MD. Call.   Specialty: Rheumatology  Contact information: [address] [place] [address] [phone]         Corliss Skains, MD. Go on 06/22/2022.   Specialty: Cardiothoracic Surgery Why: Appointment time is at 10:40 am Contact information: [address] [place] [address] [phone]         Azalee Course, [place] Follow up on 06/20/2022.   Specialties: Cardiology, Radiology Why: 2:45PM. Cardiology follow up Contact information: [address] [place] [address] [phone]         Georganna Skeans, MD Follow up.   Specialty: Family Medicine Contact information: [address] [place] [address] [phone]                 Discharge Instructions:   Discharge Instructions     Call MD for:  difficulty breathing,  headache or visual disturbances   Complete by: As directed    Call MD for:  extreme fatigue   Complete by: As directed    Call MD for:  hives   Complete by: As directed    Call MD for:  persistant dizziness or light-headedness   Complete by: As directed    Call MD for:  persistant nausea and vomiting   Complete by: As directed    Call MD for:  severe uncontrolled pain   Complete by: As directed    Call MD for:  temperature >100.4   Complete by: As directed    Diet general   Complete by: As directed    Discharge instructions   Complete by: As directed    Recommendations at discharge:   You have been started on colchicine 0.6 mg twice daily for 3 months for pericarditis.  Use Imodium as needed for diarrhea  Follow-up with cardiology and rheumatology as an outpatient  General discharge instructions: Follow with Primary MD Georganna Skeans, MD in 7 days  Please request your PCP  to go over your hospital tests, procedures, radiology results at the follow up. Please get your medicines reviewed and adjusted.  Your PCP may decide to repeat certain labs or tests as needed. Do not drive, operate heavy machinery, perform activities at heights, swimming or participation in water activities or provide baby sitting services if your were admitted for syncope or siezures until you have seen by Primary MD or a Neurologist and advised to do so again. [place] Controlled Substance Reporting System database was reviewed. Do not drive, operate heavy machinery, perform activities at heights, swim, participate in water activities or provide baby-sitting services while on medications for pain, sleep and mood until your outpatient physician has reevaluated you and advised to do so again.  You are strongly recommended to comply with the dose, frequency and duration of prescribed medications. Activity: As tolerated with Full fall precautions use walker/cane & assistance as needed Avoid using any  recreational substances like cigarette, tobacco, alcohol, or non-prescribed drug. If you experience worsening of your admission symptoms, develop shortness of breath, life threatening emergency, suicidal or homicidal thoughts you must seek medical attention immediately by calling 911 or calling your MD immediately  if symptoms less severe. You must read complete instructions/literature along with all the possible adverse reactions/side effects for all the medicines you take and that have been prescribed to you. Take any new medicine only after you have completely understood and accepted all the possible adverse reactions/side effects.  Wear Seat belts while driving. You were cared for by a hospitalist during your hospital  stay. If you have any questions about your discharge medications or the care you received while you were in the hospital after you are discharged, you can call the unit and ask to speak with the hospitalist or the covering physician. Once you are discharged, your primary care physician will handle any further medical issues. Please note that NO REFILLS for any discharge medications will be authorized once you are discharged, as it is imperative that you return to your primary care physician (or establish a relationship with a primary care physician if you do not have one).   Increase activity slowly   Complete by: As directed        Discharge Medications:   Allergies as of 06/15/2022   No Known Allergies      Medication List     STOP taking these medications    acetaminophen 650 MG CR tablet Commonly known as: TYLENOL       TAKE these medications    colchicine 0.6 MG tablet Take 1 tablet (0.6 mg total) by mouth 2 (two) times daily.   loperamide 2 MG capsule Commonly known as: IMODIUM Take 1 capsule (2 mg total) by mouth every 6 (six) hours as needed for diarrhea or loose stools.         The results of significant diagnostics from this hospitalization  (including imaging, microbiology, ancillary and laboratory) are listed below for reference.    Procedures and Diagnostic Studies:   ECHOCARDIOGRAM LIMITED  Result Date: 06/08/2022    ECHOCARDIOGRAM LIMITED REPORT   Patient Name:   Christopher Lara Date of Exam: 06/08/2022 Medical Rec #:  [id number]      Height:       68.0 in Accession #:    [id number]     Weight:       174.0 lb Date of Birth:  1975-12-19      BSA:          1.926 m Patient Age:    46 years       BP:           115/79 mmHg Patient Gender: M              HR:           62 bpm. Exam Location:  Inpatient Procedure: Limited Echo, Color Doppler and Cardiac Doppler Indications:    I31.3 Pericardial effusion  History:        Patient has prior history of Echocardiogram examinations, most                 recent 05/23/2022.  Sonographer:    Irving Burton Senior RDCS Referring Phys: [id number] Parke Poisson  Sonographer Comments: Limited for pericardial effusion IMPRESSIONS  1. Large pericardial effusion. The pericardial effusion is circumferential. Conclusion(s)/Recommendation(s): Large pericardial effusion with RV collapse during diastole, borderline + inflow velocities; however the IVC collapses. Borderline echo features for tamponade. Discussed with primary team. FINDINGS  Pericardium: A large pericardial effusion is present. The pericardial effusion is circumferential. Additional Comments: Spectral Doppler performed. Color Doppler performed.  Carolan Clines Electronically signed by Carolan Clines Signature Date/Time: 06/08/2022/12:00:59 PM    Final    CT CHEST ABDOMEN PELVIS WO CONTRAST  Result Date: 06/07/2022 CLINICAL DATA:  Pericardial effusion hypercalcemia left-sided chest pain EXAM: CT CHEST, ABDOMEN AND PELVIS WITHOUT CONTRAST TECHNIQUE: Multidetector CT imaging of the chest, abdomen and pelvis was performed following the standard protocol without IV contrast. RADIATION DOSE REDUCTION: This exam was performed according to the departmental  dose-optimization  program which includes automated exposure control, adjustment of the mA and/or kV according to patient size and/or use of iterative reconstruction technique. COMPARISON:  Chest x-ray 06/07/2022 FINDINGS: CT CHEST FINDINGS Cardiovascular: Limited evaluation without intravenous contrast. Nonaneurysmal aorta. Mild atherosclerosis. Large circumferential pericardial effusion measuring up to 2.5 cm. Mediastinum/Nodes: Midline trachea. No thyroid mass. No suspicious lymph nodes. Esophagus within normal limits. Lungs/Pleura: No acute airspace disease or effusion. Mild subpleural scarring. Musculoskeletal: No chest wall mass or suspicious bone lesions identified. CT ABDOMEN PELVIS FINDINGS Hepatobiliary: No focal liver abnormality is seen. No gallstones, gallbladder wall thickening, or biliary dilatation. Pancreas: Unremarkable. No pancreatic ductal dilatation or surrounding inflammatory changes. Spleen: Normal in size without focal abnormality. Adrenals/Urinary Tract: Adrenal glands are normal. 5 mm left kidney stone. Urinary bladder is unremarkable. Kidneys show no hydronephrosis Stomach/Bowel: Stomach nonenlarged. No dilated small bowel. No acute bowel wall thickening. Negative appendix. Vascular/Lymphatic: Nonaneurysmal aorta.  No suspicious lymph nodes Reproductive: Negative prostate. Other: Negative for free air or free fluid. Musculoskeletal: Chronic compression deformity at T10 IMPRESSION: 1. Large circumferential pericardial effusion measuring up to 2.5 cm. 2. No CT evidence for acute intra-abdominal or pelvic abnormality. 3. Nonobstructing left kidney stone. Electronically Signed   By: Jasmine Pang M.D.   On: 06/07/2022 23:33   DG Chest 1 View  Result Date: 06/07/2022 CLINICAL DATA:  Chest pain EXAM: CHEST  1 VIEW COMPARISON:  04/18/2022 FINDINGS: Stable cardiomegaly without acute airspace process, pneumonia, edema, effusion or pneumothorax. Trachea midline. No acute osseous finding. IMPRESSION: Cardiomegaly  without acute process. Electronically Signed   By: Judie Petit.  Shick M.D.   On: 06/07/2022 14:35     Labs:   Basic Metabolic Panel: Recent Labs  Lab 06/11/22 0120 06/12/22 0135 06/13/22 0130 06/14/22 0850 06/15/22 0130  NA 131* 131* 129* 139 137  K 3.7 4.3 3.9 4.1 3.9  CL 98 99 103 106 106  CO2 26 20* 21* 26 23  GLUCOSE 93 118* 119* 105* 84  BUN 16 15 21* 19 19  CREATININE 1.60* 1.32* 1.76* 1.47* 1.42*  CALCIUM 10.4* 10.5* 10.5* 10.0 10.5*  MG 2.3  --   --   --   --    GFR Estimated Creatinine Clearance: 61.5 mL/min (A) (by C-G formula based on SCr of 1.42 mg/dL (H)). Liver Function Tests: Recent Labs  Lab 06/11/22 0120 06/12/22 0135 06/13/22 0130  AST 66* 79* 62*  ALT ALKPHOS 40 43 48  BILITOT 0.8 0.7 0.4  PROT 6.7 7.4 6.9  ALBUMIN 3.8 4.3 3.6   No results for input(s): "LIPASE", "AMYLASE" in the last 168 hours. No results for input(s): "AMMONIA" in the last 168 hours. Coagulation profile No results for input(s): "INR", "PROTIME" in the last 168 hours.  CBC: Recent Labs  Lab 06/11/22 0120 06/12/22 0135 06/13/22 0130 06/14/22 0850 06/15/22 0130  WBC 4.3 7.7 14.1* 9.4 8.7  NEUTROABS  --  6.7 12.3*  --  5.3  HGB 11.8* 14.1 13.3 13.2 12.8*  HCT 34.2* 40.5 38.1* 41.0 38.0*  MCV 95.0 93.8 93.6 99.0 96.4  PLT 219 233 256 258 238   Cardiac Enzymes: Recent Labs  Lab 06/09/22 0036 06/15/22 0130  CKTOTAL 1,171* 733*   BNP: Invalid input(s): "POCBNP" CBG: No results for input(s): "GLUCAP" in the last 168 hours. D-Dimer No results for input(s): "DDIMER" in the last 72 hours. Hgb A1c No results for input(s): "HGBA1C" in the last 72 hours. Lipid Profile No results for input(s): "CHOL", "HDL", "  LDLCALC", "TRIG", "CHOLHDL", "LDLDIRECT" in the last 72 hours. Thyroid function studies No results for input(s): "TSH", "T4TOTAL", "T3FREE", "THYROIDAB" in the last 72 hours.  Invalid input(s): "FREET3" Anemia work up No results for input(s): "VITAMINB12",  "FOLATE", "FERRITIN", "TIBC", "IRON", "RETICCTPCT" in the last 72 hours. Microbiology Recent Results (from the past 240 hour(s))  SARS Coronavirus 2 by RT PCR (hospital order, performed in [facility] hospital lab) *cepheid single result test* Anterior Nasal Swab     Status: None   Collection Time: 06/11/22  7:22 AM   Specimen: Anterior Nasal Swab  Result Value Ref Range Status   SARS Coronavirus 2 by RT PCR NEGATIVE NEGATIVE Final    Comment: Performed at [facility], [address], [place], [place] [address]    Time coordinating discharge: 45 minutes  Signed: Edeline Greening  [facility] 06/15/2022, 10:54 AM

## 2022-06-18 ENCOUNTER — Telehealth: Payer: Self-pay

## 2022-06-18 NOTE — Transitions of Care (Post Inpatient/ED Visit) (Signed)
   06/18/2022  Name: Christopher Lara MRN: [id number] DOB: Jul 13, 1975  Today's TOC FU Call Status: Today's TOC FU Call Status:: Successful TOC FU Call Competed TOC FU Call Complete Date: 06/18/22  Transition Care Management Follow-up Telephone Call Discharge Facility: [facility]) Type of Discharge: Inpatient Admission Primary Inpatient Discharge Diagnosis:: Pericardial Effusion How have you been since you were released from the hospital?: Better Any questions or concerns?: Yes Patient Questions/Concerns:: Patient concerned about the diarrhea from his new medication (Colchicine) Patient Questions/Concerns Addressed: Other: (Discuseed Immodium and he often he should take it)  Items Reviewed: Did you receive and understand the discharge instructions provided?: Yes Medications obtained and verified?: Yes (Medications Reviewed) Any new allergies since your discharge?: No Dietary orders reviewed?: Yes Type of Diet Ordered:: Heart Healthy Do you have support at home?: Yes People in Home: spouse Name of Support/Comfort Primary Source: [facility] and Equipment/Supplies: Were Home Health Services Ordered?: No Any new equipment or medical supplies ordered?: No  Functional Questionnaire: Do you need assistance with bathing/showering or dressing?: No Do you need assistance with meal preparation?: No Do you need assistance with eating?: No Do you have difficulty maintaining continence: No Do you need assistance with getting out of bed/getting out of a chair/moving?: No Do you have difficulty managing or taking your medications?: No  Follow up appointments reviewed: PCP Follow-up appointment confirmed?: Yes Date of PCP follow-up appointment?: 06/21/22 Follow-up Provider: Dr. Andrey Campanile Specialist [facility] Follow-up appointment confirmed?: Yes Date of Specialist follow-up appointment?: 06/20/22 Follow-Up Specialty Provider:: Dr. Lisabeth Devoid Do you need transportation to your follow-up  appointment?: No Do you understand care options if your condition(s) worsen?: Yes-patient verbalized understanding  SDOH Interventions Today    Flowsheet Row Most Recent Value  SDOH Interventions   Food Insecurity Interventions Intervention Not Indicated  Transportation Interventions Intervention Not Indicated      Interventions Today    Flowsheet Row Most Recent Value  Chronic Disease   Chronic disease during today's visit Other  [Pericardial effusion]  General Interventions   General Interventions Discussed/Reviewed General Interventions Discussed, Doctor Visits  Doctor Visits Discussed/Reviewed Doctor Visits Reviewed  Pharmacy Interventions   Pharmacy Dicussed/Reviewed Pharmacy Topics Discussed, Medications and their functions       Jodelle Gross, RN, BSN, CCM Care Management Coordinator [facility] Phone: [phone]/Fax: [phone]

## 2022-06-20 ENCOUNTER — Ambulatory Visit: Payer: Federal, State, Local not specified - PPO | Attending: Physician Assistant | Admitting: Physician Assistant

## 2022-06-20 ENCOUNTER — Encounter: Payer: Self-pay | Admitting: Physician Assistant

## 2022-06-20 VITALS — BP 128/76 | HR 60 | Ht 68.0 in | Wt 177.4 lb

## 2022-06-20 DIAGNOSIS — N179 Acute kidney failure, unspecified: Secondary | ICD-10-CM

## 2022-06-20 DIAGNOSIS — Z9889 Other specified postprocedural states: Secondary | ICD-10-CM | POA: Diagnosis not present

## 2022-06-20 NOTE — Patient Instructions (Addendum)
Medication Instructions:   Your physician recommends that you continue on your current medications as directed. Please refer to the Current Medication list given to you today.  *If you need a refill on your cardiac medications before your next appointment, please call your pharmacy*  Lab Work: Your physician recommends that you have lab work TODAY:  BMP  If you have labs (blood work) drawn today and your tests are completely normal, you will receive your results only by: MyChart Message (if you have MyChart) OR A paper copy in the mail If you have any lab test that is abnormal or we need to change your treatment, we will call you to review the results.  Testing/Procedures: NONE ordered at this time of appointment   Follow-Up: At [facility], you and your health needs are our priority.  As part of our continuing mission to provide you with exceptional heart care, we have created designated Provider Care Teams.  These Care Teams include your primary Cardiologist (physician) and Advanced Practice Providers (APPs -  Physician Assistants and Nurse Practitioners) who all work together to provide you with the care you need, when you need it.  Your next appointment:   As previously scheduled    Provider:   Peter Swaziland, MD    Dr. Cliffton Asters Other Instructions

## 2022-06-20 NOTE — Progress Notes (Signed)
Cardiology Office Note:    Date:  06/22/2022   ID:  Christopher Lara, DOB 03-08-1975, MRN [id number]  PCP:  Georganna Skeans, MD   [facility] Providers Cardiologist:  Peter Swaziland, MD     Referring MD: Georganna Skeans, MD   Chief Complaint  Patient presents with   Follow-up    Seen for Dr. Swaziland    History of Present Illness:    Christopher Lara is a 47 y.o. male with a hx of severe hyponatremia in the setting of COVID-pneumonia in January 2021.  He was seen at [facility] ED in December 2022 for syncopal episode that occurred after he took a hot shower.  He developed tunnel vision and a sensation that things were going dark before passed out and hit his face in the fall.  He was prescribed phosphate supplement and recommend outpatient follow-up with PCP. He did have COVID again on 04/12/2022 and was treated with Paxlovid. Patient was admitted recently in February 2024 was severe hyponatremia after presenting with altered mental status.  He was found to have a large pericardial effusion for which cardiology service was consulted.  Upon arrival, he was hypertensive.  EKG showed sinus rhythm with nonspecific T wave changes and a prolonged QTc of 523 ms.  Serial troponin negative.  Sodium markedly low at 105.  CK significantly elevated to 15,000 consistent with rhabdomyolysis.  BNP normal.  He was treated with hypertonic saline.  Echocardiogram showed EF 70 to 75%, grade 2 DD, large pericardial effusion measuring up to 3.85 cm with mild RV diastolic collapse and the respiratory flow variation across the tricuspid valve.  Findings not consistent with cardiac tamponade.  Sodium improved to 120.  Patient eventually underwent pericardiocentesis with removal of 1 L of fluid on 04/19/2022. Initial analysis consistent with transudative effusion.  Limited echocardiogram obtained on 04/19/2022 demonstrated EF 65 to 70%, trivial pericardial effusion.   I last saw the patient on 04/24/2018 for along with  his wife, he was doing well at the time.  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  I recommended repeat echocardiogram in 4 weeks.  Limited echocardiogram obtained on 05/23/2022 showed EF 60 to 65%, large pericardial effusion.  Patient was seen by Christopher Person NP on 06/05/2022, he was referred to CT surgery.  Unfortunately prior to him seeing CT surgery, he was admitted to the hospital on 06/07/2022 with chest pain.  Urgent bedside echocardiogram showed EF 60 to 65%, normal RV size and function, large pericardial effusion that is circumferential with sprain cardiac motion, no evidence of RV diastolic collapse.  ESR and CRP were elevated.  He was placed on colchicine, he initially had diarrhea and AKI, this improved with Imodium.  Patient was seen by Dr. Cliffton Asters of CT surgery and eventually underwent robotic assisted pericardial window on 06/11/2022 by Dr. Cliffton Asters.  Chest tube was eventually removed on 4/18.  Subsequent chest x-ray was unremarkable.  It was recommended he continue colchicine for total of 3 months.     Past Medical History:  Diagnosis Date   Kidney stone    Kidney stone    Kidney stones    Kidney stones     Past Surgical History:  Procedure Laterality Date   PERICARDIOCENTESIS N/A 04/19/2022   Procedure: PERICARDIOCENTESIS;  Surgeon: Kathleene Hazel, MD;  [facility] CV LAB;  Service: Cardiovascular;  Laterality: N/A;   XI ROBOTIC ASSISTED PERICARDIAL WINDOW Right 06/11/2022   Procedure: XI ROBOTIC ASSISTED THORACOSCOPY  PERICARDIAL WINDOW;  Surgeon: Corliss Skains, MD;  Location: [facility] OR;  Service: Thoracic;  Laterality: Right;    Current Medications: Current Meds  Medication Sig   colchicine 0.6 MG tablet Take 1 tablet (0.6 mg total) by mouth 2 (two) times daily.   loperamide (IMODIUM) 2 MG capsule Take 1 capsule (2 mg total) by mouth every 6 (six) hours as needed for diarrhea or loose stools.     Allergies:   Patient has no known allergies.   Social  History   Socioeconomic History   Marital status: Married    Spouse name: Not on file   Number of children: Not on file   Years of education: Not on file   Highest education level: Not on file  Occupational History   Not on file  Tobacco Use   Smoking status: Never    Passive exposure: Never   Smokeless tobacco: Never  Substance and Sexual Activity   Alcohol use: No   Drug use: No   Sexual activity: Not on file  Other Topics Concern   Not on file  Social History Narrative   Not on file   Social Determinants of Health   Financial Resource Strain: Not on file  Food Insecurity: No Food Insecurity (06/18/2022)   Hunger Vital Sign    Worried About Running Out of Food in the Last Year: Never true    Ran Out of Food in the Last Year: Never true  Transportation Needs: No Transportation Needs (06/18/2022)   PRAPARE - Administrator, Civil Service (Medical): No    Lack of Transportation (Non-Medical): No  Physical Activity: Not on file  Stress: Not on file  Social Connections: Not on file     Family History: The patient's family history includes Cancer in his brother; Heart attack in his father; Heart failure in his father.  ROS:   Please see the history of present illness.     All other systems reviewed and are negative.  EKGs/Labs/Other Studies Reviewed:    The following studies were reviewed today:  Echo 06/08/2022  1. Large pericardial effusion. The pericardial effusion is  circumferential.   Conclusion(s)/Recommendation(s): Large pericardial effusion with RV  collapse during diastole, borderline + inflow velocities; however the IVC  collapses. Borderline echo features for tamponade. Discussed with primary  team.   EKG:  EKG is not ordered today.   Recent Labs: 06/07/2022: B Natriuretic Peptide 34.3 06/11/2022: Magnesium 2.3 06/13/2022: ALT 25 06/15/2022: Hemoglobin 12.8; Platelets 238 06/20/2022: BUN 14; Creatinine, Ser 1.20; Potassium 4.2; Sodium 134   Recent Lipid Panel No results found for: "CHOL", "TRIG", "HDL", "CHOLHDL", "VLDL", "LDLCALC", "LDLDIRECT"   Risk Assessment/Calculations:           Physical Exam:    VS:  BP 128/76   Pulse 60   Ht 5\' 8"  (1.727 m)   Wt 177 lb 6.4 oz (80.5 kg)   SpO2 97%   BMI 26.97 kg/m         Wt Readings from Last 3 Encounters:  06/22/22 177 lb (80.3 kg)  06/21/22 177 lb 12.8 oz (80.6 kg)  06/20/22 177 lb 6.4 oz (80.5 kg)     GEN:  Well nourished, well developed in no acute distress HEENT: Normal NECK: No JVD; No carotid bruits LYMPHATICS: No lymphadenopathy CARDIAC: RRR, no murmurs, rubs, gallops RESPIRATORY:  Clear to auscultation without rales, wheezing or rhonchi  ABDOMEN: Soft, non-tender, non-distended MUSCULOSKELETAL:  No edema; No deformity  SKIN: Warm and  dry NEUROLOGIC:  Alert and oriented x 3 PSYCHIATRIC:  Normal affect   ASSESSMENT:    1. S/P pericardial window creation   2. AKI (acute kidney injury) (HCC)    PLAN:    In order of problems listed above:  History of large pericardial effusion s/p pericardial window: Symptom improved after pericardial window creation.  Patient may return to work this Saturday.  Continue colchicine.  He does have diarrhea was 0.6 mg twice a day of colchicine, may consider reduce colchicine to daily dosing after 1 month.  He will need a total 21-month of colchicine treatment.  AKI: Obtain basic metabolic panel.           Medication Adjustments/Labs and Tests Ordered: Current medicines are reviewed at length with the patient today.  Concerns regarding medicines are outlined above.  Orders Placed This Encounter  Procedures   Basic metabolic panel   No orders of the defined types were placed in this encounter.   Patient Instructions  Medication Instructions:   Your physician recommends that you continue on your current medications as directed. Please refer to the Current Medication list given to you today.  *If you need a  refill on your cardiac medications before your next appointment, please call your pharmacy*  Lab Work: Your physician recommends that you have lab work TODAY:  BMP  If you have labs (blood work) drawn today and your tests are completely normal, you will receive your results only by: MyChart Message (if you have MyChart) OR A paper copy in the mail If you have any lab test that is abnormal or we need to change your treatment, we will call you to review the results.  Testing/Procedures: NONE ordered at this time of appointment   Follow-Up: At [facility], you and your health needs are our priority.  As part of our continuing mission to provide you with exceptional heart care, we have created designated Provider Care Teams.  These Care Teams include your primary Cardiologist (physician) and Advanced Practice Providers (APPs -  Physician Assistants and Nurse Practitioners) who all work together to provide you with the care you need, when you need it.  Your next appointment:   As previously scheduled    Provider:   Peter Swaziland, MD    Dr. Cliffton Asters Other Instructions     Signed, Azalee Course, PA  06/22/2022 10:52 PM    [facility]

## 2022-06-21 ENCOUNTER — Telehealth: Payer: Self-pay | Admitting: Cardiology

## 2022-06-21 ENCOUNTER — Encounter: Payer: Self-pay | Admitting: Family Medicine

## 2022-06-21 ENCOUNTER — Ambulatory Visit (INDEPENDENT_AMBULATORY_CARE_PROVIDER_SITE_OTHER): Payer: Federal, State, Local not specified - PPO | Admitting: Family Medicine

## 2022-06-21 VITALS — BP 107/73 | HR 78 | Temp 98.1°F | Resp 16 | Wt 177.8 lb

## 2022-06-21 DIAGNOSIS — I3139 Other pericardial effusion (noninflammatory): Secondary | ICD-10-CM | POA: Diagnosis not present

## 2022-06-21 DIAGNOSIS — Z1211 Encounter for screening for malignant neoplasm of colon: Secondary | ICD-10-CM

## 2022-06-21 DIAGNOSIS — Z09 Encounter for follow-up examination after completed treatment for conditions other than malignant neoplasm: Secondary | ICD-10-CM

## 2022-06-21 DIAGNOSIS — Z0279 Encounter for issue of other medical certificate: Secondary | ICD-10-CM

## 2022-06-21 LAB — BASIC METABOLIC PANEL
BUN/Creatinine Ratio: 12 (ref 9–20)
BUN: 14 mg/dL (ref 6–24)
CO2: 19 mmol/L — ABNORMAL LOW (ref 20–29)
Calcium: 10.7 mg/dL — ABNORMAL HIGH (ref 8.7–10.2)
Chloride: 96 mmol/L (ref 96–106)
Creatinine, Ser: 1.2 mg/dL (ref 0.76–1.27)
Glucose: 68 mg/dL — ABNORMAL LOW (ref 70–99)
Potassium: 4.2 mmol/L (ref 3.5–5.2)
Sodium: 134 mmol/L (ref 134–144)
eGFR: 75 mL/min/{1.73_m2} (ref >59)

## 2022-06-21 NOTE — Progress Notes (Signed)
Stable renal function and electrolyte

## 2022-06-21 NOTE — Telephone Encounter (Signed)
06/21/22  Unable to leave VM, mailbox full Patient forms completed by Azalee Course, PA.  Patient will need to come into [facility] office to pay $29 forms fee (cash, check, or money order) and sign release.

## 2022-06-22 ENCOUNTER — Encounter: Payer: Self-pay | Admitting: Physician Assistant

## 2022-06-22 ENCOUNTER — Encounter: Payer: Self-pay | Admitting: Thoracic Surgery (Cardiothoracic Vascular Surgery)

## 2022-06-22 ENCOUNTER — Ambulatory Visit (INDEPENDENT_AMBULATORY_CARE_PROVIDER_SITE_OTHER): Payer: Self-pay | Admitting: Thoracic Surgery (Cardiothoracic Vascular Surgery)

## 2022-06-22 VITALS — BP 117/80 | HR 74 | Resp 20 | Ht 68.0 in | Wt 177.0 lb

## 2022-06-22 DIAGNOSIS — I3139 Other pericardial effusion (noninflammatory): Secondary | ICD-10-CM

## 2022-06-22 NOTE — Progress Notes (Signed)
      [address]       [place] [address]             [phone]        Christopher Lara [facility] Medical Record #[id number] Date of Birth: 1975-04-10  Referring: Parke Poisson, MD Primary Care: Georganna Skeans, MD Primary Cardiologist:Peter Swaziland, MD  Reason for visit:   follow-up  History of Present Illness:     47yo male presents for his 1st follow-up after undergoing a pericardial window.    Physical Exam: There were no vitals taken for this visit.  Alert NAD Incision clean.   Abdomen, ND no peripheral edema   Diagnostic Studies & Laboratory data:  Path:  FINAL MICROSCOPIC DIAGNOSIS:   A. PERICARDIUM, BIOPSY:       Benign pericardial tissue with focal mild fibrosis and reactive  changes.      Negative for malignancy.       Assessment / Plan:   47yo male s/p R RATS, pericardial window.  Overall doing well.  He will f/u in 1 month with a CXR   Christopher Lara 06/22/2022 10:19 AM

## 2022-06-26 ENCOUNTER — Encounter: Payer: Self-pay | Admitting: Family Medicine

## 2022-06-26 NOTE — Progress Notes (Signed)
Established Patient Office Visit  Subjective    Patient ID: Christopher Lara, male    DOB: 1975/04/20  Age: 47 y.o. MRN: [id number]  CC: No chief complaint on file.   HPI NASIIR MONTS presents for follow up of recent hospital visit with dx of pericardial effusion. Patient reports improvement since discharge.    Outpatient Encounter Medications as of 06/21/2022  Medication Sig   colchicine 0.6 MG tablet Take 1 tablet (0.6 mg total) by mouth 2 (two) times daily.   loperamide (IMODIUM) 2 MG capsule Take 1 capsule (2 mg total) by mouth every 6 (six) hours as needed for diarrhea or loose stools.   [DISCONTINUED] omeprazole (PRILOSEC) 20 MG capsule Take 1 capsule (20 mg total) by mouth daily. 30 minutes prior to meal. (Patient not taking: Reported on 10/01/2014)   No facility-administered encounter medications on file as of 06/21/2022.    Past Medical History:  Diagnosis Date   Kidney stone    Kidney stone    Kidney stones    Kidney stones     Past Surgical History:  Procedure Laterality Date   PERICARDIOCENTESIS N/A 04/19/2022   Procedure: PERICARDIOCENTESIS;  Surgeon: Kathleene Hazel, MD;  Location: [facility] INVASIVE CV LAB;  Service: Cardiovascular;  Laterality: N/A;   XI ROBOTIC ASSISTED PERICARDIAL WINDOW Right 06/11/2022   Procedure: XI ROBOTIC ASSISTED THORACOSCOPY PERICARDIAL WINDOW;  Surgeon: Corliss Skains, MD;  Location: [facility] OR;  Service: Thoracic;  Laterality: Right;    Family History  Problem Relation Age of Onset   Heart failure Father    Heart attack Father    Cancer Brother     Social History   Socioeconomic History   Marital status: Married    Spouse name: Not on file   Number of children: Not on file   Years of education: Not on file   Highest education level: Not on file  Occupational History   Not on file  Tobacco Use   Smoking status: Never    Passive exposure: Never   Smokeless tobacco: Never  Substance and Sexual Activity   Alcohol use:  No   Drug use: No   Sexual activity: Not on file  Other Topics Concern   Not on file  Social History Narrative   Not on file   Social Determinants of Health   Financial Resource Strain: Not on file  Food Insecurity: No Food Insecurity (06/18/2022)   Hunger Vital Sign    Worried About Running Out of Food in the Last Year: Never true    Ran Out of Food in the Last Year: Never true  Transportation Needs: No Transportation Needs (06/18/2022)   PRAPARE - Administrator, Civil Service (Medical): No    Lack of Transportation (Non-Medical): No  Physical Activity: Not on file  Stress: Not on file  Social Connections: Not on file  Intimate Partner Violence: Not At Risk (06/07/2022)   Humiliation, Afraid, Rape, and Kick questionnaire    Fear of Current or Ex-Partner: No    Emotionally Abused: No    Physically Abused: No    Sexually Abused: No    Review of Systems  All other systems reviewed and are negative.       Objective    BP 107/73   Pulse 78   Temp 98.1 F (36.7 C) (Oral)   Resp 16   Wt 177 lb 12.8 oz (80.6 kg)   SpO2 98%   BMI 27.03 kg/m   Physical  Exam Vitals and nursing note reviewed.  Constitutional:      General: He is not in acute distress. Cardiovascular:     Rate and Rhythm: Normal rate and regular rhythm.  Pulmonary:     Effort: Pulmonary effort is normal.     Breath sounds: Normal breath sounds.  Abdominal:     Palpations: Abdomen is soft.     Tenderness: There is no abdominal tenderness.  Neurological:     General: No focal deficit present.     Mental Status: He is alert and oriented to person, place, and time.         Assessment & Plan:   1. Pericardial effusion Improving. Management as per consultant  2. Screening for colon cancer  - Cologuard  3. Hospital discharge follow-up     No follow-ups on file.   Tommie Raymond, MD

## 2022-07-03 NOTE — Telephone Encounter (Signed)
07/03/22 VM left for patient, forms were left at [facility] front office pick up bin.  If patient would like forms to be faxed, office needs fax number so we can fax forms.    06/29/22 Patient pd forms fee cash $29.

## 2022-07-04 NOTE — Telephone Encounter (Signed)
07/04/22   Pt came into office 5/7 to pick up forms, pt was not given completed forms.  Left message for pt to ask for paperwork that was left at front desk pick up bin.

## 2022-07-05 ENCOUNTER — Telehealth: Payer: Self-pay | Admitting: Cardiology

## 2022-07-05 NOTE — Telephone Encounter (Signed)
Patient states since starting colchiceine he has had sore muscles and tightness.  He states it is getting worse.  Takes Twice a day and is to take through July.  States mainly in the legs and slightly in the arms. No other medications he is taking and started with the start of this med.  Please advise

## 2022-07-05 NOTE — Telephone Encounter (Signed)
Pt c/o medication issue:  1. Name of Medication: colchicine 0.6 MG tablet   2. How are you currently taking this medication (dosage and times per day)? Take 1 tablet (0.6 mg total) by mouth 2 (two) times daily.   3. Are you having a reaction (difficulty breathing--STAT)? No  4. What is your medication issue? Patient stated that he thinks this medicaion is making his muscles sore and tight. Patient stated they started this medication around April 12th. Please advise.

## 2022-07-05 NOTE — Telephone Encounter (Signed)
Patient aware of provider recommendations. He verbalized understanding. No questions at this time.

## 2022-07-12 ENCOUNTER — Other Ambulatory Visit: Payer: Self-pay | Admitting: Family Medicine

## 2022-07-12 DIAGNOSIS — R700 Elevated erythrocyte sedimentation rate: Secondary | ICD-10-CM

## 2022-07-13 LAB — COLOGUARD: COLOGUARD: NEGATIVE

## 2022-07-19 ENCOUNTER — Other Ambulatory Visit: Payer: Self-pay | Admitting: Thoracic Surgery (Cardiothoracic Vascular Surgery)

## 2022-07-19 DIAGNOSIS — Z9889 Other specified postprocedural states: Secondary | ICD-10-CM

## 2022-07-20 ENCOUNTER — Ambulatory Visit
Admission: RE | Admit: 2022-07-20 | Discharge: 2022-07-20 | Disposition: A | Discharge status: Home / Self Care | Payer: Federal, State, Local not specified - PPO | Source: Ambulatory Visit | Attending: Thoracic Surgery (Cardiothoracic Vascular Surgery) | Admitting: Thoracic Surgery (Cardiothoracic Vascular Surgery)

## 2022-07-20 ENCOUNTER — Ambulatory Visit (INDEPENDENT_AMBULATORY_CARE_PROVIDER_SITE_OTHER): Payer: Self-pay | Admitting: Thoracic Surgery (Cardiothoracic Vascular Surgery)

## 2022-07-20 ENCOUNTER — Encounter: Payer: Self-pay | Admitting: Thoracic Surgery (Cardiothoracic Vascular Surgery)

## 2022-07-20 ENCOUNTER — Ambulatory Visit: Payer: Self-pay | Admitting: Thoracic Surgery (Cardiothoracic Vascular Surgery)

## 2022-07-20 VITALS — BP 117/79 | HR 69 | Resp 20 | Wt 175.0 lb

## 2022-07-20 DIAGNOSIS — Z9889 Other specified postprocedural states: Secondary | ICD-10-CM

## 2022-07-20 DIAGNOSIS — Z09 Encounter for follow-up examination after completed treatment for conditions other than malignant neoplasm: Secondary | ICD-10-CM | POA: Diagnosis not present

## 2022-07-20 NOTE — Progress Notes (Deleted)
      [address]       [place] [address]             [phone]        Christopher Lara [facility] Medical Record #[id number] Date of Birth: 1975-05-22  Referring: Parke Poisson, MD Primary Care: Georganna Skeans, MD Primary Cardiologist:Peter Swaziland, MD  Reason for visit:   follow-up  History of Present Illness:     47yo male presents for his 1 month follow-up appointment.  ***  Physical Exam: There were no vitals taken for this visit.  Alert NAD Incision clean.   Abdomen, ND no peripheral edema   Diagnostic Studies & Laboratory data: CXR: ***     Assessment / Plan:   47yo male s/p RATS pericardial window.  Overall doing well.  He will follow-up as needed.  ***   Christopher Lara 07/20/2022 8:47 AM

## 2022-07-26 NOTE — Telephone Encounter (Signed)
Payment posted to patient acct- COMPLETED.

## 2022-07-27 NOTE — Progress Notes (Signed)
      [address]       [place] [address]             [phone]        ARTA BAZ [facility] Medical Record #[id number] Date of Birth: 05-24-1975  Referring: Parke Poisson, MD Primary Care: Georganna Skeans, MD Primary Cardiologist:Peter Swaziland, MD  Reason for visit:   follow-up  History of Present Illness:     47 year old male presents for follow-up.  He has no complaints.  He denies any shortness of breath.  Physical Exam: BP 117/79 (BP Location: Right Arm, Patient Position: Sitting, Cuff Size: Normal)   Pulse 69   Resp 20   Wt 175 lb (79.4 kg)   SpO2 99% Comment: RA  BMI 26.61 kg/m   Alert NAD Incision clean.   Abdomen, ND No peripheral edema   Diagnostic Studies & Laboratory data: CXR: Clear     Assessment / Plan:   47 year old male status post robotic assisted pericardial window.  Overall he is doing well.  He will follow-up as needed.   Corliss Skains 07/27/2022 12:49 PM

## 2022-08-18 NOTE — Progress Notes (Unsigned)
Cardiology Office Note:    Date:  08/21/2022   ID:  Christopher Lara, DOB 01-03-1976, MRN [id number]  PCP:  Georganna Skeans, MD   [facility] Providers Cardiologist:  Maxemiliano Riel [place], MD     Referring MD: No ref. provider found   Chief Complaint  Patient presents with   Pericardial Effusion    History of Present Illness:    Christopher Lara is a 47 y.o. male with a hx of severe hyponatremia in the setting of COVID-pneumonia in January 2021.  He was seen at [facility] ED in December 2022 for syncopal episode that occurred after he took a hot shower.  He developed tunnel vision and a sensation that things were going dark before passed out and hit his face in the fall.  Patient was admitted recently in February 2024 was severe hyponatremia after presenting with altered mental status.  He was found to have a large pericardial effusion for which cardiology service was consulted.  Upon arrival, he was hypertensive.  EKG showed sinus rhythm with nonspecific T wave changes and a prolonged QTc of 523 ms.  Serial troponin negative.  Sodium markedly low at 105.  CK significantly elevated to 15,000 consistent with rhabdomyolysis.  BNP normal.  He was treated with hypertonic saline.  Echocardiogram showed EF 70 to 75%, grade 2 DD, large pericardial effusion measuring up to 3.85 cm with mild RV diastolic collapse and the respiratory flow variation across the tricuspid valve.  Findings not consistent with cardiac tamponade.  Sodium improved to 120.  Patient eventually underwent pericardiocentesis with removal of 1 L of fluid on 04/19/2022. Initial analysis consistent with transudative effusion.  Limited echocardiogram obtained on 04/19/2022 demonstrated EF 65 to 70%, trivial pericardial effusion.   On follow up  Limited echocardiogram obtained on 05/23/2022 showed EF 60 to 65%, large pericardial effusion.  Patient was seen by Bernadene Person NP on 06/05/2022, he was referred to CT surgery.  Unfortunately prior to  him seeing CT surgery, he was admitted to the hospital on 06/07/2022 with chest pain.  Urgent bedside echocardiogram showed EF 60 to 65%, normal RV size and function, large pericardial effusion that is circumferential,no evidence of RV diastolic collapse.  ESR and CRP were elevated.  He was placed on colchicine, he initially had diarrhea and AKI, this improved with Imodium.  Patient was seen by Dr. Cliffton Asters of CT surgery and eventually underwent robotic assisted pericardial window on 06/11/2022 by Dr. Cliffton Asters.  Chest tube was eventually removed on 4/18.  Subsequent chest x-ray was unremarkable.  It was recommended he continue colchicine for total of 3 months.  He was last seen in follow up on 06/20/22. BMET showed improvement in creatinine to 1.2.  On follow up today he is doing very well. No dyspnea or chest pain. Is back to normal activity and work. No edema. No palpitations   Past Medical History:  Diagnosis Date   Kidney stone    Kidney stone    Kidney stones    Kidney stones     Past Surgical History:  Procedure Laterality Date   PERICARDIOCENTESIS N/A 04/19/2022   Procedure: PERICARDIOCENTESIS;  Surgeon: Kathleene Hazel, MD;  Location: [facility] INVASIVE CV LAB;  Service: Cardiovascular;  Laterality: N/A;   XI ROBOTIC ASSISTED PERICARDIAL WINDOW Right 06/11/2022   Procedure: XI ROBOTIC ASSISTED THORACOSCOPY PERICARDIAL WINDOW;  Surgeon: Corliss Skains, MD;  Location: [facility] OR;  Service: Thoracic;  Laterality: Right;    Current Medications: Current Meds  Medication Sig   colchicine 0.6 MG tablet Take 1 tablet (0.6 mg total) by mouth 2 (two) times daily. (Patient taking differently: Take 0.6 mg by mouth daily.)     Allergies:   Patient has no known allergies.   Social History   Socioeconomic History   Marital status: Married    Spouse name: Not on file   Number of children: Not on file   Years of education: Not on file   Highest education level: Not on file  Occupational  History   Not on file  Tobacco Use   Smoking status: Never    Passive exposure: Never   Smokeless tobacco: Never  Substance and Sexual Activity   Alcohol use: No   Drug use: No   Sexual activity: Not on file  Other Topics Concern   Not on file  Social History Narrative   Not on file   Social Determinants of Health   Financial Resource Strain: Not on file  Food Insecurity: No Food Insecurity (06/18/2022)   Hunger Vital Sign    Worried About Running Out of Food in the Last Year: Never true    Ran Out of Food in the Last Year: Never true  Transportation Needs: No Transportation Needs (06/18/2022)   PRAPARE - Administrator, Civil Service (Medical): No    Lack of Transportation (Non-Medical): No  Physical Activity: Not on file  Stress: Not on file  Social Connections: Not on file     Family History: The patient's family history includes Cancer in his brother; Heart attack in his father; Heart failure in his father.  ROS:   Please see the history of present illness.     All other systems reviewed and are negative.  EKGs/Labs/Other Studies Reviewed:    The following studies were reviewed today:  Echo 06/08/2022  1. Large pericardial effusion. The pericardial effusion is  circumferential.   Conclusion(s)/Recommendation(s): Large pericardial effusion with RV  collapse during diastole, borderline + inflow velocities; however the IVC  collapses. Borderline echo features for tamponade. Discussed with primary  team.   EKG:  EKG is not ordered today.   Recent Labs: 06/07/2022: B Natriuretic Peptide 34.3 06/11/2022: Magnesium 2.3 06/13/2022: ALT 25 06/15/2022: Hemoglobin 12.8; Platelets 238 06/20/2022: BUN 14; Creatinine, Ser 1.20; Potassium 4.2; Sodium 134  Recent Lipid Panel No results found for: "CHOL", "TRIG", "HDL", "CHOLHDL", "VLDL", "LDLCALC", "LDLDIRECT"   Risk Assessment/Calculations:           Physical Exam:    VS:  BP 112/82   Pulse 71   Ht 5'  9" (1.753 m)   Wt 177 lb 9.6 oz (80.6 kg)   SpO2 99%   BMI 26.23 kg/m         Wt Readings from Last 3 Encounters:  08/21/22 177 lb 9.6 oz (80.6 kg)  07/20/22 175 lb (79.4 kg)  06/22/22 177 lb (80.3 kg)     GEN:  Well nourished, well developed in no acute distress HEENT: Normal NECK: No JVD; No carotid bruits LYMPHATICS: No lymphadenopathy CARDIAC: RRR, no murmurs, rubs, gallops RESPIRATORY:  Clear to auscultation without rales, wheezing or rhonchi  ABDOMEN: Soft, non-tender, non-distended MUSCULOSKELETAL:  No edema; No deformity  SKIN: Warm and dry NEUROLOGIC:  Alert and oriented x 3 PSYCHIATRIC:  Normal affect   ASSESSMENT:    1. Pericardial effusion   2. S/P pericardial window creation   3. Hyponatremia     PLAN:    In order of problems listed  above:  History of large pericardial effusion s/p pericardial window: Symptom improved after pericardial window.  Will plan on discontinuing  colchicine in 2 weeks which will be 3 months since surgery.  No further work up or follow up needed. Suspect this was all related to post viral/Covid. Can follow up PRN  AKI: resolved.  3.   Hyponatremia. corrected           Medication Adjustments/Labs and Tests Ordered: Current medicines are reviewed at length with the patient today.  Concerns regarding medicines are outlined above.  No orders of the defined types were placed in this encounter.  No orders of the defined types were placed in this encounter.   There are no Patient Instructions on file for this visit.   Signed, Ulyses Panico [place], MD  08/21/2022 9:11 AM     [facility]

## 2022-08-21 ENCOUNTER — Ambulatory Visit: Payer: Federal, State, Local not specified - PPO | Attending: Cardiology | Admitting: Cardiology

## 2022-08-21 ENCOUNTER — Encounter: Payer: Self-pay | Admitting: Cardiology

## 2022-08-21 VITALS — BP 112/82 | HR 71 | Ht 69.0 in | Wt 177.6 lb

## 2022-08-21 DIAGNOSIS — I3139 Other pericardial effusion (noninflammatory): Secondary | ICD-10-CM

## 2022-08-21 DIAGNOSIS — Z9889 Other specified postprocedural states: Secondary | ICD-10-CM | POA: Diagnosis not present

## 2022-08-21 DIAGNOSIS — E871 Hypo-osmolality and hyponatremia: Secondary | ICD-10-CM | POA: Diagnosis not present

## 2022-08-21 NOTE — Patient Instructions (Signed)
Medication Instructions:  You can stop Colchicine in mid July Continue all other medications   Lab Work: None ordered   Testing/Procedures: None ordered   Follow-Up: At [facility], you and your health needs are our priority.  As part of our continuing mission to provide you with exceptional heart care, we have created designated Provider Care Teams.  These Care Teams include your primary Cardiologist (physician) and Advanced Practice Providers (APPs -  Physician Assistants and Nurse Practitioners) who all work together to provide you with the care you need, when you need it.  We recommend signing up for the patient portal called "MyChart".  Sign up information is provided on this After Visit Summary.  MyChart is used to connect with patients for Virtual Visits (Telemedicine).  Patients are able to view lab/test results, encounter notes, upcoming appointments, etc.  Non-urgent messages can be sent to your provider as well.   To learn more about what you can do with MyChart, go to [email or web address].    Your next appointment:  As Needed    Provider:  Dr.Jordan

## 2022-11-19 ENCOUNTER — Ambulatory Visit: Payer: Federal, State, Local not specified - PPO | Attending: Internal Medicine | Admitting: Internal Medicine

## 2022-11-19 ENCOUNTER — Encounter: Payer: Self-pay | Admitting: Internal Medicine

## 2022-11-19 VITALS — BP 112/79 | HR 73 | Resp 14 | Ht 65.5 in | Wt 181.0 lb

## 2022-11-19 DIAGNOSIS — M791 Myalgia, unspecified site: Secondary | ICD-10-CM | POA: Diagnosis not present

## 2022-11-19 DIAGNOSIS — R600 Localized edema: Secondary | ICD-10-CM | POA: Insufficient documentation

## 2022-11-19 DIAGNOSIS — R700 Elevated erythrocyte sedimentation rate: Secondary | ICD-10-CM | POA: Insufficient documentation

## 2022-11-19 DIAGNOSIS — I3139 Other pericardial effusion (noninflammatory): Secondary | ICD-10-CM

## 2022-11-19 NOTE — Progress Notes (Unsigned)
Office Visit Note  Patient: Christopher Lara             Date of Birth: [birth year]           MRN: [id number]             PCP: Georganna Skeans, MD Referring: Georganna Skeans, MD Visit Date: 11/19/2022 Occupation: Mail courier  Subjective:  New Patient (Initial Visit) (Patient states he sometimes has joint pain in his knees and ankles. Patient states he has swelling in his ankles. Patient states he is currently not on any medication. )   History of Present Illness: Christopher Lara is a 47 y.o. male here for evaluation of elevated sedimentation rate with pericarditis and rhabdomyolysis.  His medical history significant for multiple kidney stones he reports previous urology evaluation for this.  He had previous syncopal episodes in 2021 associated with COVID infection and significant hyponatremia down to 108 with altered mental status and in 2022 following acute gastroenteritis event with severe diarrhea. Current problem started with admission in February with severe weakness muscle aches altered mental status.  Symptoms were preceded by COVID infection for which she was prescribed Paxlovid.  He was found to be hypertensive found to have a large pericardial effusion.  Labs showed markedly low sodium of 105 and CK elevation to 15,000.  He was not in cardiac tamponade but did have a large pericardial effusion 3.85 cm.  He was treated with hypertonic saline and subsequently had pericardiocentesis with 1 L fluid removed that was transudative.  Clinic follow-up in March redemonstrated recurrence of the large pericardial effusion.  He was readmitted in April this time with symptomatic chest pain that he describes as sharp at the left side and back and positional.  He had pericardial window surgery with chest tube for 3 days and started on colchicine that he took for 3 months.  Was treated with Imodium for diarrhea with this.  Currently he is off any maintenance treatment and has no continued chest pain or muscle  pain.  He is fatigued but does not describe dyspnea on exertion.  He gets swelling in his ankles and some pain in his feet and ankles but he walks about 4 miles per day or more for his work.  Compression fracture at T10 vertebra associated with his fall from syncopal episode in 2021.    Activities of Daily Living:  Patient reports morning stiffness for 15 minutes.   Patient Denies nocturnal pain.  Difficulty dressing/grooming: Denies Difficulty climbing stairs: Denies Difficulty getting out of chair: Denies Difficulty using hands for taps, buttons, cutlery, and/or writing: Denies  Review of Systems  Constitutional:  Positive for fatigue.  HENT:  Negative for mouth sores and mouth dryness.   Eyes:  Negative for dryness.  Respiratory:  Negative for shortness of breath.   Cardiovascular:  Negative for chest pain and palpitations.  Gastrointestinal:  Negative for blood in stool, constipation and diarrhea.  Endocrine: Negative for increased urination.  Genitourinary:  Negative for involuntary urination.  Musculoskeletal:  Positive for joint pain, joint pain, joint swelling and morning stiffness. Negative for gait problem, myalgias, muscle weakness, muscle tenderness and myalgias.  Skin:  Negative for color change, rash, hair loss and sensitivity to sunlight.  Allergic/Immunologic: Negative for susceptible to infections.  Neurological:  Negative for dizziness and headaches.  Hematological:  Negative for swollen glands.  Psychiatric/Behavioral:  Negative for depressed mood and sleep disturbance. The patient is not nervous/anxious.     PMFS History:  Patient Active Problem List   Diagnosis Date Noted   Sedimentation rate elevation 11/19/2022   Myalgia 11/19/2022   Peripheral edema 11/19/2022   S/P pericardial window creation 06/11/2022   AKI (acute kidney injury) (HCC) 06/07/2022   Hypercalcemia 06/07/2022   Pericardial effusion 04/19/2022   Physical debility 03/20/2019   Pneumonia due  to COVID-19 virus 03/20/2019   Hyponatremia 03/15/2019    Past Medical History:  Diagnosis Date   Kidney stone    Kidney stone    Kidney stones    Kidney stones     Family History  Problem Relation Age of Onset   Heart failure Father    Heart attack Father    Cancer Brother    Past Surgical History:  Procedure Laterality Date   PERICARDIOCENTESIS N/A 04/19/2022   Procedure: PERICARDIOCENTESIS;  Surgeon: Kathleene Hazel, MD;  Location: [facility] INVASIVE CV LAB;  Service: Cardiovascular;  Laterality: N/A;   XI ROBOTIC ASSISTED PERICARDIAL WINDOW Right 06/11/2022   Procedure: XI ROBOTIC ASSISTED THORACOSCOPY PERICARDIAL WINDOW;  Surgeon: Corliss Skains, MD;  Location: [facility] OR;  Service: Thoracic;  Laterality: Right;   Social History   Social History Narrative   Not on file    There is no immunization history on file for this patient.   Objective: Vital Signs: BP 112/79 (BP Location: Right Arm, Patient Position: Sitting, Cuff Size: Normal)   Pulse 73   Resp 14   Ht 5' 5.5" (1.664 m)   Wt 181 lb (82.1 kg)   BMI 29.66 kg/m    Physical Exam HENT:     Mouth/Throat:     Mouth: Mucous membranes are moist.     Pharynx: Oropharynx is clear.  Eyes:     Conjunctiva/sclera: Conjunctivae normal.  Cardiovascular:     Rate and Rhythm: Normal rate and regular rhythm.  Pulmonary:     Effort: Pulmonary effort is normal.     Breath sounds: Normal breath sounds.  Musculoskeletal:     Comments: Trace pitting pedal edema bilaterally  Lymphadenopathy:     Cervical: No cervical adenopathy.  Skin:    General: Skin is warm and dry.     Findings: No rash.  Neurological:     Mental Status: He is alert.     Motor: No weakness.  Psychiatric:        Mood and Affect: Mood normal.      Musculoskeletal Exam:  Shoulders full ROM no tenderness or swelling Elbows full ROM no tenderness or swelling Wrists full ROM no tenderness or swelling Fingers full ROM no tenderness or  swelling Knees full ROM no tenderness or swelling Ankles full ROM no tenderness or swelling  Investigation: No additional findings.  Imaging: No results found.  Recent Labs: Lab Results  Component Value Date   WBC 8.7 06/15/2022   HGB 12.8 (L) 06/15/2022   PLT 238 06/15/2022   NA 134 06/20/2022   K 4.2 06/20/2022   CL 96 06/20/2022   CO2 19 (L) 06/20/2022   GLUCOSE 68 (L) 06/20/2022   BUN 14 06/20/2022   CREATININE 1.20 06/20/2022   BILITOT 0.4 06/13/2022   ALKPHOS 48 06/13/2022   AST 62 (H) 06/13/2022   ALT 25 06/13/2022   PROT 6.9 06/13/2022   ALBUMIN 3.6 06/13/2022   CALCIUM 10.7 (H) 06/20/2022   GFRAA 91 04/21/2019    Speciality Comments: No specialty comments available.  Procedures:  No procedures performed Allergies: Patient has no known allergies.   Assessment / Plan:  Visit Diagnoses: Sedimentation rate elevation - Plan: Sedimentation rate, COMPLETE METABOLIC PANEL WITH GFR, ANA  Pericardial effusion  Myalgia - Plan: CK, Thyroid Panel With TSH  Peripheral edema  Orders: Orders Placed This Encounter  Procedures   Sedimentation rate   CK   COMPLETE METABOLIC PANEL WITH GFR   ANA   Thyroid Panel With TSH   No orders of the defined types were placed in this encounter.   Face-to-face time spent with patient was *** minutes. Greater than 50% of time was spent in counseling and coordination of care.  Follow-Up Instructions: No follow-ups on file.   Fuller Plan, MD  Note - This record has been created using [employer].  Chart creation errors have been sought, but may not always  have been located. Such creation errors do not reflect on  the standard of medical care.

## 2022-11-20 LAB — THYROID PANEL WITH TSH
T3 Uptake: 19 % — ABNORMAL LOW (ref 22–35)
T4, Total: <0.7 ug/dL — ABNORMAL LOW (ref 4.9–10.5)
TSH: 1.46 mIU/L (ref 0.40–4.50)

## 2022-11-20 LAB — COMPLETE METABOLIC PANEL WITH GFR
AG Ratio: 1.5 (calc) (ref 1.0–2.5)
ALT: 20 U/L (ref 9–46)
AST: 67 U/L — ABNORMAL HIGH (ref 10–40)
Albumin: 4.6 g/dL (ref 3.6–5.1)
Alkaline phosphatase (APISO): 42 U/L (ref 36–130)
BUN/Creatinine Ratio: 11 (calc) (ref 6–22)
BUN: 16 mg/dL (ref 7–25)
CO2: 25 mmol/L (ref 20–32)
Calcium: 11.5 mg/dL — ABNORMAL HIGH (ref 8.6–10.3)
Chloride: 106 mmol/L (ref 98–110)
Creat: 1.5 mg/dL — ABNORMAL HIGH (ref 0.60–1.29)
Globulin: 3 g/dL (calc) (ref 1.9–3.7)
Glucose, Bld: 59 mg/dL — ABNORMAL LOW (ref 65–99)
Potassium: 4.3 mmol/L (ref 3.5–5.3)
Sodium: 140 mmol/L (ref 135–146)
Total Bilirubin: 0.4 mg/dL (ref 0.2–1.2)
Total Protein: 7.6 g/dL (ref 6.1–8.1)
eGFR: 57 mL/min/{1.73_m2} — ABNORMAL LOW (ref >=60)

## 2022-11-20 LAB — ANA: Anti Nuclear Antibody (ANA): NEGATIVE

## 2022-11-20 LAB — SEDIMENTATION RATE: Sed Rate: 14 mm/h (ref 0–15)

## 2022-11-20 LAB — CK: Total CK: 1592 U/L — ABNORMAL HIGH (ref 44–196)

## 2022-12-06 ENCOUNTER — Encounter: Payer: Self-pay | Admitting: Internal Medicine

## 2022-12-06 DIAGNOSIS — E039 Hypothyroidism, unspecified: Secondary | ICD-10-CM

## 2022-12-06 NOTE — Progress Notes (Signed)
CK is elevated at 1,592.  His calcium is high at 11.5.  AST is elevated at 67 but this may be incidental due to the high CK.   T4 is undetectable with a normal TSH this indicates severe hypothyroidism.  Severely low thyroid hormone can cause the muscle dysfunction and fluid around the heart he experienced earlier this year.

## 2022-12-06 NOTE — Telephone Encounter (Signed)
Yes, I recommended that he needed a referral either from his PCP or we could send this.  I spoke with Christopher Lara by phone about his lab results back when they resulted. He has severe hypothyroidism with an undetectable T4 that is probably the cause of his high CK and his large pericardial effusion. The referral should be urgent or at least not months out.

## 2022-12-11 DIAGNOSIS — E038 Other specified hypothyroidism: Secondary | ICD-10-CM | POA: Diagnosis not present

## 2022-12-11 DIAGNOSIS — R748 Abnormal levels of other serum enzymes: Secondary | ICD-10-CM | POA: Diagnosis not present

## 2022-12-11 DIAGNOSIS — E230 Hypopituitarism: Secondary | ICD-10-CM | POA: Diagnosis not present

## 2022-12-14 ENCOUNTER — Other Ambulatory Visit: Payer: Self-pay | Admitting: Internal Medicine

## 2022-12-14 DIAGNOSIS — E230 Hypopituitarism: Secondary | ICD-10-CM

## 2022-12-18 ENCOUNTER — Ambulatory Visit
Admission: RE | Admit: 2022-12-18 | Discharge: 2022-12-18 | Disposition: A | Discharge status: Home / Self Care | Payer: Federal, State, Local not specified - PPO | Source: Ambulatory Visit | Attending: Internal Medicine | Admitting: Internal Medicine

## 2022-12-18 DIAGNOSIS — E236 Other disorders of pituitary gland: Secondary | ICD-10-CM | POA: Diagnosis not present

## 2022-12-18 DIAGNOSIS — E230 Hypopituitarism: Secondary | ICD-10-CM

## 2022-12-18 MED ORDER — GADOPICLENOL 0.5 MMOL/ML IV SOLN
10.0000 mL | Freq: Once | INTRAVENOUS | Status: AC | PRN
  Administered 2022-12-18: 10 mL via INTRAVENOUS

## 2022-12-28 DIAGNOSIS — E871 Hypo-osmolality and hyponatremia: Secondary | ICD-10-CM | POA: Diagnosis not present

## 2022-12-28 DIAGNOSIS — E038 Other specified hypothyroidism: Secondary | ICD-10-CM | POA: Diagnosis not present

## 2022-12-28 DIAGNOSIS — E230 Hypopituitarism: Secondary | ICD-10-CM | POA: Diagnosis not present

## 2023-03-08 DIAGNOSIS — M109 Gout, unspecified: Secondary | ICD-10-CM | POA: Diagnosis not present

## 2023-03-08 DIAGNOSIS — E559 Vitamin D deficiency, unspecified: Secondary | ICD-10-CM | POA: Diagnosis not present

## 2023-03-08 DIAGNOSIS — R748 Abnormal levels of other serum enzymes: Secondary | ICD-10-CM | POA: Diagnosis not present

## 2023-03-08 DIAGNOSIS — E038 Other specified hypothyroidism: Secondary | ICD-10-CM | POA: Diagnosis not present

## 2023-03-08 DIAGNOSIS — E230 Hypopituitarism: Secondary | ICD-10-CM | POA: Diagnosis not present

## 2023-03-08 DIAGNOSIS — E871 Hypo-osmolality and hyponatremia: Secondary | ICD-10-CM | POA: Diagnosis not present

## 2023-03-08 DIAGNOSIS — E213 Hyperparathyroidism, unspecified: Secondary | ICD-10-CM | POA: Diagnosis not present

## 2023-06-04 ENCOUNTER — Encounter (HOSPITAL_COMMUNITY): Payer: Self-pay | Admitting: *Deleted

## 2023-06-04 ENCOUNTER — Encounter (HOSPITAL_COMMUNITY)
Admission: EM | Disposition: A | Discharge status: Home / Self Care | Payer: Self-pay | Source: Home / Self Care | Attending: Cardiology

## 2023-06-04 ENCOUNTER — Emergency Department (HOSPITAL_COMMUNITY)

## 2023-06-04 ENCOUNTER — Other Ambulatory Visit: Payer: Self-pay

## 2023-06-04 ENCOUNTER — Inpatient Hospital Stay (HOSPITAL_COMMUNITY)
Admission: EM | Admit: 2023-06-04 | Discharge: 2023-06-06 | DRG: 322 | Disposition: A | Discharge status: Home / Self Care | Attending: Cardiovascular Disease | Admitting: Cardiovascular Disease

## 2023-06-04 DIAGNOSIS — I214 Non-ST elevation (NSTEMI) myocardial infarction: Principal | ICD-10-CM | POA: Diagnosis present

## 2023-06-04 DIAGNOSIS — E785 Hyperlipidemia, unspecified: Secondary | ICD-10-CM | POA: Diagnosis present

## 2023-06-04 DIAGNOSIS — E230 Hypopituitarism: Secondary | ICD-10-CM | POA: Diagnosis present

## 2023-06-04 DIAGNOSIS — R000 Tachycardia, unspecified: Secondary | ICD-10-CM | POA: Diagnosis present

## 2023-06-04 DIAGNOSIS — E213 Hyperparathyroidism, unspecified: Secondary | ICD-10-CM | POA: Diagnosis not present

## 2023-06-04 DIAGNOSIS — Z8249 Family history of ischemic heart disease and other diseases of the circulatory system: Secondary | ICD-10-CM

## 2023-06-04 DIAGNOSIS — E039 Hypothyroidism, unspecified: Secondary | ICD-10-CM | POA: Diagnosis present

## 2023-06-04 DIAGNOSIS — Z955 Presence of coronary angioplasty implant and graft: Secondary | ICD-10-CM | POA: Diagnosis not present

## 2023-06-04 DIAGNOSIS — Z8616 Personal history of COVID-19: Secondary | ICD-10-CM | POA: Diagnosis not present

## 2023-06-04 DIAGNOSIS — I2510 Atherosclerotic heart disease of native coronary artery without angina pectoris: Secondary | ICD-10-CM | POA: Diagnosis present

## 2023-06-04 DIAGNOSIS — R918 Other nonspecific abnormal finding of lung field: Secondary | ICD-10-CM | POA: Diagnosis not present

## 2023-06-04 DIAGNOSIS — I25110 Atherosclerotic heart disease of native coronary artery with unstable angina pectoris: Secondary | ICD-10-CM

## 2023-06-04 DIAGNOSIS — Z7902 Long term (current) use of antithrombotics/antiplatelets: Secondary | ICD-10-CM

## 2023-06-04 DIAGNOSIS — Z7952 Long term (current) use of systemic steroids: Secondary | ICD-10-CM

## 2023-06-04 DIAGNOSIS — R7989 Other specified abnormal findings of blood chemistry: Secondary | ICD-10-CM

## 2023-06-04 DIAGNOSIS — Z79890 Hormone replacement therapy (postmenopausal): Secondary | ICD-10-CM | POA: Diagnosis not present

## 2023-06-04 DIAGNOSIS — Z7982 Long term (current) use of aspirin: Secondary | ICD-10-CM

## 2023-06-04 DIAGNOSIS — Z79899 Other long term (current) drug therapy: Secondary | ICD-10-CM

## 2023-06-04 DIAGNOSIS — M79602 Pain in left arm: Secondary | ICD-10-CM | POA: Diagnosis not present

## 2023-06-04 HISTORY — DX: Hyperparathyroidism, unspecified: E21.3

## 2023-06-04 HISTORY — DX: Hypo-osmolality and hyponatremia: E87.1

## 2023-06-04 HISTORY — DX: Pneumonia due to coronavirus disease 2019: J12.82

## 2023-06-04 HISTORY — PX: LEFT HEART CATH AND CORONARY ANGIOGRAPHY: CATH118249

## 2023-06-04 HISTORY — DX: Hypopituitarism: E23.0

## 2023-06-04 HISTORY — DX: Hypercalcemia: E83.52

## 2023-06-04 HISTORY — DX: COVID-19: U07.1

## 2023-06-04 HISTORY — DX: Other pericardial effusion (noninflammatory): I31.39

## 2023-06-04 HISTORY — PX: CORONARY PRESSURE/FFR STUDY: CATH118243

## 2023-06-04 HISTORY — DX: Hypothyroidism, unspecified: E03.9

## 2023-06-04 HISTORY — PX: CORONARY STENT INTERVENTION: CATH118234

## 2023-06-04 LAB — BASIC METABOLIC PANEL WITH GFR
Anion gap: 6 (ref 5–15)
BUN: 10 mg/dL (ref 6–20)
CO2: 25 mmol/L (ref 22–32)
Calcium: 11.6 mg/dL — ABNORMAL HIGH (ref 8.9–10.3)
Chloride: 101 mmol/L (ref 98–111)
Creatinine, Ser: 0.96 mg/dL (ref 0.61–1.24)
GFR, Estimated: >60 mL/min (ref >60)
Glucose, Bld: 119 mg/dL — ABNORMAL HIGH (ref 70–99)
Potassium: 3.7 mmol/L (ref 3.5–5.1)
Sodium: 132 mmol/L — ABNORMAL LOW (ref 135–145)

## 2023-06-04 LAB — ECHOCARDIOGRAM COMPLETE
AR max vel: 3.71 cm2
AV Area VTI: 3.77 cm2
AV Area mean vel: 3.56 cm2
AV Mean grad: 4 mmHg
AV Peak grad: 7.7 mmHg
Ao pk vel: 1.39 m/s
Area-P 1/2: 3.85 cm2
S' Lateral: 2.3 cm

## 2023-06-04 LAB — TROPONIN I (HIGH SENSITIVITY)
Troponin I (High Sensitivity): 13045 ng/L (ref <18)
Troponin I (High Sensitivity): 11941 ng/L (ref <18)

## 2023-06-04 LAB — CBC
HCT: 40.7 % (ref 39.0–52.0)
Hemoglobin: 13.4 g/dL (ref 13.0–17.0)
MCH: 30.5 pg (ref 26.0–34.0)
MCHC: 32.9 g/dL (ref 30.0–36.0)
MCV: 92.5 fL (ref 80.0–100.0)
Platelets: 263 10*3/uL (ref 150–400)
RBC: 4.4 MIL/uL (ref 4.22–5.81)
RDW: 11.8 % (ref 11.5–15.5)
WBC: 9.1 10*3/uL (ref 4.0–10.5)
nRBC: 0 % (ref 0.0–0.2)

## 2023-06-04 LAB — TSH: TSH: 0.013 u[IU]/mL — ABNORMAL LOW (ref 0.350–4.500)

## 2023-06-04 LAB — POCT ACTIVATED CLOTTING TIME: Activated Clotting Time: 314 s

## 2023-06-04 LAB — CK: Total CK: 530 U/L — ABNORMAL HIGH (ref 49–397)

## 2023-06-04 LAB — T4, FREE: Free T4: 0.88 ng/dL (ref 0.61–1.12)

## 2023-06-04 SURGERY — LEFT HEART CATH AND CORONARY ANGIOGRAPHY
Anesthesia: LOCAL

## 2023-06-04 MED ORDER — HEPARIN SODIUM (PORCINE) 1000 UNIT/ML IJ SOLN
INTRAMUSCULAR | Status: DC | PRN
Start: 2023-06-04 — End: 2023-06-04
  Administered 2023-06-04 (×2): 4000 [IU] via INTRAVENOUS

## 2023-06-04 MED ORDER — HYDROCORTISONE 10 MG PO TABS
10.0000 mg | ORAL_TABLET | Freq: Every morning | ORAL | Status: DC
  Administered 2023-06-05 – 2023-06-06 (×2): 10 mg via ORAL
  Filled 2023-06-04 (×2): qty 1

## 2023-06-04 MED ORDER — PRASUGREL HCL 10 MG PO TABS
ORAL_TABLET | ORAL | Status: DC | PRN
  Administered 2023-06-04: 60 mg via ORAL

## 2023-06-04 MED ORDER — ASPIRIN 81 MG PO CHEW
324.0000 mg | CHEWABLE_TABLET | Freq: Once | ORAL | Status: AC
  Administered 2023-06-04: 324 mg via ORAL
  Filled 2023-06-04: qty 4

## 2023-06-04 MED ORDER — ASPIRIN 81 MG PO TBEC
81.0000 mg | DELAYED_RELEASE_TABLET | Freq: Every day | ORAL | Status: DC
  Administered 2023-06-05 – 2023-06-06 (×2): 81 mg via ORAL
  Filled 2023-06-04 (×3): qty 1

## 2023-06-04 MED ORDER — VERAPAMIL HCL 2.5 MG/ML IV SOLN
INTRAVENOUS | Status: DC | PRN
  Administered 2023-06-04: 10 mL via INTRA_ARTERIAL

## 2023-06-04 MED ORDER — LEVOTHYROXINE SODIUM 100 MCG PO TABS
100.0000 ug | ORAL_TABLET | Freq: Every day | ORAL | Status: DC
  Administered 2023-06-05 – 2023-06-06 (×2): 100 ug via ORAL
  Filled 2023-06-04 (×2): qty 1

## 2023-06-04 MED ORDER — NITROGLYCERIN 1 MG/10 ML FOR IR/CATH LAB
INTRA_ARTERIAL | Status: AC
  Filled 2023-06-04: qty 10

## 2023-06-04 MED ORDER — HEPARIN SODIUM (PORCINE) 1000 UNIT/ML IJ SOLN
INTRAMUSCULAR | Status: AC
  Filled 2023-06-04: qty 10

## 2023-06-04 MED ORDER — SODIUM CHLORIDE 0.9 % IV SOLN: 250.0000 mL | INTRAVENOUS | Status: AC | PRN

## 2023-06-04 MED ORDER — CHOLECALCIFEROL 10 MCG (400 UNIT) PO TABS: 50000.0000 [IU] | ORAL_TABLET | ORAL | Status: DC

## 2023-06-04 MED ORDER — PRASUGREL HCL 10 MG PO TABS
10.0000 mg | ORAL_TABLET | Freq: Every day | ORAL | Status: DC
  Administered 2023-06-05 – 2023-06-06 (×2): 10 mg via ORAL
  Filled 2023-06-04 (×2): qty 1

## 2023-06-04 MED ORDER — NITROGLYCERIN 0.4 MG SL SUBL: 0.4000 mg | SUBLINGUAL_TABLET | SUBLINGUAL | Status: DC | PRN

## 2023-06-04 MED ORDER — LIDOCAINE HCL (PF) 1 % IJ SOLN
INTRAMUSCULAR | Status: AC
  Filled 2023-06-04: qty 30

## 2023-06-04 MED ORDER — MIDAZOLAM HCL 2 MG/2ML IJ SOLN
INTRAMUSCULAR | Status: DC | PRN
  Administered 2023-06-04: 1 mg via INTRAVENOUS

## 2023-06-04 MED ORDER — NITROGLYCERIN 1 MG/10 ML FOR IR/CATH LAB
INTRA_ARTERIAL | Status: DC | PRN
  Administered 2023-06-04: 100 ug via INTRACORONARY
  Administered 2023-06-04: 200 ug via INTRACORONARY

## 2023-06-04 MED ORDER — HEPARIN (PORCINE) IN NACL 1000-0.9 UT/500ML-% IV SOLN
INTRAVENOUS | Status: DC | PRN
  Administered 2023-06-04 (×3): 500 mL

## 2023-06-04 MED ORDER — VERAPAMIL HCL 2.5 MG/ML IV SOLN
INTRAVENOUS | Status: AC
  Filled 2023-06-04: qty 2

## 2023-06-04 MED ORDER — FENTANYL CITRATE (PF) 100 MCG/2ML IJ SOLN
INTRAMUSCULAR | Status: AC
Start: 2023-06-04 — End: ?
  Filled 2023-06-04: qty 2

## 2023-06-04 MED ORDER — ONDANSETRON HCL 4 MG/2ML IJ SOLN: 4.0000 mg | Freq: Four times a day (QID) | INTRAMUSCULAR | Status: DC | PRN

## 2023-06-04 MED ORDER — MIDAZOLAM HCL 2 MG/2ML IJ SOLN
INTRAMUSCULAR | Status: AC
  Filled 2023-06-04: qty 2

## 2023-06-04 MED ORDER — SODIUM CHLORIDE 0.9% FLUSH
3.0000 mL | Freq: Two times a day (BID) | INTRAVENOUS | Status: DC
  Administered 2023-06-04 – 2023-06-06 (×4): 3 mL via INTRAVENOUS

## 2023-06-04 MED ORDER — PERFLUTREN LIPID MICROSPHERE
1.0000 mL | INTRAVENOUS | Status: AC | PRN
  Administered 2023-06-04: 2 mL via INTRAVENOUS

## 2023-06-04 MED ORDER — ENOXAPARIN SODIUM 40 MG/0.4ML IJ SOSY
40.0000 mg | PREFILLED_SYRINGE | INTRAMUSCULAR | Status: DC
  Administered 2023-06-05 – 2023-06-06 (×2): 40 mg via SUBCUTANEOUS
  Filled 2023-06-04 (×2): qty 0.4

## 2023-06-04 MED ORDER — SODIUM CHLORIDE 0.9% FLUSH: 3.0000 mL | INTRAVENOUS | Status: DC | PRN

## 2023-06-04 MED ORDER — ACETAMINOPHEN 325 MG PO TABS: 650.0000 mg | ORAL_TABLET | ORAL | Status: DC | PRN

## 2023-06-04 MED ORDER — SODIUM CHLORIDE 0.9 % IV SOLN: INTRAVENOUS | Status: DC

## 2023-06-04 MED ORDER — FENTANYL CITRATE (PF) 100 MCG/2ML IJ SOLN
INTRAMUSCULAR | Status: DC | PRN
  Administered 2023-06-04: 25 ug via INTRAVENOUS

## 2023-06-04 MED ORDER — SODIUM CHLORIDE 0.9 % IV SOLN: INTRAVENOUS | Status: AC

## 2023-06-04 MED ORDER — IOHEXOL 350 MG/ML SOLN
INTRAVENOUS | Status: DC | PRN
  Administered 2023-06-04: 150 mL

## 2023-06-04 MED ORDER — LIDOCAINE HCL (PF) 1 % IJ SOLN
INTRAMUSCULAR | Status: DC | PRN
  Administered 2023-06-04: 2 mL

## 2023-06-04 MED ORDER — HYDROCORTISONE 5 MG PO TABS
5.0000 mg | ORAL_TABLET | Freq: Every evening | ORAL | Status: DC
  Administered 2023-06-04 – 2023-06-05 (×2): 5 mg via ORAL
  Filled 2023-06-04 (×2): qty 1

## 2023-06-04 SURGICAL SUPPLY — 23 items
BALLN EMERGE MR 2.5X15 (BALLOONS) ×1 IMPLANT
BALLN ~~LOC~~ EMERGE MR 3.0X20 (BALLOONS) ×1 IMPLANT
BALLOON EMERGE MR 2.5X15 (BALLOONS) IMPLANT
BALLOON ~~LOC~~ EMERGE MR 3.0X20 (BALLOONS) IMPLANT
CATH 5FR JL3.5 JR4 ANG PIG MP (CATHETERS) IMPLANT
CATH LAUNCHER 6FR JR4 (CATHETERS) IMPLANT
CATH VISTA GUIDE 6FR XBLD 3.5 (CATHETERS) IMPLANT
DEVICE RAD COMP TR BAND LRG (VASCULAR PRODUCTS) IMPLANT
GLIDESHEATH SLEND SS 6F .021 (SHEATH) IMPLANT
GUIDEWIRE INQWIRE 1.5J.035X260 (WIRE) IMPLANT
GUIDEWIRE PRESSURE X 175 (WIRE) IMPLANT
INQWIRE 1.5J .035X260CM (WIRE) ×1 IMPLANT
KIT ENCORE 26 ADVANTAGE (KITS) IMPLANT
PACK CARDIAC CATHETERIZATION (CUSTOM PROCEDURE TRAY) ×1 IMPLANT
SET ATX-X65L (MISCELLANEOUS) IMPLANT
SHEATH PROBE COVER 6X72 (BAG) IMPLANT
STENT SYNERGY XD 2.50X38 (Permanent Stent) IMPLANT
STENT SYNERGY XD 3.0X24 (Permanent Stent) IMPLANT
SYNERGY XD 2.50X38 (Permanent Stent) ×1 IMPLANT
SYNERGY XD 3.0X24 (Permanent Stent) ×1 IMPLANT
TUBING CIL FLEX 10 FLL-RA (TUBING) IMPLANT
WIRE ASAHI PROWATER 180CM (WIRE) IMPLANT
WIRE PT2 MS 185 (WIRE) IMPLANT

## 2023-06-04 NOTE — ED Notes (Signed)
 ECHO at bedside.

## 2023-06-04 NOTE — Plan of Care (Signed)
  Problem: Education: Goal: Understanding of CV disease, CV risk reduction, and recovery process will improve Outcome: Progressing Goal: Individualized Educational Video(s) Outcome: Progressing   Problem: Activity: Goal: Ability to return to baseline activity level will improve Outcome: Progressing   Problem: Cardiovascular: Goal: Ability to achieve and maintain adequate cardiovascular perfusion will improve Outcome: Progressing Goal: Vascular access site(s) Level 0-1 will be maintained Outcome: Progressing   Problem: Health Behavior/Discharge Planning: Goal: Ability to safely manage health-related needs after discharge will improve Outcome: Progressing   Problem: Education: Goal: Knowledge of General Education information will improve Description: Including pain rating scale, medication(s)/side effects and non-pharmacologic comfort measures Outcome: Progressing   Problem: Health Behavior/Discharge Planning: Goal: Ability to manage health-related needs will improve Outcome: Progressing   Problem: Clinical Measurements: Goal: Ability to maintain clinical measurements within normal limits will improve Outcome: Progressing Goal: Will remain free from infection Outcome: Progressing Goal: Diagnostic test results will improve Outcome: Progressing Goal: Respiratory complications will improve Outcome: Progressing Goal: Cardiovascular complication will be avoided Outcome: Progressing   Problem: Activity: Goal: Risk for activity intolerance will decrease Outcome: Progressing   Problem: Nutrition: Goal: Adequate nutrition will be maintained Outcome: Progressing   Problem: Coping: Goal: Level of anxiety will decrease Outcome: Progressing   Problem: Elimination: Goal: Will not experience complications related to bowel motility Outcome: Progressing Goal: Will not experience complications related to urinary retention Outcome: Progressing   Problem: Pain Managment: Goal:  General experience of comfort will improve and/or be controlled Outcome: Progressing   Problem: Safety: Goal: Ability to remain free from injury will improve Outcome: Progressing   Problem: Skin Integrity: Goal: Risk for impaired skin integrity will decrease Outcome: Progressing   Problem: Education: Goal: Understanding of cardiac disease, CV risk reduction, and recovery process will improve Outcome: Progressing Goal: Individualized Educational Video(s) Outcome: Progressing   Problem: Activity: Goal: Ability to tolerate increased activity will improve Outcome: Progressing   Problem: Cardiac: Goal: Ability to achieve and maintain adequate cardiovascular perfusion will improve Outcome: Progressing   Problem: Health Behavior/Discharge Planning: Goal: Ability to safely manage health-related needs after discharge will improve Outcome: Progressing

## 2023-06-04 NOTE — ED Provider Notes (Signed)
 [place] EMERGENCY DEPARTMENT AT [facility] Provider Note   CSN: [id number] Arrival date & time: 06/04/23  1216     History  Chief Complaint  Patient presents with   Arm Pain    Christopher Lara is a 48 y.o. male.  HPI Patient presents with concern of left arm pain and fatigue.  Patient's history is notable for endocrinologic dysfunction, chronic steroid use and most notably for pericardial effusion last year requiring pericardial window.  He has been doing generally well with past days developed left arm pain, fatigue.  With worsening arm pain, no chest pain, no back pain, fever, nausea, vomiting or other concerns he presents for evaluation with his wife.    Home Medications Prior to Admission medications   Medication Sig Start Date End Date Taking? Authorizing Provider  colchicine 0.6 MG tablet Take 1 tablet (0.6 mg total) by mouth 2 (two) times daily. Patient taking differently: Take 0.6 mg by mouth daily. 06/15/22 09/13/22  Lorin Glass, MD  omeprazole (PRILOSEC) 20 MG capsule Take 1 capsule (20 mg total) by mouth daily. 30 minutes prior to meal. Patient not taking: Reported on 10/01/2014 09/21/14 11/06/14  Ladona Mow, PA-C      Allergies    Patient has no known allergies.    Review of Systems   Review of Systems  Physical Exam Updated Vital Signs BP 112/83 (BP Location: Right Arm)   Pulse 100   Temp 98.2 F (36.8 C)   Resp 16   SpO2 98%  Physical Exam Vitals and nursing note reviewed.  Constitutional:      General: He is not in acute distress.    Appearance: He is well-developed.  HENT:     Head: Normocephalic and atraumatic.  Eyes:     Conjunctiva/sclera: Conjunctivae normal.  Cardiovascular:     Rate and Rhythm: Regular rhythm. Tachycardia present.     Pulses: Normal pulses.  Pulmonary:     Effort: Pulmonary effort is normal. No respiratory distress.     Breath sounds: No stridor.  Abdominal:     General: There is no distension.  Skin:     General: Skin is warm and dry.  Neurological:     Mental Status: He is alert and oriented to person, place, and time.     ED Results / Procedures / Treatments   Labs (all labs ordered are listed, but only abnormal results are displayed) Labs Reviewed  BASIC METABOLIC PANEL WITH GFR - Abnormal; Notable for the following components:      Result Value   Sodium 132 (*)    Glucose, Bld 119 (*)    Calcium 11.6 (*)    All other components within normal limits  TROPONIN I (HIGH SENSITIVITY) - Abnormal; Notable for the following components:   Troponin I (High Sensitivity) 13,045 (*)    All other components within normal limits  CBC  T4, FREE  TSH  CK  TROPONIN I (HIGH SENSITIVITY)    EKG None  Radiology No results found.  Procedures Procedures    Medications Ordered in ED Medications  aspirin chewable tablet 324 mg (has no administration in time range)    ED Course/ Medical Decision Making/ A&P                                 Medical Decision Making Patient with a history of pericardial effusion, endocrinologic dysfunction, and notable family history of early  cardiac death presents with left arm pain.  Absence of chest pain somewhat reassuring, but given his substantial risk and history, differential including recurrent effusion, ACS, infection, thyroid dysfunction considered. Cardiac 100 borderline Pulse ox 98% room air normal   Amount and/or Complexity of Data Reviewed Independent Historian: spouse External Data Reviewed: notes.    Details: Cardiology review from last year Labs: ordered. Decision-making details documented in ED Course. Radiology: ordered and independent interpretation performed. Decision-making details documented in ED Course. ECG/medicine tests: ordered and independent interpretation performed. Decision-making details documented in ED Course.  Risk OTC drugs. Decision regarding hospitalization.  2:10 PM Troponin 13,000.  I discussed the  patient's case with her cardiology team at bedside.  Given patient's history, concern for NSTEMI, patient will have a stat echo, which are ordered.  Update: Echo with wall motion abnormality, and I again discussed the patient's case with her cardiology team.  Patient is going to the Cath Lab.  Final Clinical Impression(s) / ED Diagnoses Final diagnoses:  NSTEMI (non-ST elevated myocardial infarction) (HCC)   CRITICAL CARE Performed by: Gerhard Munch Total critical care time: 35 minutes Critical care time was exclusive of separately billable procedures and treating other patients. Critical care was necessary to treat or prevent imminent or life-threatening deterioration. Critical care was time spent personally by me on the following activities: development of treatment plan with patient and/or surrogate as well as nursing, discussions with consultants, evaluation of patient's response to treatment, examination of patient, obtaining history from patient or surrogate, ordering and performing treatments and interventions, ordering and review of laboratory studies, ordering and review of radiographic studies, pulse oximetry and re-evaluation of patient's condition.    Gerhard Munch, MD 06/04/23 [phone]

## 2023-06-04 NOTE — Progress Notes (Signed)
 CK elevated to 530 but improved from 10/2022. Will need to take this into consideration if CAD present, may not be statin candidate.

## 2023-06-04 NOTE — H&P (Signed)
 Cardiology Admission History and Physical   Patient ID: Christopher Lara MRN: [id number]; DOB: [birth year]   Admission date: 06/04/2023  PCP:  Georganna Skeans, MD   [facility] Providers Cardiologist:  Peter Swaziland, MD        Chief Complaint:  chest pain  Patient Profile:   Christopher Lara is a 48 y.o. male with previous severe hyponatremia, Covid pneumonia in 02/2019, large pericardial effusion in 03/2022 requiring pericardiocentesis then later pericardial window 05/2022, kidney stone, panhypopituitarism, hypothyroidism, hyperparathyroidism, hypercalcemia who is being seen 06/04/2023 for the evaluation of marked troponin elevation.  History of Present Illness:   Christopher Lara was previously admitted in 02/2019 with severe hyponatremia to 108 in setting of Covid PNA and poor oral intake. He was seen at [facility] ED in December 2022 for syncopal episode that occurred after he took a hot shower.  He developed tunnel vision and a sensation that things were going dark before passed out and hit his face in the fall. He was prescribed phosphate supplement and recommend outpatient follow-up with PCP. Our team met him during admission 03/2022 when he again presented with confusion and hyponatremia of 105 felt due to combination of Covid infection (recent rx with Paxlovid in weeks before), non-traumatic rhabdomyolysis of unclear etiology, and large pericardial effusion requiring pericardiocentesis. F/u outpatient limited echo 05/23/22 showed recurrent large pericardial effusion. He was referred to CT surgery but before being seen, he was admitted to the hospital 05/2022 with chest pain. He ultimately underwent pericardial window by Dr. Cliffton Asters. He was treated with colchicine x3 months, had diarrhea with the BID dosing. He was doing great when seen back in follow-up 07/2022. Pertinent additional labs include pericardial fluid cytology with chronic inflammation (03/2022), pericardial biopsy benign with mild  fibrosis/reactive changes (05/2022), negative ANA (10/2022), persistently elevated CK level of 1592 (10/2022). 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 He was treated with hydrocortisone. 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 His [facility] med list included vitamin D, levothyroxine, and hydrocortisone. He reports compliance with these.  He has been in [place] lately, feeling good, no fever, chills, night sweats, chest pain or rib pain. On Sunday afternoon he developed persistent left arm pain that continued, occasionally waxing and waning without specific provoking/alleviating factors, though today. He did try Tylenol He did not have any chest pain, SOB, nausea, vomiting with this. Because of his cardiac history and the persistence of discomfort, he came to the ED where initial troponin was 13,045. Labs show Na 132, glucose 119, calcium 11.6, troponin 13,045->11,941, CBC wnl, CK 503. EKG shows NSR 81bpm, with inferior Q waves and ST-TW changes inferiorly, with ST depression/TWI changes I, avL, V4-V6. Cardmaster reviewed with Dr. Swaziland who recommended check stat echo to assess for WMA to help define ACS vs pericarditis. He received 324mg  ASA. CXR showed scattered patchy peripheral upper lung predominant opacities, which may represent pulmonary nodules. Recommend further evaluation with nonemergent CT chest, as well as cortical irregularity of the right posterior seventh rib, which may represent a nondisplaced fracture. He denies any recent trauma or illness. He can still feel 4/10 left arm pain.   Past Medical History:  Diagnosis Date   Hypercalcemia    Hyperparathyroidism (HCC)    Hyponatremia    Hypothyroidism    Kidney stone    Panhypopituitarism (HCC)    Pericardial effusion    Pneumonia due to COVID-19 virus  Past Surgical History:  Procedure Laterality Date   PERICARDIOCENTESIS N/A  04/19/2022   Procedure: PERICARDIOCENTESIS;  Surgeon: Kathleene Hazel, MD;  Location: [facility] INVASIVE CV LAB;  Service: Cardiovascular;  Laterality: N/A;   XI ROBOTIC ASSISTED PERICARDIAL WINDOW Right 06/11/2022   Procedure: XI ROBOTIC ASSISTED THORACOSCOPY PERICARDIAL WINDOW;  Surgeon: Corliss Skains, MD;  Location: [facility] OR;  Service: Thoracic;  Laterality: Right;     Medications Prior to Admission:  Ergocalciferol 1.25 MG (50000 UT) 1 capsule right after a meal by mouth once a week for 90 days    Levothyroxine Sodium 100 MCG 1 tablet in the morning on an empty stomach by mouth daily    Hydrocortisone 5 MG Take 2 tablets in the morning and 1 between 4 pm and 6 pm by mouth twice daily     Allergies:   No Known Allergies  Social History:   Social History   Socioeconomic History   Marital status: Married    Spouse name: Not on file   Number of children: Not on file   Years of education: Not on file   Highest education level: Not on file  Occupational History   Not on file  Tobacco Use   Smoking status: Never    Passive exposure: Never   Smokeless tobacco: Never  Vaping Use   Vaping status: Never Used  Substance and Sexual Activity   Alcohol use: No   Drug use: No   Sexual activity: Not on file  Other Topics Concern   Not on file  Social History Narrative   Not on file   Social Drivers of Health   Financial Resource Strain: Not on file  Food Insecurity: No Food Insecurity (06/18/2022)   Hunger Vital Sign    Worried About Running Out of Food in the Last Year: Never true    Ran Out of Food in the Last Year: Never true  Transportation Needs: No Transportation Needs (06/18/2022)   PRAPARE - Administrator, Civil Service (Medical): No    Lack of Transportation (Non-Medical): No  Physical Activity: Not on file  Stress: Not on file  Social Connections: Unknown (04/12/2022)   [facility] from [facility]   Social Network    Social Network: Not  on file  Intimate Partner Violence: Not At Risk (06/07/2022)   Humiliation, Afraid, Rape, and Kick questionnaire    Fear of Current or Ex-Partner: No    Emotionally Abused: No    Physically Abused: No    Sexually Abused: No    Family History:   The patient's family history includes Cancer in his brother; Heart attack in his father; Heart failure in his father.    ROS:  Please see the history of present illness. All other ROS reviewed and negative.     Physical Exam/Data:   Vitals:   06/04/23 1228  BP: 112/83  Pulse: 100  Resp: 16  Temp: 98.2 F (36.8 C)  SpO2: 98%   No intake or output data in the 24 hours ending 06/04/23 1502    11/19/2022    8:28 AM 08/21/2022    8:20 AM 07/20/2022   12:35 PM  Last 3 Weights  Weight (lbs) 181 lb 177 lb 9.6 oz 175 lb  Weight (kg) 82.101 kg 80.559 kg 79.379 kg     There is no height or weight on file to calculate BMI.  General: Well developed, well nourished, in no acute distress. Head: Normocephalic, atraumatic, sclera non-icteric, no  xanthomas, nares are without discharge. Neck: Negative for carotid bruits. JVP not elevated. Lungs: Clear bilaterally to auscultation without wheezes, rales, or rhonchi. Breathing is unlabored. Heart: RRR S1 S2 without murmurs, rubs, or gallops.  Abdomen: Soft, non-tender, non-distended with normoactive bowel sounds. No rebound/guarding. Extremities: No clubbing or cyanosis. No edema. Distal pedal pulses are 2+ and equal bilaterally. Neuro: Alert and oriented X 3. Moves all extremities spontaneously. Psych:  Responds to questions appropriately with a normal affect.    EKG:  The ECG that was done today was personally reviewed and demonstrates NSR 81bpm, with new inferior Q waves and ST-TW changes inferiorly, with ST depression/TWI changes I, avL, V4-V6  Relevant CV Studies: Pending repeat echo  Laboratory Data:  High Sensitivity Troponin:   Recent Labs  Lab 06/04/23 1232  TROPONINIHS 13,045*       Chemistry Recent Labs  Lab 06/04/23 1232  NA 132*  K 3.7  CL 101  CO2 25  GLUCOSE 119*  BUN 10  CREATININE 0.96  CALCIUM 11.6*  GFRNONAA >60  ANIONGAP 6    No results for input(s): "PROT", "ALBUMIN", "AST", "ALT", "ALKPHOS", "BILITOT" in the last 168 hours. Lipids No results for input(s): "CHOL", "TRIG", "HDL", "LABVLDL", "LDLCALC", "CHOLHDL" in the last 168 hours. Hematology Recent Labs  Lab 06/04/23 1232  WBC 9.1  RBC 4.40  HGB 13.4  HCT 40.7  MCV 92.5  MCH 30.5  MCHC 32.9  RDW 11.8  PLT 263   Thyroid No results for input(s): "TSH", "FREET4" in the last 168 hours. BNPNo results for input(s): "BNP", "PROBNP" in the last 168 hours.  DDimer No results for input(s): "DDIMER" in the last 168 hours.   Radiology/Studies:  DG Chest 2 View Result Date: 06/04/2023 CLINICAL DATA:  One day history of increasing left arm pain EXAM: CHEST - 2 VIEW COMPARISON:  Chest radiograph dated 07/20/2022, CT chest dated 06/07/2022 FINDINGS: Normal lung volumes. Scattered patchy peripheral upper lung predominant opacities. No pleural effusion or pneumothorax. The heart size and mediastinal contours are within normal limits. Cortical irregularity of the right posterior seventh rib. Unchanged compression deformity of T10. IMPRESSION: 1. Scattered patchy peripheral upper lung predominant opacities, which may represent pulmonary nodules. Recommend further evaluation with nonemergent CT chest. 2. Cortical irregularity of the right posterior seventh rib, which may represent a nondisplaced fracture. Recommend correlation with point tenderness. Electronically Signed   By: Agustin Cree M.D.   On: 06/04/2023 14:54     Assessment and Plan:   1. Left arm pain, elevated troponin, abnormal EKG concerning for recent inferior MI - symptoms started 06/02/23, ongoing since then - no CP, SOB, nausea, diaphoresis - possible inferior wall motion abnormality seen on echocardiogram, no pericardial effusion - symptoms  do not remind patient of previous pericarditis - per d/w Dr. Duke Salvia, proceed to expedited [facility] for further evaluation - formal echo read pending - further recs pending cath results  Informed Consent   Shared Decision Making/Informed Consent The risks [stroke (1 in 1000), death (1 in 1000), kidney failure [usually temporary] (1 in 500), bleeding (1 in 200), allergic reaction [possibly serious] (1 in 200)], benefits (diagnostic support and management of coronary artery disease) and alternatives of a cardiac catheterization were discussed in detail with Mr. Kristiansen and he is willing to proceed.     2. Panhypopituitarism, hypothyroidism, hyperparathyroidism, hypercalcemia - on hydrocortisone 5mg  - takes 2 tablets in the morning and 1 between 4 pm and 6 pm by mouth twice daily - took today -  on levothyroxine 100mg  daily - took today - on vit D3 50,000 weekly on Sundays - per d/w Dr. Duke Salvia, hold off stress dose steroids, continue home regimen  3. Abnormal CXR - d/w MD, plan non urgent CT when more stable - no TB symptoms - no recent injury per patient  Risk Assessment/Risk Scores:    TIMI Risk Score for Unstable Angina or Non-ST Elevation MI:   The patient's TIMI risk score is  , which indicates a  % risk of all cause mortality, new or recurrent myocardial infarction or need for urgent revascularization in the next 14 days.   Code Status: Full Code  Severity of Illness: The appropriate patient status for this patient is INPATIENT. Inpatient status is judged to be reasonable and necessary in order to provide the required intensity of service to ensure the patient's safety. The patient's presenting symptoms, physical exam findings, and initial radiographic and laboratory data in the context of their chronic comorbidities is felt to place them at high risk for further clinical deterioration. Furthermore, it is not anticipated that the patient will be medically stable for discharge from the  hospital within 2 midnights of admission.   * I certify that at the point of admission it is my clinical judgment that the patient will require inpatient hospital care spanning beyond 2 midnights from the point of admission due to high intensity of service, high risk for further deterioration and high frequency of surveillance required.*   For questions or updates, please contact [facility] Please consult [email or web address] for contact info under     Signed, BECK COFER, PA-C  06/04/2023 3:02 PM

## 2023-06-04 NOTE — ED Triage Notes (Signed)
 Pt is here for left arm pain which has been increasing since yesterday.  No CP with this.  Pt states that when he is more active the pain is increased.

## 2023-06-04 NOTE — ED Notes (Signed)
 CRITICAL VALUE STICKER  CRITICAL VALUE: 13045 troponin  RECEIVER (on-site recipient of call):  DATE & TIME NOTIFIED: 1:47 PM   MESSENGER (representative from lab):  MD NOTIFIED: Jeraldine Loots  TIME OF NOTIFICATION: 1:47 PM  RESPONSE: ;

## 2023-06-04 NOTE — Interval H&P Note (Signed)
 History and Physical Interval Note:  06/04/2023 3:50 PM  [name]  has presented today for surgery, with the diagnosis of nstemi.  The various methods of treatment have been discussed with the patient and family. After consideration of risks, benefits and other options for treatment, the patient has consented to  Procedure(s): LEFT HEART CATH AND CORONARY ANGIOGRAPHY (N/A) as a surgical intervention.  The patient's history has been reviewed, patient examined, no change in status, stable for surgery.  I have reviewed the patient's chart and labs.  Questions were answered to the patient's satisfaction.   Cath Lab Visit (complete for each Cath Lab visit)  Clinical Evaluation Leading to the Procedure:   ACS: Yes.    Non-ACS:    Anginal Classification: CCS IV  Anti-ischemic medical therapy: No Therapy  Non-Invasive Test Results: No non-invasive testing performed  Prior CABG: No previous CABG        Theron Arista [facility] 06/04/2023 3:50 PM

## 2023-06-05 ENCOUNTER — Other Ambulatory Visit (HOSPITAL_COMMUNITY): Payer: Self-pay

## 2023-06-05 ENCOUNTER — Telehealth (HOSPITAL_COMMUNITY): Payer: Self-pay | Admitting: Pharmacy Technician

## 2023-06-05 DIAGNOSIS — E785 Hyperlipidemia, unspecified: Secondary | ICD-10-CM

## 2023-06-05 DIAGNOSIS — I25110 Atherosclerotic heart disease of native coronary artery with unstable angina pectoris: Secondary | ICD-10-CM | POA: Diagnosis not present

## 2023-06-05 DIAGNOSIS — Z955 Presence of coronary angioplasty implant and graft: Secondary | ICD-10-CM | POA: Diagnosis not present

## 2023-06-05 DIAGNOSIS — I214 Non-ST elevation (NSTEMI) myocardial infarction: Secondary | ICD-10-CM | POA: Diagnosis not present

## 2023-06-05 DIAGNOSIS — E039 Hypothyroidism, unspecified: Secondary | ICD-10-CM | POA: Diagnosis not present

## 2023-06-05 LAB — BASIC METABOLIC PANEL WITH GFR
Anion gap: 10 (ref 5–15)
BUN: 10 mg/dL (ref 6–20)
CO2: 22 mmol/L (ref 22–32)
Calcium: 11.8 mg/dL — ABNORMAL HIGH (ref 8.9–10.3)
Chloride: 102 mmol/L (ref 98–111)
Creatinine, Ser: 0.96 mg/dL (ref 0.61–1.24)
GFR, Estimated: >60 mL/min (ref >60)
Glucose, Bld: 94 mg/dL (ref 70–99)
Potassium: 3.8 mmol/L (ref 3.5–5.1)
Sodium: 134 mmol/L — ABNORMAL LOW (ref 135–145)

## 2023-06-05 LAB — HEPATIC FUNCTION PANEL
ALT: 40 U/L (ref 0–44)
AST: 109 U/L — ABNORMAL HIGH (ref 15–41)
Albumin: 3.3 g/dL — ABNORMAL LOW (ref 3.5–5.0)
Alkaline Phosphatase: 62 U/L (ref 38–126)
Bilirubin, Direct: 0.2 mg/dL (ref 0.0–0.2)
Indirect Bilirubin: 1 mg/dL — ABNORMAL HIGH (ref 0.3–0.9)
Total Bilirubin: 1.2 mg/dL (ref 0.0–1.2)
Total Protein: 6.6 g/dL (ref 6.5–8.1)

## 2023-06-05 LAB — LIPID PANEL
Cholesterol: 177 mg/dL (ref 0–200)
HDL: 35 mg/dL — ABNORMAL LOW (ref >40)
LDL Cholesterol: 117 mg/dL — ABNORMAL HIGH (ref 0–99)
Total CHOL/HDL Ratio: 5.1 ratio
Triglycerides: 125 mg/dL (ref <150)
VLDL: 25 mg/dL (ref 0–40)

## 2023-06-05 LAB — CBC
HCT: 39.6 % (ref 39.0–52.0)
Hemoglobin: 13.1 g/dL (ref 13.0–17.0)
MCH: 30 pg (ref 26.0–34.0)
MCHC: 33.1 g/dL (ref 30.0–36.0)
MCV: 90.8 fL (ref 80.0–100.0)
Platelets: 262 10*3/uL (ref 150–400)
RBC: 4.36 MIL/uL (ref 4.22–5.81)
RDW: 11.9 % (ref 11.5–15.5)
WBC: 8.1 10*3/uL (ref 4.0–10.5)
nRBC: 0 % (ref 0.0–0.2)

## 2023-06-05 LAB — HEMOGLOBIN A1C
Hgb A1c MFr Bld: 4.8 % (ref 4.8–5.6)
Mean Plasma Glucose: 91.06 mg/dL

## 2023-06-05 LAB — HIV ANTIBODY (ROUTINE TESTING W REFLEX): HIV Screen 4th Generation wRfx: NONREACTIVE

## 2023-06-05 LAB — POCT ACTIVATED CLOTTING TIME: Activated Clotting Time: 389 s

## 2023-06-05 MED ORDER — METOPROLOL SUCCINATE ER 25 MG PO TB24
25.0000 mg | ORAL_TABLET | Freq: Every day | ORAL | Status: DC
  Administered 2023-06-05: 25 mg via ORAL
  Filled 2023-06-05 (×2): qty 1

## 2023-06-05 MED ORDER — LOSARTAN POTASSIUM 25 MG PO TABS
12.5000 mg | ORAL_TABLET | Freq: Every day | ORAL | Status: DC
  Administered 2023-06-05: 12.5 mg via ORAL
  Filled 2023-06-05 (×2): qty 1

## 2023-06-05 NOTE — Discharge Instructions (Signed)
 Information about your medication: Effient (anti-platelet agent)  Generic Name (Brand): prasugrel (Effient), once daily medication  PURPOSE: You are taking this medication along with aspirin to lower your chance of having a heart attack, stroke, or blood clots in your heart stent. These can be fatal. Effient and aspirin help prevent platelets from sticking together and forming a clot that can block an artery or your stent.   Common SIDE EFFECTS you may experience include: bruising or bleeding more easily, shortness of breath  Do not stop taking EFFIENT without talking to the doctor who prescribes it for you. People who are treated with a stent and stop taking Effient too soon, have a higher risk of getting a blood clot in the stent, having a heart attack, or dying. If you stop Effient because of bleeding, or for other reasons, your risk of a heart attack or stroke may increase.   Avoid taking NSAID agents or anti-inflammatory medications such as ibuprofen, naproxen given increased bleed risk with Effient - can use acetaminophen (Tylenol) if needed for pain.  Tell all of your doctors and dentists that you are taking Effient. They should talk to the doctor who prescribed Effient for you before you have any surgery or invasive procedure.   Contact your health care provider if you experience: severe or uncontrollable bleeding, pink/red/Murry urine, vomiting blood or vomit that looks like "coffee grounds", red or black stools (looks like tar), coughing up blood or blood clots ----------------------------------------------------------------------------------------------------------------------

## 2023-06-05 NOTE — Progress Notes (Signed)
 CARDIAC REHAB PHASE I   PRE:  Rate/Rhythm: 99 SR    BP: sitting 104/70    SpO2: 97 RA  MODE:  Ambulation: 430 ft   POST:  Rate/Rhythm: 94 SR    BP: sitting 106/72     SpO2: 97 RA  Tolerated well, no c/o. Discussed with pt and wife MI, stent, restrictions, Effient importance, diet, exercise, NTG and CRPII. Pt and wife receptive, many questions answered esp re diet. Will refer to [facility] CRPII.  [id number]   Ethelda Chick BS, ACSM-CEP 06/05/2023 10:17 AM

## 2023-06-05 NOTE — Plan of Care (Signed)
  Problem: Education: Goal: Understanding of CV disease, CV risk reduction, and recovery process will improve Outcome: Progressing Goal: Individualized Educational Video(s) Outcome: Progressing   Problem: Activity: Goal: Ability to return to baseline activity level will improve Outcome: Progressing   Problem: Cardiovascular: Goal: Ability to achieve and maintain adequate cardiovascular perfusion will improve Outcome: Progressing   

## 2023-06-05 NOTE — TOC CM/SW Note (Signed)
 Transition of Care [facility]) - Inpatient Brief Assessment   Patient Details  Name: Christopher Lara MRN: [id number] Date of Birth: [birth year]  Transition of Care [facility] CM/SW Contact:    Gala Lewandowsky, RN Phone Number: 06/05/2023, 3:35 PM   Clinical Narrative: Patient presented for chest pain- Nstemi. PTA patient was from home with spouse. No home health needs identified.     Transition of Care Asessment: Insurance and Status: Insurance coverage has been reviewed   Home environment has been reviewed: reviewed Prior level of function:: independent Prior/Current Home Services: No current home services Social Drivers of Health Review: SDOH reviewed no interventions necessary Readmission risk has been reviewed: Yes Transition of care needs: no transition of care needs at this time

## 2023-06-05 NOTE — Progress Notes (Addendum)
 Patient Name: Christopher Lara Date of Encounter: 06/05/2023 [facility] Cardiologist: Peter Swaziland, MD   Interval Summary  .    Patient reports feeling well this AM. Denies chest pain, shortness of breath. Right radial cath site is soft, nontender  Vital Signs .    Vitals:   06/04/23 1756 06/04/23 1958 06/04/23 2314 06/05/23 0502  BP:  117/82 114/79 117/80  Pulse:  91 95 87  Resp:  18 18 18   Temp:  98.2 F (36.8 C) 99.9 F (37.7 C) 98.7 F (37.1 C)  TempSrc:  Oral Oral Oral  SpO2:  100% 97% 97%  Weight: 75.8 kg   (P) 75.2 kg  Height: 5\' 8"  (1.727 m)       Intake/Output Summary (Last 24 hours) at 06/05/2023 0914 Last data filed at 06/05/2023 0504 Gross per 24 hour  Intake 14.73 ml  Output 400 ml  Net -385.27 ml      06/05/2023    5:02 AM 06/04/2023    5:56 PM 11/19/2022    8:28 AM  Last 3 Weights  Weight (lbs) 165 lb 11.2 oz 167 lb 181 lb  Weight (kg) 75.161 kg 75.751 kg 82.101 kg      Telemetry/ECG    NSR - Personally Reviewed  Physical Exam .   GEN: No acute distress.  Sitting comfortably in the armchair  Neck: No JVD Cardiac:  RRR, no murmurs, rubs, or gallops. Radial pulses 2+ bilaterally. Right radial cath site is soft, nontender. No bruising, swelling, or bleeding on exam  Respiratory: Clear to auscultation bilaterally. Normal WOB on room air  GI: Soft, nontender, non-distended  MS: No edema in BLE  Assessment & Plan .     NSTEMI  History of pericardial effusion - Patient presented with persistent left arm pain that have been going on for a few days.  In the ED, initial troponin elevated to 13,045.  Repeat troponin 11,941. - Patient has a history of pericardial effusion requiring pericardiocentesis in 03/2022.  Unfortunately had recurrence and required pericardial window in 05/2022 - Patient underwent stat limited echo in the ED to rule out recurrent effusion.  Echo showed EF 70-75%, no wall motion abnormalities, normal RV systolic function, no  evidence of pericardial effusion - Patient was admitted for treatment of NSTEMI.  Underwent cardiac catheterization yesterday that showed occlusion of the mid-distal RCA that was treated with 2 overlapping DES.  Otherwise, there was 60% proximal LAD, 80% second diagonal, 50% second marginal that will be managed medically. - Recommended DAPT for 1 year.  Continue aspirin 81 mg daily, Effient 10 mg daily  - Not able to start statin therapy with elevated CK. At DC, refer to lipid clinic for [id number]   Panhypopituitarism Hypothyroidism  Hyperparathyroidism  Hypercalcemia  - on hydrocortisone 5mg  - takes 2 tablets in the morning and 1 between 4 pm and 6 pm by mouth twice daily - on levothyroxine 100mg  daily - on vit D3 50,000 weekly on Sundays - per d/w Dr. Duke Salvia yesterday, hold off stress dose steroids, continue home regimen - Followed by endocrinology at [facility]   Elevated CK  - CK chronically elevated. 530 this admission, which is the lowest it has been in our system  - as above, no statin therapy  - Followed by Rheum as an outpatient  Abnormal CXR  - CXR on admission showed scattered patchy peripheral uller plung predominant opacities which may represent pulmonary nodules  - Needs nonemergent CT chest- instructed patient to follow  up with PCP   For questions or updates, please contact [facility] Please consult [email or web address] for contact info under        Signed, Jonita Albee, PA-C    Patient seen and examined. Agree with assessment and plan.  No recurrent chest pain.  I personally reviewed the catheterization images.  ACS secondary to total occlusion of the mid distal RCA.  Troponins had increased to 13,045; CK 530 with MI.  Echo showed normal LV function most likely contributed by collateralization to the distal RCA from LAD septal perforating arteries.  There was no recurrent pericardial effusion.  He has concomitant CAD with 60% proximal LAD stenosis and 80% second  diagonal stenosis.  Recommend long-term DAPT for at least 1 year.  Heart rate today sinus rhythm in the 80s.  Initiate beta-blocker therapy with metoprolol succinate 25 mg today.  Patient would be a good candidate for Repatha can be initiated as outpatient.  I have recommended the patient stay in the hospital today; day 1 status post MI.  Will tentatively plan discharge tomorrow if stable.   Lennette Bihari, MD, [facility] 06/05/2023 10:26 AM

## 2023-06-05 NOTE — Telephone Encounter (Signed)
 Patient Product/process development scientist completed.    The patient is insured through [employer]. Patient has [employer], may use a copay card, and/or apply for patient assistance if available.    Ran test claim for prasugrel (Effient) 10 mg and the current 30 day co-pay is $7.50.   This test claim was processed through [facility]- copay amounts may vary at other pharmacies due to pharmacy/plan contracts, or as the patient moves through the different stages of their insurance plan.     Roland Earl, CPHT Pharmacy Technician III Certified Patient Advocate [facility] Patient Advocate Team Direct Number: [phone]  Fax: [phone]

## 2023-06-05 NOTE — Plan of Care (Signed)
  Problem: Education: Goal: Understanding of CV disease, CV risk reduction, and recovery process will improve Outcome: Progressing Goal: Individualized Educational Video(s) Outcome: Progressing   Problem: Activity: Goal: Ability to return to baseline activity level will improve Outcome: Progressing   Problem: Cardiovascular: Goal: Ability to achieve and maintain adequate cardiovascular perfusion will improve Outcome: Progressing Goal: Vascular access site(s) Level 0-1 will be maintained Outcome: Progressing   Problem: Health Behavior/Discharge Planning: Goal: Ability to safely manage health-related needs after discharge will improve Outcome: Progressing   Problem: Education: Goal: Knowledge of General Education information will improve Description: Including pain rating scale, medication(s)/side effects and non-pharmacologic comfort measures Outcome: Progressing   Problem: Health Behavior/Discharge Planning: Goal: Ability to manage health-related needs will improve Outcome: Progressing   Problem: Clinical Measurements: Goal: Ability to maintain clinical measurements within normal limits will improve Outcome: Progressing Goal: Will remain free from infection Outcome: Progressing Goal: Diagnostic test results will improve Outcome: Progressing Goal: Respiratory complications will improve Outcome: Progressing Goal: Cardiovascular complication will be avoided Outcome: Progressing   Problem: Activity: Goal: Risk for activity intolerance will decrease Outcome: Progressing   Problem: Nutrition: Goal: Adequate nutrition will be maintained Outcome: Progressing   Problem: Coping: Goal: Level of anxiety will decrease Outcome: Progressing   Problem: Elimination: Goal: Will not experience complications related to bowel motility Outcome: Progressing Goal: Will not experience complications related to urinary retention Outcome: Progressing   Problem: Pain Managment: Goal:  General experience of comfort will improve and/or be controlled Outcome: Progressing   Problem: Safety: Goal: Ability to remain free from injury will improve Outcome: Progressing   Problem: Skin Integrity: Goal: Risk for impaired skin integrity will decrease Outcome: Progressing   Problem: Education: Goal: Understanding of cardiac disease, CV risk reduction, and recovery process will improve Outcome: Progressing Goal: Individualized Educational Video(s) Outcome: Progressing   Problem: Activity: Goal: Ability to tolerate increased activity will improve Outcome: Progressing   Problem: Cardiac: Goal: Ability to achieve and maintain adequate cardiovascular perfusion will improve Outcome: Progressing   Problem: Health Behavior/Discharge Planning: Goal: Ability to safely manage health-related needs after discharge will improve Outcome: Progressing

## 2023-06-06 ENCOUNTER — Telehealth: Payer: Self-pay | Admitting: Cardiology

## 2023-06-06 ENCOUNTER — Other Ambulatory Visit (HOSPITAL_COMMUNITY): Payer: Self-pay

## 2023-06-06 DIAGNOSIS — E039 Hypothyroidism, unspecified: Secondary | ICD-10-CM | POA: Diagnosis not present

## 2023-06-06 DIAGNOSIS — I25110 Atherosclerotic heart disease of native coronary artery with unstable angina pectoris: Secondary | ICD-10-CM | POA: Diagnosis not present

## 2023-06-06 DIAGNOSIS — I214 Non-ST elevation (NSTEMI) myocardial infarction: Secondary | ICD-10-CM | POA: Diagnosis not present

## 2023-06-06 DIAGNOSIS — E785 Hyperlipidemia, unspecified: Secondary | ICD-10-CM | POA: Diagnosis not present

## 2023-06-06 LAB — BASIC METABOLIC PANEL WITH GFR
Anion gap: 8 (ref 5–15)
BUN: 18 mg/dL (ref 6–20)
CO2: 23 mmol/L (ref 22–32)
Calcium: 12.6 mg/dL — ABNORMAL HIGH (ref 8.9–10.3)
Chloride: 105 mmol/L (ref 98–111)
Creatinine, Ser: 0.95 mg/dL (ref 0.61–1.24)
GFR, Estimated: >60 mL/min (ref >60)
Glucose, Bld: 94 mg/dL (ref 70–99)
Potassium: 4.2 mmol/L (ref 3.5–5.1)
Sodium: 136 mmol/L (ref 135–145)

## 2023-06-06 LAB — LIPOPROTEIN A (LPA): Lipoprotein (a): 72.3 nmol/L — ABNORMAL HIGH (ref <75.0)

## 2023-06-06 MED ORDER — NITROGLYCERIN 0.4 MG SL SUBL
0.4000 mg | SUBLINGUAL_TABLET | SUBLINGUAL | 3 refills | Status: AC | PRN
  Filled 2023-06-06: qty 25, 5d supply, fill #0

## 2023-06-06 MED ORDER — PRASUGREL HCL 10 MG PO TABS
10.0000 mg | ORAL_TABLET | Freq: Every day | ORAL | 3 refills | Status: DC
  Filled 2023-06-06: qty 90, 90d supply, fill #0

## 2023-06-06 MED ORDER — ASPIRIN 81 MG PO TBEC
81.0000 mg | DELAYED_RELEASE_TABLET | Freq: Every day | ORAL | 3 refills | Status: DC
Start: 2023-06-06 — End: 2023-08-16
  Filled 2023-06-06: qty 90, 90d supply, fill #0

## 2023-06-06 MED ORDER — LOSARTAN POTASSIUM 25 MG PO TABS
12.5000 mg | ORAL_TABLET | Freq: Every day | ORAL | 11 refills | Status: DC
  Filled 2023-06-06: qty 15, 30d supply, fill #0

## 2023-06-06 NOTE — Discharge Summary (Addendum)
 Discharge Summary    Patient ID: Christopher Lara MRN: [id number]; DOB: 1975-03-09  Admit date: 06/04/2023 Discharge date: 06/06/2023  PCP:  Georganna Skeans, MD   [facility] Providers Cardiologist:  Peter Swaziland, MD     Discharge Diagnoses    Principal Problem:   NSTEMI (non-ST elevated myocardial infarction) [facility]) Active Problems:   Non-ST elevation (NSTEMI) myocardial infarction [facility])   Status post coronary artery stent placement   Hyperlipidemia with target low density lipoprotein (LDL) cholesterol less than 55 mg/dL   Hypothyroidism   Coronary artery disease involving native coronary artery of native heart with unstable angina pectoris [facility])  Diagnostic Studies/Procedures    Cath: 06/04/2023    Mid RCA to Dist RCA lesion is 100% stenosed.   Prox LAD lesion is 60% stenosed.   2nd Diag lesion is 80% stenosed.   2nd Mrg lesion is 50% stenosed.   A drug-eluting stent was successfully placed using a SYNERGY XD 2.50X38.   A drug-eluting stent was successfully placed using a SYNERGY XD 3.0X24.   Post intervention, there is a 0% residual stenosis.   LV end diastolic pressure is normal.   Recommend uninterrupted dual antiplatelet therapy with Aspirin 81mg  daily and Prasugrel 10mg  daily for a minimum of 12 months (ACS-Class I recommendation).   Culprit vessel - occlusion of mid- distal RCA Moderate nonobstructive CAD in proximal LAD (RFR 0.93) and second OM. Small vessel disease in second diagonal Normal LVEDP Successful PCI of the mid to distal RCA with DES x 2 overlapping   Plan: DAPT for one year. Risk factor modification. Anticipate DC in am.  Diagnostic Dominance: Right  Intervention    Echo: 06/04/2023  IMPRESSIONS     1. Left ventricular ejection fraction, by estimation, is 70 to 75%. The  left ventricle has hyperdynamic function. The left ventricle has no  regional wall motion abnormalities. Left ventricular diastolic parameters  were normal.   2.  Right ventricular systolic function is normal. The right ventricular  size is normal.   3. The mitral valve is normal in structure. No evidence of mitral valve  regurgitation. No evidence of mitral stenosis.   4. The aortic valve is tricuspid. Aortic valve regurgitation is not  visualized. No aortic stenosis is present.   5. The inferior vena cava is normal in size with <50% respiratory  variability, suggesting right atrial pressure of 8 mmHg.   Comparison(s): Prior images reviewed side by side. No pericardial  effusion.   FINDINGS   Left Ventricle: Left ventricular ejection fraction, by estimation, is 70  to 75%. The left ventricle has hyperdynamic function. The left ventricle  has no regional wall motion abnormalities. Definity contrast agent was  given IV to delineate the left  ventricular endocardial borders. Strain was performed and the global  longitudinal strain is indeterminate. The left ventricular internal cavity  size was normal in size. There is no left ventricular hypertrophy. Left  ventricular diastolic parameters were  normal.   Right Ventricle: The right ventricular size is normal. No increase in  right ventricular wall thickness. Right ventricular systolic function is  normal.   Left Atrium: Left atrial size was normal in size.   Right Atrium: Right atrial size was normal in size.   Pericardium: There is no evidence of pericardial effusion.   Mitral Valve: The mitral valve is normal in structure. No evidence of  mitral valve regurgitation. No evidence of mitral valve stenosis.   Tricuspid Valve: The tricuspid valve is normal  in structure. Tricuspid  valve regurgitation is trivial. No evidence of tricuspid stenosis.   Aortic Valve: The aortic valve is tricuspid. Aortic valve regurgitation is  not visualized. No aortic stenosis is present. Aortic valve mean gradient  measures 4.0 mmHg. Aortic valve peak gradient measures 7.7 mmHg. Aortic  valve area, by VTI  measures 3.77  cm.   Pulmonic Valve: The pulmonic valve was normal in structure. Pulmonic valve  regurgitation is not visualized. No evidence of pulmonic stenosis.   Aorta: The aortic root and ascending aorta are structurally normal, with  no evidence of dilitation.   Venous: The inferior vena cava is normal in size with less than 50%  respiratory variability, suggesting right atrial pressure of 8 mmHg.   IAS/Shunts: The interatrial septum was not well visualized.   Additional Comments: 3D was performed not requiring image post processing  on an independent workstation and was indeterminate.   _____________   History of Present Illness     Christopher Lara is a 48 y.o. male with previous severe hyponatremia, Covid pneumonia in 02/2019, large pericardial effusion in 03/2022 requiring pericardiocentesis then later pericardial window 05/2022, kidney stone, panhypopituitarism, hypothyroidism, hyperparathyroidism, hypercalcemia who was seen 06/04/2023 for the evaluation of marked troponin elevation.   Christopher Lara was previously admitted in 02/2019 with severe hyponatremia to 108 in setting of Covid PNA and poor oral intake. He was seen at [facility] ED in December 2022 for syncopal episode that occurred after he took a hot shower.  He developed tunnel vision and a sensation that things were going dark before passed out and hit his face in the fall. He was prescribed phosphate supplement and recommend outpatient follow-up with PCP. Our team met him during admission 03/2022 when he again presented with confusion and hyponatremia of 105 felt due to combination of Covid infection (recent rx with Paxlovid in weeks before), non-traumatic rhabdomyolysis of unclear etiology, and large pericardial effusion requiring pericardiocentesis. F/u outpatient limited echo 05/23/22 showed recurrent large pericardial effusion. He was referred to CT surgery but before being seen, he was admitted to the hospital 05/2022 with chest  pain. He ultimately underwent pericardial window by Dr. Cliffton Asters. He was treated with colchicine x3 months, had diarrhea with the BID dosing. He was doing great when seen back in follow-up 07/2022. Pertinent additional labs include pericardial fluid cytology with chronic inflammation (03/2022), pericardial biopsy benign with mild fibrosis/reactive changes (05/2022), negative ANA (10/2022), persistently elevated CK level of 1592 (10/2022). 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 He was treated with hydrocortisone. 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 His [facility] med list included vitamin D, levothyroxine, and hydrocortisone. He reported compliance with these.   He has been in [facility] lately, feeling good, no fever, chills, night sweats, chest pain or rib pain. On Sunday afternoon prior to admission he developed persistent left arm pain that continued, occasionally waxing and waning without specific provoking/alleviating factors, though today. He did try Tylenol. He did not have any chest pain, SOB, nausea, vomiting with this. Because of his cardiac history and the persistence of discomfort, he came to the ED where initial troponin was 13,045. Labs show Na 132, glucose 119, calcium 11.6, troponin 13,045->11,941, CBC wnl, CK 503. EKG shows NSR 81bpm, with inferior Q waves and ST-TW changes inferiorly, with ST depression/TWI changes I, avL, V4-V6. Case was reviewed with Dr. Swaziland who recommended check stat echo to assess for WMA  to help define ACS vs pericarditis. He received 324mg  ASA. CXR showed scattered patchy peripheral upper lung predominant opacities, which may represent pulmonary nodules. Recommend further evaluation with nonemergent CT chest, as well as cortical irregularity of the right posterior seventh rib, which may represent a nondisplaced fracture. He denied any recent trauma or illness. Given  concern for ACS, he was taken for cardiac cath.   Hospital Course     NSTEMI -- Presented with persistent left arm pain ongoing for few days prior to admission.  In the ED his initial troponin was noted to be at 13,045>>11941 -- Underwent stat limited echo to rule out recurrent effusion with EF showing 70 to 75% no wall motion abnormalities, normal RV, no pericardial effusion -- Underwent cardiac catheterization which showed occlusion of mid to distal RCA treated with overlapping DES x 2.  Residual 60% proximal LAD, 80% second diagonal, 50% second OM to be treated medically.  Recommendations for DAPT with aspirin/Effient for at least 1 year -- Continue aspirin, Effient, unable to use statin therapy with chronically elevated CK.  Will plan to refer to lipid clinic as an outpatient for consideration of PCSK9 -- attempted to add Toprol XL 25mg  along with losartan 12.5mg  daily. Unable to tolerate both, therefore Toprol XL stopped at discharge   Panhypopituitarism Hypothyroidism  Hyperparathyroidism  Hypercalcemia  -- on hydrocortisone 5mg  - takes 2 tablets in the morning and 1 between 4 pm and 6 pm by mouth twice daily -- continue levothyroxine 100mg  daily, vit D3 50,000 weekly on Sundays -- no plans for stress dose steroids, continue home regimen -- Followed [facility] at [facility]    Elevated CK  -- CK chronically elevated. 530 this admission, which is the lowest it has been in our system. For this reason, no statin therapy  -- Followed by Rheum as an outpatient   Abnormal CXR  -- CXR on admission showed scattered patchy peripheral uller plung predominant opacities which may represent pulmonary nodules  -- Needs nonemergent CT chest- instructed patient to follow up with PCP    General: Well developed, well nourished, male appearing in no acute distress. Head: Normocephalic, atraumatic.  Neck: Supple without bruits, JVD. Lungs:  Resp regular and unlabored, CTA. Heart: RRR, S1, S2, no  S3, S4, or murmur; no rub. Abdomen: Soft, non-tender, non-distended with normoactive bowel sounds.  Extremities: No clubbing, cyanosis, edema. Distal pedal pulses are 2+ bilaterally. Right radial cath site stable without bruising or hematoma Neuro: Alert and oriented X 3. Moves all extremities spontaneously. Psych: Normal affect.  Patient was seen by myself and Dr. Tresa Endo and deemed stable for discharge home. Follow up arranged in the office. Medications sent to the [facility] pharmacy.   Did the patient have an acute coronary syndrome (MI, NSTEMI, STEMI, etc) this admission?:  Yes                               AHA/ACC ACS Clinical Performance & Quality Measures: Aspirin prescribed? - Yes ADP Receptor Inhibitor (Plavix/Clopidogrel, Brilinta/Ticagrelor or Effient/Prasugrel) prescribed (includes medically managed patients)? - Yes Beta Blocker prescribed? - No. The patient has an EF >/= 50%. Based upon the [facility] Study pub in Apr 2024, there is no benefit in patients with preserved EF post MI. High Intensity Statin (Lipitor 40-80mg  or Crestor 20-40mg ) prescribed? - No - elevated CK EF assessed during THIS hospitalization? - Yes For EF <40%, was ACEI/ARB prescribed? - Not Applicable (EF >/=  40%) For EF <40%, Aldosterone Antagonist (Spironolactone or Eplerenone) prescribed? - Not Applicable (EF >/= 40%) Cardiac Rehab Phase II ordered (including medically managed patients)? - Yes    The patient will be scheduled for a TOC follow up appointment in 10-14 days.  A message has been sent to the [facility] and Scheduling Pool at the office where the patient should be seen for follow up.  _____________  Discharge Vitals Blood pressure 97/65, pulse 65, temperature 98.1 F (36.7 C), temperature source Oral, resp. rate 18, height 5\' 8"  (1.727 m), weight 74.5 kg, SpO2 100%.  Filed Weights   06/04/23 1756 06/05/23 0502 06/06/23 0500  Weight: 75.8 kg (P) 75.2 kg 74.5 kg    Labs & Radiologic Studies     CBC Recent Labs    06/04/23 1232 06/05/23 0426  WBC 9.1 8.1  HGB 13.4 13.1  HCT 40.7 39.6  MCV 92.5 90.8  PLT 263 262   Basic Metabolic Panel Recent Labs    82/95/62 0426 06/06/23 0506  NA 134* 136  K 3.8 4.2  CL 102 105  CO2 22 23  GLUCOSE 94 94  BUN 10 18  CREATININE 0.96 0.95  CALCIUM 11.8* 12.6*   Liver Function Tests Recent Labs    06/05/23 0426  AST 109*  ALT 40  ALKPHOS 62  BILITOT 1.2  PROT 6.6  ALBUMIN 3.3*   No results for input(s): "LIPASE", "AMYLASE" in the last 72 hours. High Sensitivity Troponin:   Recent Labs  Lab 06/04/23 1232 06/04/23 1420  TROPONINIHS 13,045* 11,941*    BNP Invalid input(s): "POCBNP" D-Dimer No results for input(s): "DDIMER" in the last 72 hours. Hemoglobin A1C Recent Labs    06/05/23 0426  HGBA1C 4.8   Fasting Lipid Panel Recent Labs    06/05/23 0426  CHOL 177  HDL 35*  LDLCALC 117*  TRIG 125  CHOLHDL 5.1   Thyroid Function Tests Recent Labs    06/04/23 1420  TSH 0.013*   _____________  CARDIAC CATHETERIZATION Result Date: 06/04/2023   Mid RCA to Dist RCA lesion is 100% stenosed.   Prox LAD lesion is 60% stenosed.   2nd Diag lesion is 80% stenosed.   2nd Mrg lesion is 50% stenosed.   A drug-eluting stent was successfully placed using a SYNERGY XD 2.50X38.   A drug-eluting stent was successfully placed using a SYNERGY XD 3.0X24.   Post intervention, there is a 0% residual stenosis.   LV end diastolic pressure is normal.   Recommend uninterrupted dual antiplatelet therapy with Aspirin 81mg  daily and Prasugrel 10mg  daily for a minimum of 12 months (ACS-Class I recommendation). Culprit vessel - occlusion of mid- distal RCA Moderate nonobstructive CAD in proximal LAD (RFR 0.93) and second OM. Small vessel disease in second diagonal Normal LVEDP Successful PCI of the mid to distal RCA with DES x 2 overlapping Plan: DAPT for one year. Risk factor modification. Anticipate DC in am.   ECHOCARDIOGRAM COMPLETE Result  Date: 06/04/2023    ECHOCARDIOGRAM REPORT   Patient Name:   Christopher Lara Date of Exam: 06/04/2023 Medical Rec #:  [id number]      Height:       65.5 in Accession #:    [id number]     Weight:       181.0 lb Date of Birth:  18-Apr-1975      BSA:          1.907 m Patient Age:    68 years  BP:           112/83 mmHg Patient Gender: M              HR:           84 bpm. Exam Location:  Inpatient Procedure: 2D Echo, Cardiac Doppler, Color Doppler and Intracardiac            Opacification Agent (Both Spectral and Color Flow Doppler were            utilized during procedure). Indications:    NSTEMI  History:        Patient has prior history of Echocardiogram examinations, most                 recent 06/08/2022. _ Pericardium: Pericardial Centesis on                 04/09/2022.  Sonographer:    Meagan Baucom RDCS, FE, PE Referring Phys: [id number] ROBERT LOCKWOOD IMPRESSIONS  1. Left ventricular ejection fraction, by estimation, is 70 to 75%. The left ventricle has hyperdynamic function. The left ventricle has no regional wall motion abnormalities. Left ventricular diastolic parameters were normal.  2. Right ventricular systolic function is normal. The right ventricular size is normal.  3. The mitral valve is normal in structure. No evidence of mitral valve regurgitation. No evidence of mitral stenosis.  4. The aortic valve is tricuspid. Aortic valve regurgitation is not visualized. No aortic stenosis is present.  5. The inferior vena cava is normal in size with <50% respiratory variability, suggesting right atrial pressure of 8 mmHg. Comparison(s): Prior images reviewed side by side. No pericardial effusion. FINDINGS  Left Ventricle: Left ventricular ejection fraction, by estimation, is 70 to 75%. The left ventricle has hyperdynamic function. The left ventricle has no regional wall motion abnormalities. Definity contrast agent was given IV to delineate the left ventricular endocardial borders. Strain was performed and the global  longitudinal strain is indeterminate. The left ventricular internal cavity size was normal in size. There is no left ventricular hypertrophy. Left ventricular diastolic parameters were normal. Right Ventricle: The right ventricular size is normal. No increase in right ventricular wall thickness. Right ventricular systolic function is normal. Left Atrium: Left atrial size was normal in size. Right Atrium: Right atrial size was normal in size. Pericardium: There is no evidence of pericardial effusion. Mitral Valve: The mitral valve is normal in structure. No evidence of mitral valve regurgitation. No evidence of mitral valve stenosis. Tricuspid Valve: The tricuspid valve is normal in structure. Tricuspid valve regurgitation is trivial. No evidence of tricuspid stenosis. Aortic Valve: The aortic valve is tricuspid. Aortic valve regurgitation is not visualized. No aortic stenosis is present. Aortic valve mean gradient measures 4.0 mmHg. Aortic valve peak gradient measures 7.7 mmHg. Aortic valve area, by VTI measures 3.77 cm. Pulmonic Valve: The pulmonic valve was normal in structure. Pulmonic valve regurgitation is not visualized. No evidence of pulmonic stenosis. Aorta: The aortic root and ascending aorta are structurally normal, with no evidence of dilitation. Venous: The inferior vena cava is normal in size with less than 50% respiratory variability, suggesting right atrial pressure of 8 mmHg. IAS/Shunts: The interatrial septum was not well visualized. Additional Comments: 3D was performed not requiring image post processing on an independent workstation and was indeterminate.  LEFT VENTRICLE PLAX 2D LVIDd:         3.70 cm   Diastology LVIDs:         2.30 cm   LV  e' medial:    7.77 cm/s LV PW:         1.00 cm   LV E/e' medial:  10.0 LV IVS:        1.10 cm   LV e' lateral:   14.60 cm/s LVOT diam:     2.30 cm   LV E/e' lateral: 5.3 LV SV:         86 LV SV Index:   45 LVOT Area:     4.15 cm  RIGHT VENTRICLE RV Basal  diam:  2.70 cm RV Mid diam:    2.60 cm RV S prime:     8.70 cm/s TAPSE (M-mode): 1.2 cm LEFT ATRIUM             Index        RIGHT ATRIUM           Index LA diam:        3.50 cm 1.84 cm/m   RA Area:     13.50 cm LA Vol (A2C):   44.0 ml 23.08 ml/m  RA Volume:   29.90 ml  15.68 ml/m LA Vol (A4C):   35.0 ml 18.36 ml/m LA Biplane Vol: 40.6 ml 21.29 ml/m  AORTIC VALVE AV Area (Vmax):    3.71 cm AV Area (Vmean):   3.56 cm AV Area (VTI):     3.77 cm AV Vmax:           139.00 cm/s AV Vmean:          93.500 cm/s AV VTI:            0.229 m AV Peak Grad:      7.7 mmHg AV Mean Grad:      4.0 mmHg LVOT Vmax:         124.00 cm/s LVOT Vmean:        80.200 cm/s LVOT VTI:          0.208 m LVOT/AV VTI ratio: 0.91  AORTA Ao Root diam: 3.10 cm Ao Asc diam:  3.00 cm MITRAL VALVE               TRICUSPID VALVE MV Area (PHT): 3.85 cm    TR Peak grad:   6.2 mmHg MV Decel Time: 197 msec    TR Vmax:        124.00 cm/s MV E velocity: 77.70 cm/s MV A velocity: 72.20 cm/s  SHUNTS MV E/A ratio:  1.08        Systemic VTI:  0.21 m                            Systemic Diam: 2.30 cm Riley Lam MD Electronically signed by Riley Lam MD Signature Date/Time: 06/04/2023/3:49:16 PM    Final    DG Chest 2 View Result Date: 06/04/2023 CLINICAL DATA:  One day history of increasing left arm pain EXAM: CHEST - 2 VIEW COMPARISON:  Chest radiograph dated 07/20/2022, CT chest dated 06/07/2022 FINDINGS: Normal lung volumes. Scattered patchy peripheral upper lung predominant opacities. No pleural effusion or pneumothorax. The heart size and mediastinal contours are within normal limits. Cortical irregularity of the right posterior seventh rib. Unchanged compression deformity of T10. IMPRESSION: 1. Scattered patchy peripheral upper lung predominant opacities, which may represent pulmonary nodules. Recommend further evaluation with nonemergent CT chest. 2. Cortical irregularity of the right posterior seventh rib, which may represent a  nondisplaced fracture. Recommend correlation with point tenderness. Electronically Signed  By: Agustin Cree M.D.   On: 06/04/2023 14:54   Disposition   Pt is being discharged home today in good condition.  Follow-up Plans & Appointments     Discharge Instructions     AMB Referral to [facility] Pharm[facility]   Complete by: As directed    Elevated CK   Reason For Referral: Lipids Comment - post ACS discharge- to be seen within 2 weeks   Amb Referral to Cardiac Rehabilitation   Complete by: As directed    Diagnosis:  Coronary Stents NSTEMI PTCA     After initial evaluation and assessments completed: Virtual Based Care may be provided alone or in conjunction with Phase 2 Cardiac Rehab based on patient barriers.: Yes   Intensive Cardiac Rehabilitation (ICR) [facility] location only OR Traditional Cardiac Rehabilitation (TCR) *If criteria for ICR are not met will enroll in TCR [facility] only): Yes   Call MD for:  difficulty breathing, headache or visual disturbances   Complete by: As directed    Call MD for:  persistant dizziness or light-headedness   Complete by: As directed    Call MD for:  redness, tenderness, or signs of infection (pain, swelling, redness, odor or green/yellow discharge around incision site)   Complete by: As directed    Diet - low sodium heart healthy   Complete by: As directed    Discharge instructions   Complete by: As directed    Radial Site Care Refer to this sheet in the next few weeks. These instructions provide you with information on caring for yourself after your procedure. Your caregiver may also give you more specific instructions. Your treatment has been planned according to current medical practices, but problems sometimes occur. Call your caregiver if you have any problems or questions after your procedure. HOME CARE INSTRUCTIONS You may shower the day after the procedure. Remove the bandage (dressing) and gently wash the site with plain soap and water. Gently pat the  site dry.  Do not apply powder or lotion to the site.  Do not submerge the affected site in water for 3 to 5 days.  Inspect the site at least twice daily.  Do not flex or bend the affected arm for 24 hours.  No lifting over 5 pounds (2.3 kg) for 5 days after your procedure.  Do not drive home if you are discharged the same day of the procedure. Have someone else drive you.  You may drive 24 hours after the procedure unless otherwise instructed by your caregiver.  What to expect: Any bruising will usually fade within 1 to 2 weeks.  Blood that collects in the tissue (hematoma) may be painful to the touch. It should usually decrease in size and tenderness within 1 to 2 weeks.  SEEK IMMEDIATE MEDICAL CARE IF: You have unusual pain at the radial site.  You have redness, warmth, swelling, or pain at the radial site.  You have drainage (other than a small amount of blood on the dressing).  You have chills.  You have a fever or persistent symptoms for more than 72 hours.  You have a fever and your symptoms suddenly get worse.  Your arm becomes pale, cool, tingly, or numb.  You have heavy bleeding from the site. Hold pressure on the site.   PLEASE DO NOT MISS ANY DOSES OF YOUR EFFIENT!!!!! Also keep a log of you blood pressures and bring back to your follow up appt. Please call the office with any questions.   Patients taking blood  thinners should generally stay away from medicines like ibuprofen, Advil, Motrin, naproxen, and Aleve due to risk of stomach bleeding. You may take Tylenol as directed or talk to your primary doctor about alternatives.   PLEASE ENSURE THAT YOU DO NOT RUN OUT OF YOUR EFFIENT. This medication is very important to remain on for at least one year. IF you have issues obtaining this medication due to cost please CALL the office 3-5 business days prior to running out in order to prevent missing doses of this medication.   Increase activity slowly   Complete by: As directed          Discharge Medications   Allergies as of 06/06/2023   No Known Allergies      Medication List     TAKE these medications    acetaminophen 650 MG CR tablet Commonly known as: TYLENOL Take 650 mg by mouth every 8 (eight) hours as needed for pain.   aspirin EC 81 MG tablet Take 1 tablet (81 mg total) by mouth daily. Swallow whole.   ergocalciferol 1.25 MG (50000 UT) capsule Commonly known as: VITAMIN D2 Take 50,000 Units by mouth once a week. On Sundays   hydrocortisone 5 MG tablet Commonly known as: CORTEF Take 15 mg by mouth in the morning.   levothyroxine 100 MCG tablet Commonly known as: SYNTHROID Take 100 mcg by mouth every morning.   losartan 25 MG tablet Commonly known as: COZAAR Take 0.5 tablets (12.5 mg total) by mouth daily.   nitroGLYCERIN 0.4 MG SL tablet Commonly known as: NITROSTAT Place 1 tablet (0.4 mg total) under the tongue every 5 (five) minutes x 3 doses as needed for chest pain.   prasugrel 10 MG Tabs tablet Commonly known as: EFFIENT Take 1 tablet (10 mg total) by mouth daily.          Outstanding Labs/Studies   BMET at follow up appt  Duration of Discharge Encounter: APP Time: 25 minutes   Signed, Laverda Page, NP 06/06/2023, 11:16 AM   Patient seen and examined. Agree with assessment and plan.  Patient is status post successful stenting to his mid distal RCA.  He has concomitant CAD with 60% proximal LAD stenosis and 80% second diagonal stenosis.  Echo shows hyperdynamic LV function without wall motion abnormality.  Blood pressure this morning is low with systolic approximately 90.  Presently will hold metoprolol and continue losartan.  Continue DAPT with aspirin/prasugrel.  At site stable.  He continues to be on levothyroxine for hypothyroidism.  Consider outpatient initiation of Repatha with aggressive lipid-lowering therapy following his ACS.  Will plan discharge today with follow-up with Dr. Swaziland.  DC time 32  minutes Lennette Bihari, MD, [facility] 06/06/2023 11:23 AM

## 2023-06-06 NOTE — Plan of Care (Signed)
  Problem: Education: Goal: Understanding of CV disease, CV risk reduction, and recovery process will improve Outcome: Progressing Goal: Individualized Educational Video(s) Outcome: Progressing   Problem: Activity: Goal: Ability to return to baseline activity level will improve Outcome: Progressing   Problem: Cardiovascular: Goal: Ability to achieve and maintain adequate cardiovascular perfusion will improve Outcome: Progressing Goal: Vascular access site(s) Level 0-1 will be maintained Outcome: Progressing   Problem: Health Behavior/Discharge Planning: Goal: Ability to safely manage health-related needs after discharge will improve Outcome: Progressing   Problem: Education: Goal: Knowledge of General Education information will improve Description: Including pain rating scale, medication(s)/side effects and non-pharmacologic comfort measures Outcome: Progressing   Problem: Health Behavior/Discharge Planning: Goal: Ability to manage health-related needs will improve Outcome: Progressing   Problem: Clinical Measurements: Goal: Ability to maintain clinical measurements within normal limits will improve Outcome: Progressing Goal: Will remain free from infection Outcome: Progressing Goal: Diagnostic test results will improve Outcome: Progressing Goal: Respiratory complications will improve Outcome: Progressing Goal: Cardiovascular complication will be avoided Outcome: Progressing   Problem: Activity: Goal: Risk for activity intolerance will decrease Outcome: Progressing   Problem: Nutrition: Goal: Adequate nutrition will be maintained Outcome: Progressing   Problem: Coping: Goal: Level of anxiety will decrease Outcome: Progressing   Problem: Elimination: Goal: Will not experience complications related to bowel motility Outcome: Progressing Goal: Will not experience complications related to urinary retention Outcome: Progressing   Problem: Pain Managment: Goal:  General experience of comfort will improve and/or be controlled Outcome: Progressing   Problem: Safety: Goal: Ability to remain free from injury will improve Outcome: Progressing   Problem: Skin Integrity: Goal: Risk for impaired skin integrity will decrease Outcome: Progressing   Problem: Education: Goal: Understanding of cardiac disease, CV risk reduction, and recovery process will improve Outcome: Progressing Goal: Individualized Educational Video(s) Outcome: Progressing   Problem: Activity: Goal: Ability to tolerate increased activity will improve Outcome: Progressing   Problem: Cardiac: Goal: Ability to achieve and maintain adequate cardiovascular perfusion will improve Outcome: Progressing   Problem: Health Behavior/Discharge Planning: Goal: Ability to safely manage health-related needs after discharge will improve Outcome: Progressing

## 2023-06-06 NOTE — Plan of Care (Signed)
  Problem: Education: Goal: Understanding of CV disease, CV risk reduction, and recovery process will improve Outcome: Progressing   Problem: Activity: Goal: Ability to return to baseline activity level will improve Outcome: Progressing   Problem: Cardiovascular: Goal: Ability to achieve and maintain adequate cardiovascular perfusion will improve Outcome: Progressing Goal: Vascular access site(s) Level 0-1 will be maintained Outcome: Progressing   Problem: Education: Goal: Knowledge of General Education information will improve Description: Including pain rating scale, medication(s)/side effects and non-pharmacologic comfort measures Outcome: Progressing   Problem: Clinical Measurements: Goal: Ability to maintain clinical measurements within normal limits will improve Outcome: Progressing Goal: Will remain free from infection Outcome: Progressing

## 2023-06-06 NOTE — Telephone Encounter (Signed)
 Noted  Expected discharge 4/10  Nursing will complete hospital follow up 4/11

## 2023-06-06 NOTE — Telephone Encounter (Signed)
   Transition of Care Follow-up Phone Call Request    Patient Name: Christopher Lara Date of Birth: April 12, 1975 Date of Encounter: 06/06/2023  Primary Care Provider:  Georganna Skeans, MD Primary Cardiologist:  Peter Swaziland, MD  [name] has been scheduled for a transition of care follow up appointment with a Heart[facility] provider:  [facility] 4/21  Please reach out to [facility] [place] within 48 hours of discharge to confirm appointment and review transition of care protocol questionnaire. Anticipated discharge date: 4/10  Laverda Page, NP  06/06/2023, 11:04 AM

## 2023-06-10 ENCOUNTER — Telehealth: Payer: Self-pay

## 2023-06-10 NOTE — Transitions of Care (Post Inpatient/ED Visit) (Signed)
   06/10/2023  Name: Christopher Lara MRN: [id number] DOB: 1976-01-14  Today's TOC FU Call Status: Today's TOC FU Call Status:: Successful TOC FU Call Completed TOC FU Call Complete Date: 06/10/23 Patient's Name and Date of Birth confirmed.  Transition Care Management Follow-up Telephone Call Date of Discharge: 06/06/23 Discharge Facility: [facility]) Type of Discharge: Inpatient Admission Primary Inpatient Discharge Diagnosis:: NSTEMI How have you been since you were released from the hospital?: Better Any questions or concerns?: No  Items Reviewed: Did you receive and understand the discharge instructions provided?: Yes Medications obtained,verified, and reconciled?: No Medications Not Reviewed Reasons:: Other: (He said he has all of his medications and did not have any questions about the med regime and did not need to review the med list) Any new allergies since your discharge?: No Dietary orders reviewed?: Yes Type of Diet Ordered:: heart healthy, low sodium Do you have support at home?: Yes People in Home [RPT]: spouse Name of Support/Comfort Primary Source: his wife  Medications Reviewed Today: Medications Reviewed Today   Medications were not reviewed in this encounter     Home Care and Equipment/Supplies: Were Home Health Services Ordered?: No Any new equipment or medical supplies ordered?: No  Functional Questionnaire: Do you need assistance with bathing/showering or dressing?: No Do you need assistance with meal preparation?: No Do you need assistance with eating?: No Do you have difficulty maintaining continence: No Do you need assistance with getting out of bed/getting out of a chair/moving?: No Do you have difficulty managing or taking your medications?: No  Follow up appointments reviewed: PCP Follow-up appointment confirmed?: No MD Provider Line Number:[phone] Given: No (He said he does not have another PCP and he will call the clinic when he is  ready to schedule an appointment.) Specialist Hospital Follow-up appointment confirmed?: Yes Date of Specialist follow-up appointment?: 06/17/23 Follow-Up Specialty Provider:: cardiology Do you need transportation to your follow-up appointment?: No Do you understand care options if your condition(s) worsen?: Yes-patient verbalized understanding    SIGNATURE Burnett Carson, RN

## 2023-06-11 ENCOUNTER — Encounter (HOSPITAL_COMMUNITY): Payer: Self-pay

## 2023-06-11 DIAGNOSIS — I2510 Atherosclerotic heart disease of native coronary artery without angina pectoris: Secondary | ICD-10-CM | POA: Diagnosis not present

## 2023-06-11 DIAGNOSIS — E038 Other specified hypothyroidism: Secondary | ICD-10-CM | POA: Diagnosis not present

## 2023-06-11 DIAGNOSIS — E559 Vitamin D deficiency, unspecified: Secondary | ICD-10-CM | POA: Diagnosis not present

## 2023-06-11 DIAGNOSIS — E230 Hypopituitarism: Secondary | ICD-10-CM | POA: Diagnosis not present

## 2023-06-11 DIAGNOSIS — R700 Elevated erythrocyte sedimentation rate: Secondary | ICD-10-CM | POA: Diagnosis not present

## 2023-06-11 DIAGNOSIS — E213 Hyperparathyroidism, unspecified: Secondary | ICD-10-CM | POA: Diagnosis not present

## 2023-06-11 DIAGNOSIS — E871 Hypo-osmolality and hyponatremia: Secondary | ICD-10-CM | POA: Diagnosis not present

## 2023-06-13 ENCOUNTER — Telehealth: Payer: Self-pay | Admitting: Cardiology

## 2023-06-13 DIAGNOSIS — Z0279 Encounter for issue of other medical certificate: Secondary | ICD-10-CM

## 2023-06-13 NOTE — Telephone Encounter (Signed)
 Pt came into office to drop off FMLA paperwork. Pt completed ROI & Billing forms + paid $29 fee.  Staff msg sent to Dr. Swaziland, Cheryl P, & M.Fountain for clarification on provider to complete forms.  JB, 06-13-23

## 2023-06-13 NOTE — Telephone Encounter (Signed)
 Per Dr. Swaziland, FMLA forms left in M.Fountain's mailbox for completion at upcoming visit, 06-17-23.  JB, 06-13-23

## 2023-06-17 ENCOUNTER — Ambulatory Visit: Attending: Emergency Medicine | Admitting: Emergency Medicine

## 2023-06-17 ENCOUNTER — Encounter: Payer: Self-pay | Admitting: Emergency Medicine

## 2023-06-17 VITALS — BP 94/56 | HR 75 | Ht 69.0 in | Wt 167.0 lb

## 2023-06-17 DIAGNOSIS — Z9889 Other specified postprocedural states: Secondary | ICD-10-CM | POA: Diagnosis not present

## 2023-06-17 DIAGNOSIS — I214 Non-ST elevation (NSTEMI) myocardial infarction: Secondary | ICD-10-CM

## 2023-06-17 DIAGNOSIS — I25110 Atherosclerotic heart disease of native coronary artery with unstable angina pectoris: Secondary | ICD-10-CM | POA: Diagnosis not present

## 2023-06-17 DIAGNOSIS — R911 Solitary pulmonary nodule: Secondary | ICD-10-CM

## 2023-06-17 DIAGNOSIS — E782 Mixed hyperlipidemia: Secondary | ICD-10-CM

## 2023-06-17 NOTE — Patient Instructions (Addendum)
 Medication Instructions:  No medication changes were made during today's visit.  *If you need a refill on your cardiac medications before your next appointment, please call your pharmacy*   Lab Work: Labs will be drawn today. If you have labs (blood work) drawn today and your tests are completely normal, you will receive your results only by: MyChart Message (if you have MyChart) OR A paper copy in the mail If you have any lab test that is abnormal or we need to change your treatment, we will call you to review the results.   Testing/Procedures: Non-Cardiac CT scanning, (CAT scanning), is a noninvasive, special x-ray that produces cross-sectional images of the body using x-rays and a computer. CT scans help physicians diagnose and treat medical conditions. For some CT exams, a contrast material is used to enhance visibility in the area of the body being studied. CT scans provide greater clarity and reveal more details than regular x-ray exams.    Follow-Up: At [facility], you and your health needs are our priority.  As part of our continuing mission to provide you with exceptional heart care, we have created designated Provider Care Teams.  These Care Teams include your primary Cardiologist (physician) and Advanced Practice Providers (APPs -  Physician Assistants and Nurse Practitioners) who all work together to provide you with the care you need, when you need it.  We recommend signing up for the patient portal called "MyChart".  Sign up information is provided on this After Visit Summary.  MyChart is used to connect with patients for Virtual Visits (Telemedicine).  Patients are able to view lab/test results, encounter notes, upcoming appointments, etc.  Non-urgent messages can be sent to your provider as well.   To learn more about what you can do with MyChart, go to [email or web address].    Your next appointment:   3 month(s)  Provider:   Palmer Bobo        Other  Instructions [facility]  [address] [place], [place]  [address] OPENING APRIL 28,2025       1st Floor: - Lobby - Registration  - Pharmacy  - Lab - Cafe   2nd Floor: - PV Lab - Diagnostic Testing (echo, CT, nuclear med)   3rd Floor: - Vacant   4th Floor: - TCTS (cardiothoracic surgery) - AFib Clinic - Structural Heart Clinic - Vascular Surgery  - Vascular Ultrasound   5th Floor: - HeartCare Cardiology (general and EP) - Clinical Pharmacy for coumadin, hypertension, lipid, weight-loss medications, and med management appointments      Valet parking services will be available as well.

## 2023-06-17 NOTE — Progress Notes (Signed)
 Cardiology Office Note:    Date:  06/17/2023  ID:  Christopher Lara, DOB 14-Aug-1975, MRN [id number] PCP: Abraham Abo, MD  [facility] Providers Cardiologist:  Peter Swaziland, MD { Click to update primary MD,subspecialty MD or APP then REFRESH:1}    {Click to Open Review  :1}   Patient Profile:      Chief Complaint: Post-Hospital follow-up after cardiac catheterization History of Present Illness:  Christopher Lara is a 48 y.o. male with visit-pertinent history of previous severe hyponatremia, COVID-pneumonia in 02/2019, large pericardial effusion in 03/2022 requiring pericardiocentesis then later pericardial window 05/2022, kidney stones, panhypopituitarism, hypothyroidism, hyperparathyroidism, hypercalcemia  He has history of severe hyponatremia in the setting of COVID-pneumonia in January 2021.  He was seen in the ED in December 2022 for syncopal episode that occurred after he took a hot shower.  He developed tunnel vision and sensation of things 1.before he passed out and hit his face in the fall.  He was admitted in February 2024 with severe hyponatremia after presenting with altered mental status.  He was found to have large pericardial effusion for which cardiology service was consulted.  Serial troponins were negative.  Sodium markedly low at 105.  CK significantly elevated to 15,000 consistent with rhabdomyolysis.  BNP was normal.  He was treated with hypertonic saline.  Echocardiogram at that time showed LVEF 70 to 75%, grade 2 diastolic dysfunction, large pericardial effusion measuring up to 3.85 cm with mild RV diastolic collapse and the respiratory flow variation across the tricuspid valve.  Findings were not consistent with cardiac tamponade.  Sodium had improved to 120.  Patient did undergo pericardiocentesis with removal of 1 L of fluid on 04/19/2022.  Initial analysis consistent with transudative effusion.  Limited echocardiogram obtained on 04/19/2022 demonstrated EF of 60-70%, trivial  pericardial effusion.  On follow-up limited echocardiogram obtained 05/23/2022 showed LVEF 60 to 65%, large pericardial effusion.  He was seen by Sherline Distel, NP on 06/05/2022 and was referred to CT surgery.  Unfortunately prior to him seeing CT surgery he was admitted to the hospital for 1124 chest pain.  Urgent bedside echocardiogram showed LVEF 60 to 65%, normal RV size and function, large pericardial effusion that is circumferential, no evidence of RV diastolic collapse.  ESR and CRP were elevated.  He was placed on colchicine , he initially had diarrhea and AKI, this improved with Imodium .  Patient was seen by Dr. Deloise Ferries of CT surgery and eventually underwent robotic assisted pericardial window on 06/11/2022.  Chest tube was eventually removed on 06/14/2022.  Subsequent chest x-ray was unremarkable.  It was recommended he continue colchicine  for total of 3 months.  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 He was treated with hydrocortisone . 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   He was recently admitted to the hospital for 06/04/2023.  He presented to the ED for persistent left arm pain.  He did not have any chest pain, shortness of breath, nausea, no vomiting.  His initial troponin while in the ED was 13,045 and repeat 11,941.  CK 503. EKG shows NSR 81bpm, with inferior Q waves and ST-TW changes inferiorly, with ST depression/TWI changes I, avL, V4-V6. CXR showed scattered patchy peripheral upper lung predominant opacities, which may represent pulmonary nodules.  He underwent stat limited echo to rule out recurrent effusion showing LVEF 70 to 70%, no wall motion abnormalities, normal RV, no pericardial effusion.  He then  underwent cardiac catheterization which showed occlusion of the mid to distal RCA treated with overlapping DES x 2.  There was residual 60% proximal LAD, 80% second diagonal, 50%  second OM to be treated medically.  Recommendations for DAPT with aspirin /Effient  for at least 1 year.  Unable to use statin therapy with chronically elevated CK.  Toprol -XL 25 mg was attempted however patient was unable to tolerate therefore Toprol -XL was discontinued at discharge   Discussed the use of AI scribe software for clinical note transcription with the patient, who gave verbal consent to proceed.  History of Present Illness The patient, a 48 year old with a history of recent cardiac catheterization, presents for follow-up.  Today patient notes he is doing well overall.  He is without any acute cardiovascular concerns or complaints.  He has been completely adherent to current medication regimen.  He denies any chest pains, exertional angina, left arm pain DOE.  He denies chest pain, shortness of breath, lower extremity edema, fatigue, palpitations, melena, hematuria, hemoptysis, diaphoresis, weakness, presyncope, syncope, orthopnea, and PND.  Patient works for the [employer].  The patient's job involves walking 4-5 miles a day.   Review of systems:  Please see the history of present illness. All other systems are reviewed and otherwise negative.     Home Medications:    Current Meds  Medication Sig   acetaminophen  (TYLENOL ) 650 MG CR tablet Take 650 mg by mouth every 8 (eight) hours as needed for pain.   aspirin  EC 81 MG tablet Take 1 tablet (81 mg total) by mouth daily. Swallow whole.   ergocalciferol (VITAMIN D2) 1.25 MG (50000 UT) capsule Take 50,000 Units by mouth once a week. On Sundays   hydrocortisone  (CORTEF ) 5 MG tablet Take 15 mg by mouth in the morning.   levothyroxine  (SYNTHROID ) 112 MCG tablet 1 tablet in the morning on an empty stomach Orally Once a day for 30 days STOP 100 MCG TABLETS   losartan  (COZAAR ) 25 MG tablet Take 0.5 tablets (12.5 mg total) by mouth daily.   nitroGLYCERIN  (NITROSTAT ) 0.4 MG SL tablet Place 1 tablet (0.4 mg total) under the tongue every 5  (five) minutes x 3 doses as needed for chest pain.   prasugrel  (EFFIENT ) 10 MG TABS tablet Take 1 tablet (10 mg total) by mouth daily.   Studies Reviewed:   EKG Interpretation Date/Time:  Monday June 17 2023 14:34:15 EDT Ventricular Rate:  75 PR Interval:  192 QRS Duration:  96 QT Interval:  378 QTC Calculation: 422 R Axis:   15  Text Interpretation: Normal sinus rhythm Inferior-posterior infarct (cited on or before 04-Jun-2023) Confirmed by Palmer Bobo [phone]) on 06/17/2023 4:05:53 PM    Echocardiogram 06/04/2023 1. Left ventricular ejection fraction, by estimation, is 70 to 75%. The  left ventricle has hyperdynamic function. The left ventricle has no  regional wall motion abnormalities. Left ventricular diastolic parameters  were normal.   2. Right ventricular systolic function is normal. The right ventricular  size is normal.   3. The mitral valve is normal in structure. No evidence of mitral valve  regurgitation. No evidence of mitral stenosis.   4. The aortic valve is tricuspid. Aortic valve regurgitation is not  visualized. No aortic stenosis is present.   5. The inferior vena cava is normal in size with <50% respiratory  variability, suggesting right atrial pressure of 8 mmHg.   Cardiac catheterization for 06/04/2023 Culprit vessel - occlusion of mid- distal RCA Moderate nonobstructive CAD in proximal LAD (RFR  0.93) and second OM. Small vessel disease in second diagonal Normal LVEDP Successful PCI of the mid to distal RCA with DES x 2 overlapping Diagnostic Dominance: Right  Intervention   Risk Assessment/Calculations:             Physical Exam:   VS:  BP (!) 94/56   Pulse 75   Ht 5\' 9"  (1.753 m)   Wt 167 lb (75.8 kg)   SpO2 100%   BMI 24.66 kg/m    Wt Readings from Last 3 Encounters:  06/17/23 167 lb (75.8 kg)  06/06/23 164 lb 3.2 oz (74.5 kg)  11/19/22 181 lb (82.1 kg)    GEN: Well nourished, well developed in no acute distress NECK: No JVD; No  carotid bruits CARDIAC: RRR, no murmurs, rubs, gallops RESPIRATORY:  Clear to auscultation without rales, wheezing or rhonchi  ABDOMEN: Soft, non-tender, non-distended EXTREMITIES:  No edema; No acute deformity     Assessment and Plan:  Coronary artery disease / NSTEMI Underwent cardiac catheterization on 06/04/2023 which showed occlusion of mid to distal RCA treated with overlapping DES x 2. Residual 60% proximal LAD, 80% second diagonal, 50% second OM to be treated medically.  - Today and since hospital discharge she has been without any cardiovascular concerns or complaints.  He has been without any for further ischemic evaluation at this time -EKG today without acute ischemic changes - Beta-blocking therapy limited due to low normal blood pressure - Continue DAPT x 1 year with aspirin  81 mg and Effient  10 mg daily - Losartan  25 mg daily and nitroglycerin  as needed - BMET and CBC today - Unable to utilize statin therapy with chronically elevated CK.  Referred to Pharm.D. lipid clinic for consideration of PCSK9 inhibitor  Panhypopituitarism Hypothyroidism  Hyperparathyroidism  Hypercalcemia  - On hydrocortisone  5mg  - takes 2 tablets in the morning and 1 between 4 pm and 6 pm by mouth twice daily - Continue levothyroxine  100mg  daily, vit D3 50,000 weekly on Sundays - Man[facility] at [facility]  Hyperlipidemia LDL 117, HDL 35, TG 125, TC 177 on 09/26/1912 Lipoprotein ( a) 72.3 on 06/05/2023 - As noted above unable to use statin therapy given chronically elevated CK.  Plan to refer to Pharm.D. lipid clinic for consideration of PCSK9 inhibitor  Pulmonary nodules Chest x-ray on admission showed scattered patchy peripheral uller [place] predominant opacities which may represent pulmonary nodules - CT chest ordered today for further imaging  H/o large pericardial effusion s/p pericardial window - Echocardiogram 06/04/2023 showed no evidence of pericardial effusion   {The patient has an  active order for outpatient cardiac rehabilitation.   Please indicate if the patient is ready to start. Do NOT delete this.  It will auto delete.  Refresh note, then sign.              Click here to document readiness and see contraindications.  :1}  Cardiac Rehabilitation Eligibility Assessment  The patient is ready to start cardiac rehabilitation from a cardiac standpoint.     Dispo:  Return in about 3 months (around 09/16/2023).  Signed, Ava Boatman, NP

## 2023-06-18 ENCOUNTER — Encounter: Payer: Self-pay | Admitting: Emergency Medicine

## 2023-06-18 LAB — CBC
Hematocrit: 38.9 % (ref 37.5–51.0)
Hemoglobin: 13.1 g/dL (ref 13.0–17.7)
MCH: 30.2 pg (ref 26.6–33.0)
MCHC: 33.7 g/dL (ref 31.5–35.7)
MCV: 90 fL (ref 79–97)
Platelets: 387 10*3/uL (ref 150–450)
RBC: 4.34 x10E6/uL (ref 4.14–5.80)
RDW: 11.3 % — ABNORMAL LOW (ref 11.6–15.4)
WBC: 5.4 10*3/uL (ref 3.4–10.8)

## 2023-06-18 LAB — BASIC METABOLIC PANEL WITH GFR
BUN/Creatinine Ratio: 15 (ref 9–20)
BUN: 12 mg/dL (ref 6–24)
CO2: 23 mmol/L (ref 20–29)
Calcium: 12.9 mg/dL — ABNORMAL HIGH (ref 8.7–10.2)
Chloride: 104 mmol/L (ref 96–106)
Creatinine, Ser: 0.8 mg/dL (ref 0.76–1.27)
Glucose: 77 mg/dL (ref 70–99)
Potassium: 4.3 mmol/L (ref 3.5–5.2)
Sodium: 138 mmol/L (ref 134–144)
eGFR: 108 mL/min/{1.73_m2} (ref >59)

## 2023-06-20 ENCOUNTER — Encounter (HOSPITAL_COMMUNITY)
Admission: RE | Admit: 2023-06-20 | Discharge: 2023-06-20 | Disposition: A | Discharge status: Home / Self Care | Source: Ambulatory Visit | Attending: Cardiology | Admitting: Cardiology

## 2023-06-20 VITALS — Ht 68.0 in | Wt 167.8 lb

## 2023-06-20 DIAGNOSIS — Z955 Presence of coronary angioplasty implant and graft: Secondary | ICD-10-CM | POA: Diagnosis not present

## 2023-06-20 DIAGNOSIS — I214 Non-ST elevation (NSTEMI) myocardial infarction: Secondary | ICD-10-CM | POA: Diagnosis not present

## 2023-06-20 NOTE — Progress Notes (Signed)
 Cardiac Individual Treatment Plan  Patient Details  Name: Christopher Lara MRN: [id number] Date of Birth: 07-28-1975 Referring Provider:   Flowsheet Row INTENSIVE CARDIAC REHAB ORIENT from 06/20/2023 in [facility]  Referring Provider Dr. [place] Peter       Initial Encounter Date:  Flowsheet Row INTENSIVE CARDIAC REHAB ORIENT from 06/20/2023 in [facility]  Date 06/20/23       Visit Diagnosis: 06/04/23 NSTEMI (non-ST elevated myocardial infarction) (HCC)  06/04/23 Status post coronary artery stent placement  Patient's Home Medications on Admission:  Current Outpatient Medications:    acetaminophen  (TYLENOL ) 650 MG CR tablet, Take 650 mg by mouth every 8 (eight) hours as needed for pain., Disp: , Rfl:    aspirin  EC 81 MG tablet, Take 1 tablet (81 mg total) by mouth daily. Swallow whole., Disp: 90 tablet, Rfl: 3   ergocalciferol (VITAMIN D2) 1.25 MG (50000 UT) capsule, Take 50,000 Units by mouth once a week. On Sundays, Disp: , Rfl:    hydrocortisone  (CORTEF ) 5 MG tablet, Take 15 mg by mouth in the morning., Disp: , Rfl:    levothyroxine  (SYNTHROID ) 112 MCG tablet, 1 tablet in the morning on an empty stomach Orally Once a day for 30 days STOP 100 MCG TABLETS, Disp: , Rfl:    losartan  (COZAAR ) 25 MG tablet, Take 0.5 tablets (12.5 mg total) by mouth daily., Disp: 15 tablet, Rfl: 11   nitroGLYCERIN  (NITROSTAT ) 0.4 MG SL tablet, Place 1 tablet (0.4 mg total) under the tongue every 5 (five) minutes x 3 doses as needed for chest pain., Disp: 25 tablet, Rfl: 3   prasugrel  (EFFIENT ) 10 MG TABS tablet, Take 1 tablet (10 mg total) by mouth daily., Disp: 90 tablet, Rfl: 3   levothyroxine  (SYNTHROID ) 100 MCG tablet, Take 100 mcg by mouth every morning. (Patient not taking: Reported on 06/17/2023), Disp: , Rfl:   Past Medical History: Past Medical History:  Diagnosis Date    Hypercalcemia    Hyperparathyroidism (HCC)    Hyponatremia    Hypothyroidism    Kidney stone    Panhypopituitarism (HCC)    Pericardial effusion    Pneumonia due to COVID-19 virus     Tobacco Use: Social History   Tobacco Use  Smoking Status Never   Passive exposure: Never  Smokeless Tobacco Never    Labs: Review Flowsheet       Latest Ref Rng & Units 06/05/2023  Labs for ITP Cardiac and Pulmonary Rehab  Cholestrol 0 - 200 mg/dL 409   LDL (calc) 0 - 99 mg/dL 811   HDL-C >91 mg/dL 35   Trlycerides <478 mg/dL 295   Hemoglobin A2Z 4.8 - 5.6 % 4.8     Capillary Blood Glucose: Lab Results  Component Value Date   GLUCAP 80 02/22/2021   GLUCAP 58 (L) 02/22/2021   GLUCAP 74 03/18/2019   GLUCAP 65 (L) 03/18/2019   GLUCAP 90 03/15/2019     Exercise Target Goals: Exercise Program Goal: Individual exercise prescription set using results from initial 6 min walk test and THRR while considering  patient's activity barriers and safety.   Exercise Prescription Goal: Initial exercise prescription builds to 30-45 minutes a day of aerobic activity, 2-3 days per week.  Home exercise guidelines will be given to patient during program as part of exercise prescription that the participant will acknowledge.  Activity Barriers & Risk Stratification:  Activity Barriers &  Cardiac Risk Stratification - 06/20/23 1101       Activity Barriers & Cardiac Risk Stratification   Activity Barriers None    Cardiac Risk Stratification Moderate             6 Minute Walk:  6 Minute Walk     Row Name 06/20/23 1320         6 Minute Walk   Phase Initial     Distance 1440 feet     Walk Time 6 minutes     # of Rest Breaks 0     MPH 2.72     METS 4.5     RPE 8     Perceived Dyspnea  0     VO2 Peak 15.83     Symptoms No     Resting HR 70 bpm     Resting BP 112/68     Resting Oxygen Saturation  100 %     Max Ex. HR 118 bpm     Max Ex. BP 122/70     2 Minute Post BP 120/76               Oxygen Initial Assessment:   Oxygen Re-Evaluation:   Oxygen Discharge (Final Oxygen Re-Evaluation):   Initial Exercise Prescription:  Initial Exercise Prescription - 06/20/23 1300       Date of Initial Exercise RX and Referring Provider   Date 06/20/23    Referring Provider Dr. [place] Peter    Expected Discharge Date 09/11/23      Bike   Level 2    Watts 26    Minutes 15    METs 2.4      Elliptical   Level 1    Speed 1    Minutes 15    METs 3.4      Prescription Details   Frequency (times per week) 3 days/week    Duration Progress to 30 minutes of continuous aerobic without signs/symptoms of physical distress      Intensity   THRR 40-80% of Max Heartrate 68-137    Ratings of Perceived Exertion 11-13      Progression   Progression Continue to progress workloads to maintain intensity without signs/symptoms of physical distress.      Resistance Training   Training Prescription Yes    Weight 4    Reps 10-15             Perform Capillary Blood Glucose checks as needed.  Exercise Prescription Changes:   Exercise Comments:   Exercise Comments     Row Name 06/20/23 1102           Exercise Comments Pt orientation. Pt maintain SR during . Pt asymptomatic.                Exercise Goals and Review:   Exercise Goals     Row Name 06/20/23 1101             Exercise Goals   Increase Physical Activity Yes       Intervention Provide advice, education, support and counseling about physical activity/exercise needs.;Develop an individualized exercise prescription for aerobic and resistive training based on initial evaluation findings, risk stratification, comorbidities and participant's personal goals.       Expected Outcomes Short Term: Attend rehab on a regular basis to increase amount of physical activity.;Long Term: Exercising regularly at least 3-5 days a week.;Long Term: Add in home exercise to make exercise part of routine and to  increase  amount of physical activity.       Increase Strength and Stamina Yes       Intervention Provide advice, education, support and counseling about physical activity/exercise needs.;Develop an individualized exercise prescription for aerobic and resistive training based on initial evaluation findings, risk stratification, comorbidities and participant's personal goals.       Expected Outcomes Short Term: Increase workloads from initial exercise prescription for resistance, speed, and METs.;Short Term: Perform resistance training exercises routinely during rehab and add in resistance training at home;Long Term: Improve cardiorespiratory fitness, muscular endurance and strength as measured by increased METs and functional capacity ( )       Able to understand and use rate of perceived exertion (RPE) scale Yes       Intervention Provide education and explanation on how to use RPE scale       Expected Outcomes Short Term: Able to use RPE daily in rehab to express subjective intensity level;Long Term:  Able to use RPE to guide intensity level when exercising independently       Knowledge and understanding of Target Heart Rate Range (THRR) Yes       Intervention Provide education and explanation of THRR including how the numbers were predicted and where they are located for reference       Expected Outcomes Short Term: Able to state/look up THRR;Long Term: Able to use THRR to govern intensity when exercising independently;Short Term: Able to use daily as guideline for intensity in rehab       Understanding of Exercise Prescription Yes       Intervention Provide education, explanation, and written materials on patient's individual exercise prescription       Expected Outcomes Short Term: Able to explain program exercise prescription;Long Term: Able to explain home exercise prescription to exercise independently                Exercise Goals Re-Evaluation :   Discharge Exercise Prescription  (Final Exercise Prescription Changes):   Nutrition:  Target Goals: Understanding of nutrition guidelines, daily intake of sodium 1500mg , cholesterol 200mg , calories 30% from fat and 7% or less from saturated fats, daily to have 5 or more servings of fruits and vegetables.  Biometrics:  Pre Biometrics - 06/20/23 1100       Pre Biometrics   Waist Circumference 37 inches    Hip Circumference 39.5 inches    Waist to Hip Ratio 0.94 %    Triceps Skinfold 10 mm    % Body Fat 23.1 %    Grip Strength 32 kg    Flexibility 12.5 in    Single Leg Stand 15.68 seconds              Nutrition Therapy Plan and Nutrition Goals:   Nutrition Assessments:  MEDIFICTS Score Key: >=70 Need to make dietary changes  40-70 Heart Healthy Diet <= 40 Therapeutic Level Cholesterol Diet    Picture Your Plate Scores: <29 Unhealthy dietary pattern with much room for improvement. 41-50 Dietary pattern unlikely to meet recommendations for good health and room for improvement. 51-60 More healthful dietary pattern, with some room for improvement.  >60 Healthy dietary pattern, although there may be some specific behaviors that could be improved.    Nutrition Goals Re-Evaluation:   Nutrition Goals Re-Evaluation:   Nutrition Goals Discharge (Final Nutrition Goals Re-Evaluation):   Psychosocial: Target Goals: Acknowledge presence or absence of significant depression and/or stress, maximize coping skills, provide positive support system. Participant is able to verbalize types and  ability to use techniques and skills needed for reducing stress and depression.  Initial Review & Psychosocial Screening:   Quality of Life Scores:  Quality of Life - 06/20/23 1342       Quality of Life   Select Quality of Life      Quality of Life Scores   Health/Function Pre 23.43 %    Socioeconomic Pre 26.43 %    Psych/Spiritual Pre 24.86 %    Family Pre 27 %    GLOBAL Pre 24.8 %            Scores of  19 and below usually indicate a poorer quality of life in these areas.  A difference of  2-3 points is a clinically meaningful difference.  A difference of 2-3 points in the total score of the Quality of Life Index has been associated with significant improvement in overall quality of life, self-image, physical symptoms, and general health in studies assessing change in quality of life.  PHQ-9: Review Flowsheet       05/16/2022 04/03/2021 03/26/2019  Depression screen PHQ 2/9  Decreased Interest 0 0 0  Down, Depressed, Hopeless 0 0 0  PHQ - 2 Score 0 0 0  Altered sleeping - 0 -  Tired, decreased energy - 0 -  Change in appetite - 0 -  Feeling bad or failure about yourself  - 0 -  Trouble concentrating - 0 -  Moving slowly or fidgety/restless - 0 -  Suicidal thoughts - 0 -  PHQ-9 Score - 0 -   Interpretation of Total Score  Total Score Depression Severity:  1-4 = Minimal depression, 5-9 = Mild depression, 10-14 = Moderate depression, 15-19 = Moderately severe depression, 20-27 = Severe depression   Psychosocial Evaluation and Intervention:   Psychosocial Re-Evaluation:   Psychosocial Discharge (Final Psychosocial Re-Evaluation):   Vocational Rehabilitation: Provide vocational rehab assistance to qualifying candidates.   Vocational Rehab Evaluation & Intervention:  Vocational Rehab - 06/20/23 1113       Initial Vocational Rehab Evaluation & Intervention   Assessment shows need for Vocational Rehabilitation No   Currently working            Education: Education Goals: Education classes will be provided on a weekly basis, covering required topics. Participant will state understanding/return demonstration of topics presented.     Core Videos: Exercise    Move It!  Clinical staff conducted group or individual video education with verbal and written material and guidebook.  Patient learns the recommended Pritikin exercise program. Exercise with the goal of living a  long, healthy life. Some of the health benefits of exercise include controlled diabetes, healthier blood pressure levels, improved cholesterol levels, improved heart and lung capacity, improved sleep, and better body composition. Everyone should speak with their doctor before starting or changing an exercise routine.  Biomechanical Limitations Clinical staff conducted group or individual video education with verbal and written material and guidebook.  Patient learns how biomechanical limitations can impact exercise and how we can mitigate and possibly overcome limitations to have an impactful and balanced exercise routine.  Body Composition Clinical staff conducted group or individual video education with verbal and written material and guidebook.  Patient learns that body composition (ratio of muscle mass to fat mass) is a key component to assessing overall fitness, rather than body weight alone. Increased fat mass, especially visceral belly fat, can put us  at increased risk for metabolic syndrome, type 2 diabetes, heart disease, and even death. It is  recommended to combine diet and exercise (cardiovascular and resistance training) to improve your body composition. Seek guidance from your physician and exercise physiologist before implementing an exercise routine.  Exercise Action Plan Clinical staff conducted group or individual video education with verbal and written material and guidebook.  Patient learns the recommended strategies to achieve and enjoy long-term exercise adherence, including variety, self-motivation, self-efficacy, and positive decision making. Benefits of exercise include fitness, good health, weight management, more energy, better sleep, less stress, and overall well-being.  Medical   Heart Disease Risk Reduction Clinical staff conducted group or individual video education with verbal and written material and guidebook.  Patient learns our heart is our most vital organ as it  circulates oxygen, nutrients, white blood cells, and hormones throughout the entire body, and carries waste away. Data supports a plant-based eating plan like the Pritikin Program for its effectiveness in slowing progression of and reversing heart disease. The video provides a number of recommendations to address heart disease.   Metabolic Syndrome and Belly Fat  Clinical staff conducted group or individual video education with verbal and written material and guidebook.  Patient learns what metabolic syndrome is, how it leads to heart disease, and how one can reverse it and keep it from coming back. You have metabolic syndrome if you have 3 of the following 5 criteria: abdominal obesity, high blood pressure, high triglycerides, low HDL cholesterol, and high blood sugar.  Hypertension and Heart Disease Clinical staff conducted group or individual video education with verbal and written material and guidebook.  Patient learns that high blood pressure, or hypertension, is very common in the [place] . Hypertension is largely due to excessive salt intake, but other important risk factors include being overweight, physical inactivity, drinking too much alcohol, smoking, and not eating enough potassium from fruits and vegetables. High blood pressure is a leading risk factor for heart attack, stroke, congestive heart failure, dementia, kidney failure, and premature death. Long-term effects of excessive salt intake include stiffening of the arteries and thickening of heart muscle and organ damage. Recommendations include ways to reduce hypertension and the risk of heart disease.  Diseases of Our Time - Focusing on Diabetes Clinical staff conducted group or individual video education with verbal and written material and guidebook.  Patient learns why the best way to stop diseases of our time is prevention, through food and other lifestyle changes. Medicine (such as prescription pills and surgeries) is often  only a Band-Aid on the problem, not a long-term solution. Most common diseases of our time include obesity, type 2 diabetes, hypertension, heart disease, and cancer. The Pritikin Program is recommended and has been proven to help reduce, reverse, and/or prevent the damaging effects of metabolic syndrome.  Nutrition   Overview of the Pritikin Eating Plan  Clinical staff conducted group or individual video education with verbal and written material and guidebook.  Patient learns about the Pritikin Eating Plan for disease risk reduction. The Pritikin Eating Plan emphasizes a wide variety of unrefined, minimally-processed carbohydrates, like fruits, vegetables, whole grains, and legumes. Go, Caution, and Stop food choices are explained. Plant-based and lean animal proteins are emphasized. Rationale provided for low sodium intake for blood pressure control, low added sugars for blood sugar stabilization, and low added fats and oils for coronary artery disease risk reduction and weight management.  Calorie Density  Clinical staff conducted group or individual video education with verbal and written material and guidebook.  Patient learns about calorie density and how it impacts  the Pritikin Eating Plan. Knowing the characteristics of the food you choose will help you decide whether those foods will lead to weight gain or weight loss, and whether you want to consume more or less of them. Weight loss is usually a side effect of the Pritikin Eating Plan because of its focus on low calorie-dense foods.  Label Reading  Clinical staff conducted group or individual video education with verbal and written material and guidebook.  Patient learns about the Pritikin recommended label reading guidelines and corresponding recommendations regarding calorie density, added sugars, sodium content, and whole grains.  Dining Out - Part 1  Clinical staff conducted group or individual video education with verbal and written  material and guidebook.  Patient learns that restaurant meals can be sabotaging because they can be so high in calories, fat, sodium, and/or sugar. Patient learns recommended strategies on how to positively address this and avoid unhealthy pitfalls.  Facts on Fats  Clinical staff conducted group or individual video education with verbal and written material and guidebook.  Patient learns that lifestyle modifications can be just as effective, if not more so, as many medications for lowering your risk of heart disease. A Pritikin lifestyle can help to reduce your risk of inflammation and atherosclerosis (cholesterol build-up, or plaque, in the artery walls). Lifestyle interventions such as dietary choices and physical activity address the cause of atherosclerosis. A review of the types of fats and their impact on blood cholesterol levels, along with dietary recommendations to reduce fat intake is also included.  Nutrition Action Plan  Clinical staff conducted group or individual video education with verbal and written material and guidebook.  Patient learns how to incorporate Pritikin recommendations into their lifestyle. Recommendations include planning and keeping personal health goals in mind as an important part of their success.  Healthy Mind-Set    Healthy Minds, Bodies, Hearts  Clinical staff conducted group or individual video education with verbal and written material and guidebook.  Patient learns how to identify when they are stressed. Video will discuss the impact of that stress, as well as the many benefits of stress management. Patient will also be introduced to stress management techniques. The way we think, act, and feel has an impact on our hearts.  How Our Thoughts Can Heal Our Hearts  Clinical staff conducted group or individual video education with verbal and written material and guidebook.  Patient learns that negative thoughts can cause depression and anxiety. This can result in  negative lifestyle behavior and serious health problems. Cognitive behavioral therapy is an effective method to help control our thoughts in order to change and improve our emotional outlook.  Additional Videos:  Exercise    Improving Performance  Clinical staff conducted group or individual video education with verbal and written material and guidebook.  Patient learns to use a non-linear approach by alternating intensity levels and lengths of time spent exercising to help burn more calories and lose more body fat. Cardiovascular exercise helps improve heart health, metabolism, hormonal balance, blood sugar control, and recovery from fatigue. Resistance training improves strength, endurance, balance, coordination, reaction time, metabolism, and muscle mass. Flexibility exercise improves circulation, posture, and balance. Seek guidance from your physician and exercise physiologist before implementing an exercise routine and learn your capabilities and proper form for all exercise.  Introduction to Yoga  Clinical staff conducted group or individual video education with verbal and written material and guidebook.  Patient learns about yoga, a discipline of the coming together of mind,  breath, and body. The benefits of yoga include improved flexibility, improved range of motion, better posture and core strength, increased lung function, weight loss, and positive self-image. Yoga's heart health benefits include lowered blood pressure, healthier heart rate, decreased cholesterol and triglyceride levels, improved immune function, and reduced stress. Seek guidance from your physician and exercise physiologist before implementing an exercise routine and learn your capabilities and proper form for all exercise.  Medical   Aging: Enhancing Your Quality of Life  Clinical staff conducted group or individual video education with verbal and written material and guidebook.  Patient learns key strategies and  recommendations to stay in good physical health and enhance quality of life, such as prevention strategies, having an advocate, securing a Health Care Proxy and Power of Attorney, and keeping a list of medications and system for tracking them. It also discusses how to avoid risk for bone loss.  Biology of Weight Control  Clinical staff conducted group or individual video education with verbal and written material and guidebook.  Patient learns that weight gain occurs because we consume more calories than we burn (eating more, moving less). Even if your body weight is normal, you may have higher ratios of fat compared to muscle mass. Too much body fat puts you at increased risk for cardiovascular disease, heart attack, stroke, type 2 diabetes, and obesity-related cancers. In addition to exercise, following the Pritikin Eating Plan can help reduce your risk.  Decoding Lab Results  Clinical staff conducted group or individual video education with verbal and written material and guidebook.  Patient learns that lab test reflects one measurement whose values change over time and are influenced by many factors, including medication, stress, sleep, exercise, food, hydration, pre-existing medical conditions, and more. It is recommended to use the knowledge from this video to become more involved with your lab results and evaluate your numbers to speak with your doctor.   Diseases of Our Time - Overview  Clinical staff conducted group or individual video education with verbal and written material and guidebook.  Patient learns that according to the CDC, 50% to 70% of chronic diseases (such as obesity, type 2 diabetes, elevated lipids, hypertension, and heart disease) are avoidable through lifestyle improvements including healthier food choices, listening to satiety cues, and increased physical activity.  Sleep Disorders Clinical staff conducted group or individual video education with verbal and written  material and guidebook.  Patient learns how good quality and duration of sleep are important to overall health and well-being. Patient also learns about sleep disorders and how they impact health along with recommendations to address them, including discussing with a physician.  Nutrition  Dining Out - Part 2 Clinical staff conducted group or individual video education with verbal and written material and guidebook.  Patient learns how to plan ahead and communicate in order to maximize their dining experience in a healthy and nutritious manner. Included are recommended food choices based on the type of restaurant the patient is visiting.   Fueling a Banker conducted group or individual video education with verbal and written material and guidebook.  There is a strong connection between our food choices and our health. Diseases like obesity and type 2 diabetes are very prevalent and are in large-part due to lifestyle choices. The Pritikin Eating Plan provides plenty of food and hunger-curbing satisfaction. It is easy to follow, affordable, and helps reduce health risks.  Menu Workshop  Clinical staff conducted group or individual video education with verbal  and written material and guidebook.  Patient learns that restaurant meals can sabotage health goals because they are often packed with calories, fat, sodium, and sugar. Recommendations include strategies to plan ahead and to communicate with the manager, chef, or server to help order a healthier meal.  Planning Your Eating Strategy  Clinical staff conducted group or individual video education with verbal and written material and guidebook.  Patient learns about the Pritikin Eating Plan and its benefit of reducing the risk of disease. The Pritikin Eating Plan does not focus on calories. Instead, it emphasizes high-quality, nutrient-rich foods. By knowing the characteristics of the foods, we choose, we can determine their  calorie density and make informed decisions.  Targeting Your Nutrition Priorities  Clinical staff conducted group or individual video education with verbal and written material and guidebook.  Patient learns that lifestyle habits have a tremendous impact on disease risk and progression. This video provides eating and physical activity recommendations based on your personal health goals, such as reducing LDL cholesterol, losing weight, preventing or controlling type 2 diabetes, and reducing high blood pressure.  Vitamins and Minerals  Clinical staff conducted group or individual video education with verbal and written material and guidebook.  Patient learns different ways to obtain key vitamins and minerals, including through a recommended healthy diet. It is important to discuss all supplements you take with your doctor.   Healthy Mind-Set    Smoking Cessation  Clinical staff conducted group or individual video education with verbal and written material and guidebook.  Patient learns that cigarette smoking and tobacco addiction pose a serious health risk which affects millions of people. Stopping smoking will significantly reduce the risk of heart disease, lung disease, and many forms of cancer. Recommended strategies for quitting are covered, including working with your doctor to develop a successful plan.  Culinary   Becoming a Set designer conducted group or individual video education with verbal and written material and guidebook.  Patient learns that cooking at home can be healthy, cost-effective, quick, and puts them in control. Keys to cooking healthy recipes will include looking at your recipe, assessing your equipment needs, planning ahead, making it simple, choosing cost-effective seasonal ingredients, and limiting the use of added fats, salts, and sugars.  Cooking - Breakfast and Snacks  Clinical staff conducted group or individual video education with verbal and  written material and guidebook.  Patient learns how important breakfast is to satiety and nutrition through the entire day. Recommendations include key foods to eat during breakfast to help stabilize blood sugar levels and to prevent overeating at meals later in the day. Planning ahead is also a key component.  Cooking - Educational psychologist conducted group or individual video education with verbal and written material and guidebook.  Patient learns eating strategies to improve overall health, including an approach to cook more at home. Recommendations include thinking of animal protein as a side on your plate rather than center stage and focusing instead on lower calorie dense options like vegetables, fruits, whole grains, and plant-based proteins, such as beans. Making sauces in large quantities to freeze for later and leaving the skin on your vegetables are also recommended to maximize your experience.  Cooking - Healthy Salads and Dressing Clinical staff conducted group or individual video education with verbal and written material and guidebook.  Patient learns that vegetables, fruits, whole grains, and legumes are the foundations of the Pritikin Eating Plan. Recommendations include how to incorporate  each of these in flavorful and healthy salads, and how to create homemade salad dressings. Proper handling of ingredients is also covered. Cooking - Soups and [employer] - Soups and Desserts Clinical staff conducted group or individual video education with verbal and written material and guidebook.  Patient learns that Pritikin soups and desserts make for easy, nutritious, and delicious snacks and meal components that are low in sodium, fat, sugar, and calorie density, while high in vitamins, minerals, and filling fiber. Recommendations include simple and healthy ideas for soups and desserts.   Overview     The Pritikin Solution Program Overview Clinical staff conducted group or  individual video education with verbal and written material and guidebook.  Patient learns that the results of the Pritikin Program have been documented in more than 100 articles published in peer-reviewed journals, and the benefits include reducing risk factors for (and, in some cases, even reversing) high cholesterol, high blood pressure, type 2 diabetes, obesity, and more! An overview of the three key pillars of the Pritikin Program will be covered: eating well, doing regular exercise, and having a healthy mind-set.  WORKSHOPS  Exercise: Exercise Basics: Building Your Action Plan Clinical staff led group instruction and group discussion with PowerPoint presentation and patient guidebook. To enhance the learning environment the use of posters, models and videos may be added. At the conclusion of this workshop, patients will comprehend the difference between physical activity and exercise, as well as the benefits of incorporating both, into their routine. Patients will understand the FITT (Frequency, Intensity, Time, and Type) principle and how to use it to build an exercise action plan. In addition, safety concerns and other considerations for exercise and cardiac rehab will be addressed by the presenter. The purpose of this lesson is to promote a comprehensive and effective weekly exercise routine in order to improve patients' overall level of fitness.   Managing Heart Disease: Your Path to a Healthier Heart Clinical staff led group instruction and group discussion with PowerPoint presentation and patient guidebook. To enhance the learning environment the use of posters, models and videos may be added.At the conclusion of this workshop, patients will understand the anatomy and physiology of the heart. Additionally, they will understand how Pritikin's three pillars impact the risk factors, the progression, and the management of heart disease.  The purpose of this lesson is to provide a high-level  overview of the heart, heart disease, and how the Pritikin lifestyle positively impacts risk factors.  Exercise Biomechanics Clinical staff led group instruction and group discussion with PowerPoint presentation and patient guidebook. To enhance the learning environment the use of posters, models and videos may be added. Patients will learn how the structural parts of their bodies function and how these functions impact their daily activities, movement, and exercise. Patients will learn how to promote a neutral spine, learn how to manage pain, and identify ways to improve their physical movement in order to promote healthy living. The purpose of this lesson is to expose patients to common physical limitations that impact physical activity. Participants will learn practical ways to adapt and manage aches and pains, and to minimize their effect on regular exercise. Patients will learn how to maintain good posture while sitting, walking, and lifting.  Balance Training and Fall Prevention  Clinical staff led group instruction and group discussion with PowerPoint presentation and patient guidebook. To enhance the learning environment the use of posters, models and videos may be added. At the conclusion of this workshop, patients  will understand the importance of their sensorimotor skills (vision, proprioception, and the vestibular system) in maintaining their ability to balance as they age. Patients will apply a variety of balancing exercises that are appropriate for their current level of function. Patients will understand the common causes for poor balance, possible solutions to these problems, and ways to modify their physical environment in order to minimize their fall risk. The purpose of this lesson is to teach patients about the importance of maintaining balance as they age and ways to minimize their risk of falling.  WORKSHOPS   Nutrition:  Fueling a Ship broker led group  instruction and group discussion with PowerPoint presentation and patient guidebook. To enhance the learning environment the use of posters, models and videos may be added. Patients will review the foundational principles of the Pritikin Eating Plan and understand what constitutes a serving size in each of the food groups. Patients will also learn Pritikin-friendly foods that are better choices when away from home and review make-ahead meal and snack options. Calorie density will be reviewed and applied to three nutrition priorities: weight maintenance, weight loss, and weight gain. The purpose of this lesson is to reinforce (in a group setting) the key concepts around what patients are recommended to eat and how to apply these guidelines when away from home by planning and selecting Pritikin-friendly options. Patients will understand how calorie density may be adjusted for different weight management goals.  Mindful Eating  Clinical staff led group instruction and group discussion with PowerPoint presentation and patient guidebook. To enhance the learning environment the use of posters, models and videos may be added. Patients will briefly review the concepts of the Pritikin Eating Plan and the importance of low-calorie dense foods. The concept of mindful eating will be introduced as well as the importance of paying attention to internal hunger signals. Triggers for non-hunger eating and techniques for dealing with triggers will be explored. The purpose of this lesson is to provide patients with the opportunity to review the basic principles of the Pritikin Eating Plan, discuss the value of eating mindfully and how to measure internal cues of hunger and fullness using the Hunger Scale. Patients will also discuss reasons for non-hunger eating and learn strategies to use for controlling emotional eating.  Targeting Your Nutrition Priorities Clinical staff led group instruction and group discussion with  PowerPoint presentation and patient guidebook. To enhance the learning environment the use of posters, models and videos may be added. Patients will learn how to determine their genetic susceptibility to disease by reviewing their family history. Patients will gain insight into the importance of diet as part of an overall healthy lifestyle in mitigating the impact of genetics and other environmental insults. The purpose of this lesson is to provide patients with the opportunity to assess their personal nutrition priorities by looking at their family history, their own health history and current risk factors. Patients will also be able to discuss ways of prioritizing and modifying the Pritikin Eating Plan for their highest risk areas  Menu  Clinical staff led group instruction and group discussion with PowerPoint presentation and patient guidebook. To enhance the learning environment the use of posters, models and videos may be added. Using menus brought in from [employer], or printed from [employer], patients will apply the Pritikin dining out guidelines that were presented in the [employer] video. Patients will also be able to practice these guidelines in a variety of provided scenarios. The  purpose of this lesson is to provide patients with the opportunity to practice hands-on learning of the Pritikin Dining Out guidelines with actual menus and practice scenarios.  Label Reading Clinical staff led group instruction and group discussion with PowerPoint presentation and patient guidebook. To enhance the learning environment the use of posters, models and videos may be added. Patients will review and discuss the Pritikin label reading guidelines presented in Pritikin's Label Reading Educational series video. Using fool labels brought in from local grocery stores and markets, patients will apply the label reading guidelines and determine if the packaged food meet the Pritikin  guidelines. The purpose of this lesson is to provide patients with the opportunity to review, discuss, and practice hands-on learning of the Pritikin Label Reading guidelines with actual packaged food labels. Cooking School  Pritikin's [employer] are designed to teach patients ways to prepare quick, simple, and affordable recipes at home. The importance of nutrition's role in chronic disease risk reduction is reflected in its emphasis in the overall Pritikin program. By learning how to prepare essential core Pritikin Eating Plan recipes, patients will increase control over what they eat; be able to customize the flavor of foods without the use of added salt, sugar, or fat; and improve the quality of the food they consume. By learning a set of core recipes which are easily assembled, quickly prepared, and affordable, patients are more likely to prepare more healthy foods at home. These workshops focus on convenient breakfasts, simple entres, side dishes, and desserts which can be prepared with minimal effort and are consistent with nutrition recommendations for cardiovascular risk reduction. Cooking [employer] are taught by a Armed forces logistics/support/administrative officer (RD) who has been trained by the [employer]. The chef or RD has a clear understanding of the importance of minimizing - if not completely eliminating - added fat, sugar, and sodium in recipes. Throughout the series of Cooking School Workshop sessions, patients will learn about healthy ingredients and efficient methods of cooking to build confidence in their capability to prepare    Cooking School weekly topics:  Adding Flavor- Sodium-Free  Fast and Healthy Breakfasts  Powerhouse Plant-Based Proteins  Satisfying Salads and Dressings  Simple Sides and Sauces  International Cuisine-Spotlight on the [employer] Zones  Delicious Desserts  Savory Soups  [employer] - Meals in a Astronomer Appetizers and  Snacks  Comforting Weekend Breakfasts  One-Pot Wonders   Fast Evening Meals  Landscape architect Your Pritikin Plate  WORKSHOPS   Healthy Mindset (Psychosocial):  Focused Goals, Sustainable Changes Clinical staff led group instruction and group discussion with PowerPoint presentation and patient guidebook. To enhance the learning environment the use of posters, models and videos may be added. Patients will be able to apply effective goal setting strategies to establish at least one personal goal, and then take consistent, meaningful action toward that goal. They will learn to identify common barriers to achieving personal goals and develop strategies to overcome them. Patients will also gain an understanding of how our mind-set can impact our ability to achieve goals and the importance of cultivating a positive and growth-oriented mind-set. The purpose of this lesson is to provide patients with a deeper understanding of how to set and achieve personal goals, as well as the tools and strategies needed to overcome common obstacles which may arise along the way.  From Head to Heart: The Power of a Healthy Outlook  Clinical staff led group instruction and group discussion  with PowerPoint presentation and patient guidebook. To enhance the learning environment the use of posters, models and videos may be added. Patients will be able to recognize and describe the impact of emotions and mood on physical health. They will discover the importance of self-care and explore self-care practices which may work for them. Patients will also learn how to utilize the 4 C's to cultivate a healthier outlook and better manage stress and challenges. The purpose of this lesson is to demonstrate to patients how a healthy outlook is an essential part of maintaining good health, especially as they continue their cardiac rehab journey.  Healthy Sleep for a Healthy Heart Clinical staff led group instruction and  group discussion with PowerPoint presentation and patient guidebook. To enhance the learning environment the use of posters, models and videos may be added. At the conclusion of this workshop, patients will be able to demonstrate knowledge of the importance of sleep to overall health, well-being, and quality of life. They will understand the symptoms of, and treatments for, common sleep disorders. Patients will also be able to identify daytime and nighttime behaviors which impact sleep, and they will be able to apply these tools to help manage sleep-related challenges. The purpose of this lesson is to provide patients with a general overview of sleep and outline the importance of quality sleep. Patients will learn about a few of the most common sleep disorders. Patients will also be introduced to the concept of "sleep hygiene," and discover ways to self-manage certain sleeping problems through simple daily behavior changes. Finally, the workshop will motivate patients by clarifying the links between quality sleep and their goals of heart-healthy living.   Recognizing and Reducing Stress Clinical staff led group instruction and group discussion with PowerPoint presentation and patient guidebook. To enhance the learning environment the use of posters, models and videos may be added. At the conclusion of this workshop, patients will be able to understand the types of stress reactions, differentiate between acute and chronic stress, and recognize the impact that chronic stress has on their health. They will also be able to apply different coping mechanisms, such as reframing negative self-talk. Patients will have the opportunity to practice a variety of stress management techniques, such as deep abdominal breathing, progressive muscle relaxation, and/or guided imagery.  The purpose of this lesson is to educate patients on the role of stress in their lives and to provide healthy techniques for coping with  it.  Learning Barriers/Preferences:  Learning Barriers/Preferences - 06/20/23 1112       Learning Barriers/Preferences   Learning Barriers None    Learning Preferences Group Instruction;Individual Instruction;Skilled Demonstration;Verbal Instruction             Education Topics:  Knowledge Questionnaire Score:  Knowledge Questionnaire Score - 06/20/23 1112       Knowledge Questionnaire Score   Pre Score 20/24             Core Components/Risk Factors/Patient Goals at Admission:  Personal Goals and Risk Factors at Admission - 06/20/23 1342       Core Components/Risk Factors/Patient Goals on Admission    Weight Management Yes;Weight Loss    Intervention Weight Management: Develop a combined nutrition and exercise program designed to reach desired caloric intake, while maintaining appropriate intake of nutrient and fiber, sodium and fats, and appropriate energy expenditure required for the weight goal.;Weight Management: Provide education and appropriate resources to help participant work on and attain dietary goals.    Expected Outcomes Short  Term: Continue to assess and modify interventions until short term weight is achieved;Long Term: Adherence to nutrition and physical activity/exercise program aimed toward attainment of established weight goal;Weight Maintenance: Understanding of the daily nutrition guidelines, which includes 25-35% calories from fat, 7% or less cal from saturated fats, less than 200mg  cholesterol, less than 1.5gm of sodium, & 5 or more servings of fruits and vegetables daily;Weight Loss: Understanding of general recommendations for a balanced deficit meal plan, which promotes 1-2 lb weight loss per week and includes a negative energy balance of [phone] kcal/d;Understanding recommendations for meals to include 15-35% energy as protein, 25-35% energy from fat, 35-60% energy from carbohydrates, less than 200mg  of dietary cholesterol, 20-35 gm of total fiber  daily;Understanding of distribution of calorie intake throughout the day with the consumption of 4-5 meals/snacks    Hypertension Yes    Intervention Provide education on lifestyle modifcations including regular physical activity/exercise, weight management, moderate sodium restriction and increased consumption of fresh fruit, vegetables, and low fat dairy, alcohol moderation, and smoking cessation.;Monitor prescription use compliance.    Expected Outcomes Long Term: Maintenance of blood pressure at goal levels.;Short Term: Continued assessment and intervention until BP is < 140/63mm HG in hypertensive participants. < 130/37mm HG in hypertensive participants with diabetes, heart failure or chronic kidney disease.    Lipids Yes    Intervention Provide education and support for participant on nutrition & aerobic/resistive exercise along with prescribed medications to achieve LDL 70mg , HDL >40mg .    Expected Outcomes Long Term: Cholesterol controlled with medications as prescribed, with individualized exercise RX and with personalized nutrition plan. Value goals: LDL < 70mg , HDL > 40 mg.;Short Term: Participant states understanding of desired cholesterol values and is compliant with medications prescribed. Participant is following exercise prescription and nutrition guidelines.             Core Components/Risk Factors/Patient Goals Review:    Core Components/Risk Factors/Patient Goals at Discharge (Final Review):    ITP Comments:  ITP Comments     Row Name 06/20/23 1059           ITP Comments Dr. Gaylyn Keas medical director. Introduction to pritikin education/intensive cardiac rehab. Initial orientation packet reviewed with patient.                Comments:  Pt completed CR orientation on 06/20/23. No needs identified during interview. Pt maintained SR during , asymptomatic during walk test and after. Pt introduced to Pritikin via video and packet. Packet reviewed with pt  prior to leaving. Pt plans to return on Monday for first full day.   Barkley Li MS, ACSM-CEP 06/20/2023 2:07 PM  [phone]

## 2023-06-20 NOTE — Progress Notes (Signed)
 Cardiac Rehab Medication Review   Does the patient  feel that his/her medications are working for him/her?   Yes  Has the patient been experiencing any side effects to the medications prescribed?   No  Does the patient measure his/her own blood pressure or blood glucose at home? Yes, measure BP once a week.   Does the patient have any problems obtaining medications due to transportation or finances?    No   Understanding of regimen: good Understanding of indications: excellent Potential of compliance: excellent    Comments: Meds reviewed     Barkley Li MS, ACSM-CEP  06/20/2023  11:08 AM

## 2023-06-24 ENCOUNTER — Encounter (HOSPITAL_COMMUNITY)
Admission: RE | Admit: 2023-06-24 | Discharge: 2023-06-24 | Disposition: A | Discharge status: Home / Self Care | Source: Ambulatory Visit | Attending: Cardiology | Admitting: Cardiology

## 2023-06-24 DIAGNOSIS — I214 Non-ST elevation (NSTEMI) myocardial infarction: Secondary | ICD-10-CM

## 2023-06-24 DIAGNOSIS — Z955 Presence of coronary angioplasty implant and graft: Secondary | ICD-10-CM

## 2023-06-24 NOTE — Progress Notes (Signed)
 Daily Session Note  Patient Details  Name: Christopher Lara MRN: [id number] Date of Birth: March 22, 1975 Referring Provider:   Flowsheet Row INTENSIVE CARDIAC REHAB ORIENT from 06/20/2023 in [facility]  Referring Provider Dr. [place] Peter       Encounter Date: 06/24/2023  Check In:  Session Check In - 06/24/23 0705       Check-In   Supervising physician immediately available to respond to emergencies [facility] MD immediately available    Physician(s) Lawana Pray, NP    Location [facility]    Staff Present Bella Bowman, MS, Exercise Physiologist;Kaylee Nolon Baxter, MS, ACSM-CEP, Exercise Physiologist;Jamaiyah Pyle Hadassah Letters, RN, MHA;Jetta Walker BS, ACSM-CEP, Exercise Physiologist    Virtual Visit No    Medication changes reported     No    Fall or balance concerns reported    No    Tobacco Cessation No Change    Warm-up and Cool-down Performed as group-led instruction    Resistance Training Performed Yes    VAD Patient? No    PAD/SET Patient? No      Pain Assessment   Currently in Pain? No/denies    Pain Score 0-No pain    Multiple Pain Sites No             Capillary Blood Glucose: No results found for this or any previous visit (from the past 24 hours).   Exercise Prescription Changes - 06/24/23 0821       Response to Exercise   Blood Pressure (Admit) 128/80    Blood Pressure (Exercise) 144/82    Blood Pressure (Exit) 110/66    Heart Rate (Admit) 84 bpm    Heart Rate (Exercise) 144 bpm    Heart Rate (Exit) 97 bpm    Rating of Perceived Exertion (Exercise) 10    Perceived Dyspnea (Exercise) 0    Symptoms none    Comments Pt first day in the [facility] program    Duration Progress to 30 minutes of  aerobic without signs/symptoms of physical distress    Intensity THRR unchanged      Progression   Progression Continue to progress workloads to maintain intensity without signs/symptoms of physical  distress.    Average METs 4.63      Resistance Training   Training Prescription Yes    Weight 4    Reps 10-15    Time 10 Minutes      Bike   Level 2    Watts 44    Minutes 15    METs 4.6      Elliptical   Level 1    Speed 1    Minutes 15    METs 3.6   Moved up level 1/3 AVG MET's 5.7. Just over THRR, DEC wl for next visit            Social History   Tobacco Use  Smoking Status Never   Passive exposure: Never  Smokeless Tobacco Never    Goals Met:  Independence with exercise equipment Exercise tolerated well No report of concerns or symptoms today Strength training completed today  Goals Unmet:  Not Applicable  Comments: Pt in today for cardiac rehab. Pt tolerated light exercise without difficulty. VSS, telemetry-Sinus rhythm, asymptomatic. Medication list reconciled at orientation last week with no changes to report today. Pt denies barriers to medication compliance.  Pt oriented to exercise equipment and routine. Understanding verbalized.    Dr. Gaylyn Keas is Medical  Director for Cardiac Rehab at [facility].

## 2023-06-26 ENCOUNTER — Encounter (HOSPITAL_COMMUNITY)
Admission: RE | Admit: 2023-06-26 | Discharge: 2023-06-26 | Disposition: A | Discharge status: Home / Self Care | Source: Ambulatory Visit | Attending: Cardiology | Admitting: Cardiology

## 2023-06-26 DIAGNOSIS — I214 Non-ST elevation (NSTEMI) myocardial infarction: Secondary | ICD-10-CM | POA: Diagnosis not present

## 2023-06-26 DIAGNOSIS — Z955 Presence of coronary angioplasty implant and graft: Secondary | ICD-10-CM | POA: Diagnosis not present

## 2023-06-26 NOTE — Telephone Encounter (Signed)
 Completed FMLA form scanned to chart.  Patient notified to pick up form at front desk. Billing notified.

## 2023-06-28 ENCOUNTER — Encounter (HOSPITAL_COMMUNITY)
Admission: RE | Admit: 2023-06-28 | Discharge: 2023-06-28 | Disposition: A | Discharge status: Home / Self Care | Source: Ambulatory Visit | Attending: Cardiology | Admitting: Cardiology

## 2023-06-28 DIAGNOSIS — Z955 Presence of coronary angioplasty implant and graft: Secondary | ICD-10-CM | POA: Diagnosis not present

## 2023-06-28 DIAGNOSIS — I214 Non-ST elevation (NSTEMI) myocardial infarction: Secondary | ICD-10-CM | POA: Insufficient documentation

## 2023-07-01 ENCOUNTER — Encounter (HOSPITAL_COMMUNITY)
Admission: RE | Admit: 2023-07-01 | Discharge: 2023-07-01 | Disposition: A | Discharge status: Home / Self Care | Source: Ambulatory Visit | Attending: Cardiology

## 2023-07-01 DIAGNOSIS — I214 Non-ST elevation (NSTEMI) myocardial infarction: Secondary | ICD-10-CM

## 2023-07-01 DIAGNOSIS — Z955 Presence of coronary angioplasty implant and graft: Secondary | ICD-10-CM | POA: Diagnosis not present

## 2023-07-03 ENCOUNTER — Encounter (HOSPITAL_COMMUNITY)
Admission: RE | Admit: 2023-07-03 | Discharge: 2023-07-03 | Disposition: A | Discharge status: Home / Self Care | Source: Ambulatory Visit | Attending: Cardiology | Admitting: Cardiology

## 2023-07-03 DIAGNOSIS — Z955 Presence of coronary angioplasty implant and graft: Secondary | ICD-10-CM | POA: Diagnosis not present

## 2023-07-03 DIAGNOSIS — I214 Non-ST elevation (NSTEMI) myocardial infarction: Secondary | ICD-10-CM | POA: Diagnosis not present

## 2023-07-05 ENCOUNTER — Encounter (HOSPITAL_COMMUNITY)
Admission: RE | Admit: 2023-07-05 | Discharge: 2023-07-05 | Disposition: A | Discharge status: Home / Self Care | Source: Ambulatory Visit | Attending: Cardiology | Admitting: Cardiology

## 2023-07-05 DIAGNOSIS — I214 Non-ST elevation (NSTEMI) myocardial infarction: Secondary | ICD-10-CM | POA: Diagnosis not present

## 2023-07-05 DIAGNOSIS — Z955 Presence of coronary angioplasty implant and graft: Secondary | ICD-10-CM

## 2023-07-08 ENCOUNTER — Encounter (HOSPITAL_COMMUNITY)
Admission: RE | Admit: 2023-07-08 | Discharge: 2023-07-08 | Disposition: A | Discharge status: Home / Self Care | Source: Ambulatory Visit | Attending: Cardiology

## 2023-07-08 DIAGNOSIS — I214 Non-ST elevation (NSTEMI) myocardial infarction: Secondary | ICD-10-CM

## 2023-07-08 DIAGNOSIS — Z955 Presence of coronary angioplasty implant and graft: Secondary | ICD-10-CM

## 2023-07-10 ENCOUNTER — Encounter (HOSPITAL_COMMUNITY)
Admission: RE | Admit: 2023-07-10 | Discharge: 2023-07-10 | Disposition: A | Discharge status: Home / Self Care | Source: Ambulatory Visit | Attending: Cardiology | Admitting: Cardiology

## 2023-07-10 DIAGNOSIS — I214 Non-ST elevation (NSTEMI) myocardial infarction: Secondary | ICD-10-CM

## 2023-07-10 DIAGNOSIS — Z955 Presence of coronary angioplasty implant and graft: Secondary | ICD-10-CM

## 2023-07-10 NOTE — Progress Notes (Signed)
 CARDIAC REHAB PHASE 2  Reviewed home exercise with pt today. Pt is tolerating exercise well. Pt will continue to exercise on his own by biking, walking and light hand weights for 30-45 minutes per session 1-2 days a week in addition to the 3 days in [facility]. Advised pt on THRR, RPE scale, hydration and temperature/humidity precautions. Reinforced NTG use, S/S to stop exercise and when to call MD vs 911. Encouraged warm up cool down and stretches with exercise sessions. Pt verbalized understanding, all questions were answered and pt was given a copy to take home.    Darl Edu ACSM-CEP 07/10/2023 8:38 AM

## 2023-07-12 ENCOUNTER — Encounter (HOSPITAL_COMMUNITY)
Admission: RE | Admit: 2023-07-12 | Discharge: 2023-07-12 | Disposition: A | Discharge status: Home / Self Care | Source: Ambulatory Visit | Attending: Cardiology | Admitting: Cardiology

## 2023-07-12 DIAGNOSIS — I214 Non-ST elevation (NSTEMI) myocardial infarction: Secondary | ICD-10-CM

## 2023-07-12 DIAGNOSIS — Z955 Presence of coronary angioplasty implant and graft: Secondary | ICD-10-CM | POA: Diagnosis not present

## 2023-07-15 ENCOUNTER — Encounter (HOSPITAL_COMMUNITY)
Admission: RE | Admit: 2023-07-15 | Discharge: 2023-07-15 | Disposition: A | Discharge status: Home / Self Care | Source: Ambulatory Visit | Attending: Cardiology | Admitting: Cardiology

## 2023-07-15 DIAGNOSIS — Z955 Presence of coronary angioplasty implant and graft: Secondary | ICD-10-CM

## 2023-07-15 DIAGNOSIS — I214 Non-ST elevation (NSTEMI) myocardial infarction: Secondary | ICD-10-CM

## 2023-07-15 NOTE — Progress Notes (Signed)
 Cardiac Individual Treatment Plan  Patient Details  Name: Christopher Lara MRN: [id number] Date of Birth: 1975-11-07 Referring Provider:   Flowsheet Row INTENSIVE CARDIAC REHAB ORIENT from 06/20/2023 in [facility]  Referring Provider Dr. [place] Peter       Initial Encounter Date:  Flowsheet Row INTENSIVE CARDIAC REHAB ORIENT from 06/20/2023 in [facility] for Heart, Vascular, & Lung [facility]  Date 06/20/23       Visit Diagnosis: 06/04/23 NSTEMI (non-ST elevated myocardial infarction) (HCC)  06/04/23 Status post coronary artery stent placement  Patient's Home Medications on Admission:  Current Outpatient Medications:    acetaminophen  (TYLENOL ) 650 MG CR tablet, Take 650 mg by mouth every 8 (eight) hours as needed for pain., Disp: , Rfl:    aspirin  EC 81 MG tablet, Take 1 tablet (81 mg total) by mouth daily. Swallow whole., Disp: 90 tablet, Rfl: 3   ergocalciferol (VITAMIN D2) 1.25 MG (50000 UT) capsule, Take 50,000 Units by mouth once a week. On Sundays, Disp: , Rfl:    hydrocortisone  (CORTEF ) 5 MG tablet, Take 15 mg by mouth in the morning., Disp: , Rfl:    levothyroxine  (SYNTHROID ) 100 MCG tablet, Take 100 mcg by mouth every morning. (Patient not taking: Reported on 06/17/2023), Disp: , Rfl:    levothyroxine  (SYNTHROID ) 112 MCG tablet, 1 tablet in the morning on an empty stomach Orally Once a day for 30 days STOP 100 MCG TABLETS, Disp: , Rfl:    losartan  (COZAAR ) 25 MG tablet, Take 0.5 tablets (12.5 mg total) by mouth daily., Disp: 15 tablet, Rfl: 11   nitroGLYCERIN  (NITROSTAT ) 0.4 MG SL tablet, Place 1 tablet (0.4 mg total) under the tongue every 5 (five) minutes x 3 doses as needed for chest pain., Disp: 25 tablet, Rfl: 3   prasugrel  (EFFIENT ) 10 MG TABS tablet, Take 1 tablet (10 mg total) by mouth daily., Disp: 90 tablet, Rfl: 3  Past Medical History: Past Medical History:  Diagnosis Date    Hypercalcemia    Hyperparathyroidism (HCC)    Hyponatremia    Hypothyroidism    Kidney stone    Panhypopituitarism (HCC)    Pericardial effusion    Pneumonia due to COVID-19 virus     Tobacco Use: Social History   Tobacco Use  Smoking Status Never   Passive exposure: Never  Smokeless Tobacco Never    Labs: Review Flowsheet       Latest Ref Rng & Units 06/05/2023  Labs for ITP Cardiac and Pulmonary Rehab  Cholestrol 0 - 200 mg/dL 244   LDL (calc) 0 - 99 mg/dL 010   HDL-C >27 mg/dL 35   Trlycerides <253 mg/dL 664   Hemoglobin Q0H 4.8 - 5.6 % 4.8     Capillary Blood Glucose: Lab Results  Component Value Date   GLUCAP 80 02/22/2021   GLUCAP 58 (L) 02/22/2021   GLUCAP 74 03/18/2019   GLUCAP 65 (L) 03/18/2019   GLUCAP 90 03/15/2019     Exercise Target Goals: Exercise Program Goal: Individual exercise prescription set using results from initial 6 min walk test and THRR while considering  patient's activity barriers and safety.   Exercise Prescription Goal: Initial exercise prescription builds to 30-45 minutes a day of aerobic activity, 2-3 days per week.  Home exercise guidelines will be given to patient during program as part of exercise prescription that the participant will acknowledge.  Activity Barriers & Risk Stratification:  Activity Barriers &  Cardiac Risk Stratification - 06/20/23 1101       Activity Barriers & Cardiac Risk Stratification   Activity Barriers None    Cardiac Risk Stratification Moderate             6 Minute Walk:  6 Minute Walk     Row Name 06/20/23 1320         6 Minute Walk   Phase Initial     Distance 1440 feet     Walk Time 6 minutes     # of Rest Breaks 0     MPH 2.72     METS 4.5     RPE 8     Perceived Dyspnea  0     VO2 Peak 15.83     Symptoms No     Resting HR 70 bpm     Resting BP 112/68     Resting Oxygen Saturation  100 %     Max Ex. HR 118 bpm     Max Ex. BP 122/70     2 Minute Post BP 120/76               Oxygen Initial Assessment:   Oxygen Re-Evaluation:   Oxygen Discharge (Final Oxygen Re-Evaluation):   Initial Exercise Prescription:  Initial Exercise Prescription - 06/20/23 1300       Date of Initial Exercise RX and Referring Provider   Date 06/20/23    Referring Provider Dr. [place] Peter    Expected Discharge Date 09/11/23      Bike   Level 2    Watts 26    Minutes 15    METs 2.4      Elliptical   Level 1    Speed 1    Minutes 15    METs 3.4      Prescription Details   Frequency (times per week) 3 days/week    Duration Progress to 30 minutes of continuous aerobic without signs/symptoms of physical distress      Intensity   THRR 40-80% of Max Heartrate 68-137    Ratings of Perceived Exertion 11-13      Progression   Progression Continue to progress workloads to maintain intensity without signs/symptoms of physical distress.      Resistance Training   Training Prescription Yes    Weight 4    Reps 10-15             Perform Capillary Blood Glucose checks as needed.  Exercise Prescription Changes:   Exercise Prescription Changes     Row Name 06/24/23 [phone] 07/10/23 0800           Response to Exercise   Blood Pressure (Admit) 128/80 120/62      Blood Pressure (Exercise) 144/82 --      Blood Pressure (Exit) 110/66 104/68      Heart Rate (Admit) 84 bpm 78 bpm      Heart Rate (Exercise) 144 bpm 124 bpm      Heart Rate (Exit) 97 bpm 87 bpm      Rating of Perceived Exertion (Exercise) 10 10      Perceived Dyspnea (Exercise) 0 0      Symptoms none 0      Comments Pt first day in the [facility] iCR program Reviewed MET's, goals and home ExRx      Duration Progress to 30 minutes of  aerobic without signs/symptoms of physical distress Progress to 30 minutes of  aerobic without signs/symptoms of physical distress  Intensity THRR unchanged THRR unchanged        Progression   Progression Continue to progress workloads to maintain intensity  without signs/symptoms of physical distress. Continue to progress workloads to maintain intensity without signs/symptoms of physical distress.      Average METs 4.63 4.9        Resistance Training   Training Prescription Yes No      Weight 4 4      Reps 10-15 10-15      Time 10 Minutes 10 Minutes        Bike   Level 2 2.5      Watts 44 38      Minutes 15 15      METs 4.6 4.8        Elliptical   Level 1 1      Speed 1 3      Minutes 15 15      METs 3.6  Moved up level 1/3 AVG MET's 5.7. Just over THRR, DEC wl for next visit 5        Home Exercise Plan   Plans to continue exercise at -- Home (comment)      Frequency -- Add 2 additional days to program exercise sessions.      Initial Home Exercises Provided -- 07/10/23               Exercise Comments:   Exercise Comments     Row Name 06/20/23 1102 06/24/23 0829 07/10/23 0838       Exercise Comments Pt orientation. Pt maintain SR during . Pt asymptomatic. Pt first day in teh [facility] ICR program. Pt tolerated exercise well with an average MET level of 4.63. Pt is off to a good start, attempted to INC WL but over THRR, will work with pt to get intensities in THRR. Reviewed MET's, goals and home ExRx. Pt tolerated exercise well with an average MET level of 4.9. He is doing well and progressing MET's. He is already feeling an increase in strength and stamina and is increasing his nutrition knowledge. Another goal was to get into a regular exercise routine, he's been doing great here, vitals good so he is planning on adding some days in at home by biking, walking and using some light hand weights. He plans to add in 1-2 days for 30-45 mins a session              Exercise Goals and Review:   Exercise Goals     Row Name 06/20/23 1101             Exercise Goals   Increase Physical Activity Yes       Intervention Provide advice, education, support and counseling about physical activity/exercise needs.;Develop an  individualized exercise prescription for aerobic and resistive training based on initial evaluation findings, risk stratification, comorbidities and participant's personal goals.       Expected Outcomes Short Term: Attend rehab on a regular basis to increase amount of physical activity.;Long Term: Exercising regularly at least 3-5 days a week.;Long Term: Add in home exercise to make exercise part of routine and to increase amount of physical activity.       Increase Strength and Stamina Yes       Intervention Provide advice, education, support and counseling about physical activity/exercise needs.;Develop an individualized exercise prescription for aerobic and resistive training based on initial evaluation findings, risk stratification, comorbidities and participant's personal goals.       Expected  Outcomes Short Term: Increase workloads from initial exercise prescription for resistance, speed, and METs.;Short Term: Perform resistance training exercises routinely during rehab and add in resistance training at home;Long Term: Improve cardiorespiratory fitness, muscular endurance and strength as measured by increased METs and functional capacity ( )       Able to understand and use rate of perceived exertion (RPE) scale Yes       Intervention Provide education and explanation on how to use RPE scale       Expected Outcomes Short Term: Able to use RPE daily in rehab to express subjective intensity level;Long Term:  Able to use RPE to guide intensity level when exercising independently       Knowledge and understanding of Target Heart Rate Range (THRR) Yes       Intervention Provide education and explanation of THRR including how the numbers were predicted and where they are located for reference       Expected Outcomes Short Term: Able to state/look up THRR;Long Term: Able to use THRR to govern intensity when exercising independently;Short Term: Able to use daily as guideline for intensity in rehab        Understanding of Exercise Prescription Yes       Intervention Provide education, explanation, and written materials on patient's individual exercise prescription       Expected Outcomes Short Term: Able to explain program exercise prescription;Long Term: Able to explain home exercise prescription to exercise independently                Exercise Goals Re-Evaluation :  Exercise Goals Re-Evaluation     Row Name 06/24/23 0827 07/10/23 0835           Exercise Goal Re-Evaluation   Exercise Goals Review Increase Physical Activity;Understanding of Exercise Prescription;Increase Strength and Stamina;Knowledge and understanding of Target Heart Rate Range (THRR);Able to understand and use rate of perceived exertion (RPE) scale Increase Physical Activity;Understanding of Exercise Prescription;Increase Strength and Stamina;Knowledge and understanding of Target Heart Rate Range (THRR);Able to understand and use rate of perceived exertion (RPE) scale      Comments Pt first day in teh [facility] ICR program. Pt tolerated exercise well with an average MET level of 4.63. Pt is off to a good start, attempted to INC WL but over THRR, will work with pt to get intensities in THRR. Reviewed MET's, goals and home ExRx. Pt tolerated exercise well with an average MET level of 4.9. He is doing well and progressing MET's. He is already feeling an increase in strength and stamina and is increasing his nutrition knowledge. Another goal was to get into a regular exercise routine, he's been doing great here, vitals good so he is planning on adding some days in at home by biking, walking and using some light hand weights. He plans to add in 1-2 days for 30-45 mins a session      Expected Outcomes Will continue to monitor pt and progress workloads as tolerated without sign or symptom Will continue to monitor pt and progress workloads as tolerated without sign or symptom               Discharge Exercise Prescription (Final  Exercise Prescription Changes):  Exercise Prescription Changes - 07/10/23 0800       Response to Exercise   Blood Pressure (Admit) 120/62    Blood Pressure (Exit) 104/68    Heart Rate (Admit) 78 bpm    Heart Rate (Exercise) 124 bpm    Heart Rate (  Exit) 87 bpm    Rating of Perceived Exertion (Exercise) 10    Perceived Dyspnea (Exercise) 0    Symptoms 0    Comments Reviewed MET's, goals and home ExRx    Duration Progress to 30 minutes of  aerobic without signs/symptoms of physical distress    Intensity THRR unchanged      Progression   Progression Continue to progress workloads to maintain intensity without signs/symptoms of physical distress.    Average METs 4.9      Resistance Training   Training Prescription No    Weight 4    Reps 10-15    Time 10 Minutes      Bike   Level 2.5    Watts 38    Minutes 15    METs 4.8      Elliptical   Level 1    Speed 3    Minutes 15    METs 5      Home Exercise Plan   Plans to continue exercise at Home (comment)    Frequency Add 2 additional days to program exercise sessions.    Initial Home Exercises Provided 07/10/23             Nutrition:  Target Goals: Understanding of nutrition guidelines, daily intake of sodium 1500mg , cholesterol 200mg , calories 30% from fat and 7% or less from saturated fats, daily to have 5 or more servings of fruits and vegetables.  Biometrics:  Pre Biometrics - 06/20/23 1100       Pre Biometrics   Waist Circumference 37 inches    Hip Circumference 39.5 inches    Waist to Hip Ratio 0.94 %    Triceps Skinfold 10 mm    % Body Fat 23.1 %    Grip Strength 32 kg    Flexibility 12.5 in    Single Leg Stand 15.68 seconds              Nutrition Therapy Plan and Nutrition Goals:  Nutrition Therapy & Goals - 06/24/23 0836       Nutrition Therapy   Diet Heart healthy diet      Personal Nutrition Goals   Nutrition Goal Patient to identify strategies for reducing cardiovascular risk by  attending the [facility] education and nutrition series weekly.    Personal Goal #2 Patient to improve diet quality by using the plate method as a guide for meal planning to include lean protein/plant protein, fruits, vegetables, whole grains, nonfat dairy as part of a well-balanced diet.    Comments Christopher Lara has medical history of NSTEMI, CAD, hyperlipidemia.  In addition, he has medical history of panhypopituitarism, hypothyroidism, hyperparathyroidism, hypercalcemia managed by [facility] endocrinology. He is unable to utilize statin therapy due to chronically elevated CK; referred to [facility] for consideration of PCSK9i, LDL goal <55 and lipids are not currently at goal. Patient will benefit from participation in intensive cardiac rehab for nutrition, exercise, and lifestyle modification.      Intervention Plan   Intervention Prescribe, educate and counsel regarding individualized specific dietary modifications aiming towards targeted core components such as weight, hypertension, lipid management, diabetes, heart failure and other comorbidities.;Nutrition handout(s) given to patient.    Expected Outcomes Short Term Goal: Understand basic principles of dietary content, such as calories, fat, sodium, cholesterol and nutrients.;Long Term Goal: Adherence to prescribed nutrition plan.             Nutrition Assessments:  Nutrition Assessments - 06/24/23 0840  Rate Your Plate Scores   Pre Score 75            MEDIFICTS Score Key: >=70 Need to make dietary changes  40-70 Heart Healthy Diet <= 40 Therapeutic Level Cholesterol Diet   Flowsheet Row INTENSIVE CARDIAC REHAB from 06/24/2023 in [facility] for Heart, Vascular, & Lung Health  Picture Your Plate Total Score on Admission 75      Picture Your Plate Scores: <45 Unhealthy dietary pattern with much room for improvement. 41-50 Dietary pattern unlikely to meet recommendations for good health and room for  improvement. 51-60 More healthful dietary pattern, with some room for improvement.  >60 Healthy dietary pattern, although there may be some specific behaviors that could be improved.    Nutrition Goals Re-Evaluation:  Nutrition Goals Re-Evaluation     Row Name 06/24/23 0836             Goals   Current Weight 163 lb 2.3 oz (74 kg)       Comment HDL 35, LDL 117, Lpa 72, A1c WNL       Expected Outcome Christopher Lara has medical history of NSTEMI, CAD, hyperlipidemia. In addition, he has medical history of panhypopituitarism, hypothyroidism, hyperparathyroidism, hypercalcemia managed by [facility] endocrinology. He is unable to utilize statin therapy due to chronically elevated CK; referred to [facility] for consideration of PCSK9i, LDL goal <55 and lipids are not currently at goal. Patient will benefit from participation in intensive cardiac rehab for nutrition, exercise, and lifestyle modification.                Nutrition Goals Re-Evaluation:  Nutrition Goals Re-Evaluation     Row Name 06/24/23 0836             Goals   Current Weight 163 lb 2.3 oz (74 kg)       Comment HDL 35, LDL 117, Lpa 72, A1c WNL       Expected Outcome Christopher Lara has medical history of NSTEMI, CAD, hyperlipidemia. In addition, he has medical history of panhypopituitarism, hypothyroidism, hyperparathyroidism, hypercalcemia managed by [facility] endocrinology. He is unable to utilize statin therapy due to chronically elevated CK; referred to [facility] for consideration of PCSK9i, LDL goal <55 and lipids are not currently at goal. Patient will benefit from participation in intensive cardiac rehab for nutrition, exercise, and lifestyle modification.                Nutrition Goals Discharge (Final Nutrition Goals Re-Evaluation):  Nutrition Goals Re-Evaluation - 06/24/23 0836       Goals   Current Weight 163 lb 2.3 oz (74 kg)    Comment HDL 35, LDL 117, Lpa 72, A1c WNL    Expected Outcome Christopher Lara has medical  history of NSTEMI, CAD, hyperlipidemia. In addition, he has medical history of panhypopituitarism, hypothyroidism, hyperparathyroidism, hypercalcemia managed by [facility] endocrinology. He is unable to utilize statin therapy due to chronically elevated CK; referred to [facility] for consideration of PCSK9i, LDL goal <55 and lipids are not currently at goal. Patient will benefit from participation in intensive cardiac rehab for nutrition, exercise, and lifestyle modification.             Psychosocial: Target Goals: Acknowledge presence or absence of significant depression and/or stress, maximize coping skills, provide positive support system. Participant is able to verbalize types and ability to use techniques and skills needed for reducing stress and depression.  Initial Review & Psychosocial Screening:  Initial Psych Review &  Screening - 06/27/23 1513       Initial Review   Current issues with None Identified      Family Dynamics   Good Support System? Yes      Barriers   Psychosocial barriers to participate in program There are no identifiable barriers or psychosocial needs.      Screening Interventions   Interventions Encouraged to exercise;To provide support and resources with identified psychosocial needs;Provide feedback about the scores to participant    Expected Outcomes Short Term goal: Utilizing psychosocial counselor, staff and physician to assist with identification of specific Stressors or current issues interfering with healing process. Setting desired goal for each stressor or current issue identified.;Long Term Goal: Stressors or current issues are controlled or eliminated.;Short Term goal: Identification and review with participant of any Quality of Life or Depression concerns found by scoring the questionnaire.;Long Term goal: The participant improves quality of Life and PHQ9 Scores as seen by post scores and/or verbalization of changes             Quality of  Life Scores:  Quality of Life - 06/20/23 1342       Quality of Life   Select Quality of Life      Quality of Life Scores   Health/Function Pre 23.43 %    Socioeconomic Pre 26.43 %    Psych/Spiritual Pre 24.86 %    Family Pre 27 %    GLOBAL Pre 24.8 %            Scores of 19 and below usually indicate a poorer quality of life in these areas.  A difference of  2-3 points is a clinically meaningful difference.  A difference of 2-3 points in the total score of the Quality of Life Index has been associated with significant improvement in overall quality of life, self-image, physical symptoms, and general health in studies assessing change in quality of life.  PHQ-9: Review Flowsheet       06/20/2023 05/16/2022 04/03/2021 03/26/2019  Depression screen PHQ 2/9  Decreased Interest 1 0 0 0  Down, Depressed, Hopeless 0 0 0 0  PHQ - 2 Score 1 0 0 0  Altered sleeping 0 - 0 -  Tired, decreased energy 1 - 0 -  Change in appetite 0 - 0 -  Feeling bad or failure about yourself  0 - 0 -  Trouble concentrating 0 - 0 -  Moving slowly or fidgety/restless 0 - 0 -  Suicidal thoughts 0 - 0 -  PHQ-9 Score 2 - 0 -  Difficult doing work/chores Somewhat difficult - - -   Interpretation of Total Score  Total Score Depression Severity:  1-4 = Minimal depression, 5-9 = Mild depression, 10-14 = Moderate depression, 15-19 = Moderately severe depression, 20-27 = Severe depression   Psychosocial Evaluation and Intervention:   Psychosocial Re-Evaluation:  Psychosocial Re-Evaluation     Row Name 07/12/23 1326             Psychosocial Re-Evaluation   Current issues with None Identified       Interventions Encouraged to attend Cardiac Rehabilitation for the exercise       Continue Psychosocial Services  No Follow up required                Psychosocial Discharge (Final Psychosocial Re-Evaluation):  Psychosocial Re-Evaluation - 07/12/23 1326       Psychosocial Re-Evaluation   Current  issues with None Identified    Interventions Encouraged to attend  Cardiac Rehabilitation for the exercise    Continue Psychosocial Services  No Follow up required             Vocational Rehabilitation: Provide vocational rehab assistance to qualifying candidates.   Vocational Rehab Evaluation & Intervention:  Vocational Rehab - 06/20/23 1113       Initial Vocational Rehab Evaluation & Intervention   Assessment shows need for Vocational Rehabilitation No   Currently working            Education: Education Goals: Education classes will be provided on a weekly basis, covering required topics. Participant will state understanding/return demonstration of topics presented.    Education     Row Name 06/24/23 0800     Education   Cardiac Education Topics [facility]   Geographical information systems officer Psychosocial   Psychosocial Workshop From Head to Heart: The Power of a Healthy Outlook   Instruction Review Code 1- Verbalizes Understanding   Class Start Time 0815   Class Stop Time 0900   Class Time Calculation (min) 45 min    Row Name 06/26/23 0900     Education   Cardiac Education Topics [facility]   Secondary school teacher School   Educator Dietitian   Weekly Topic Tasty Appetizers and Snacks   Instruction Review Code 1- Verbalizes Understanding   Class Start Time 0815   Class Stop Time 0855   Class Time Calculation (min) 40 min    Row Name 06/28/23 0800     Education   Cardiac Education Topics [facility]   Psychologist, forensic General Education   General Education Heart Disease Risk Reduction   Instruction Review Code 1- Verbalizes Understanding   Class Start Time 0813   Class Stop Time 0850   Class Time Calculation (min) 37 min    Row Name 07/01/23 0900     Education   Cardiac Education Topics [facility]   Select Core Videos     Core Videos    Educator Exercise Physiologist   Select Psychosocial   Psychosocial Healthy Minds, Bodies, Hearts   Instruction Review Code 1- Verbalizes Understanding   Class Start Time 0815   Class Stop Time 0850   Class Time Calculation (min) 35 min    Row Name 07/05/23 0900     Education   Cardiac Education Topics [facility]   Select Workshops     Workshops   Educator Exercise Physiologist   Select Exercise   Exercise Workshop Location manager and Fall Prevention   Instruction Review Code 1- Verbalizes Understanding   Class Start Time 0810   Class Stop Time 0850   Class Time Calculation (min) 40 min    Row Name 07/08/23 0900     Education   Cardiac Education Topics [facility]   Glass blower/designer Nutrition   Nutrition Workshop Label Reading   Instruction Review Code 1- Verbalizes Understanding   Class Start Time 0815   Class Stop Time 0900   Class Time Calculation (min) 45 min    Row Name 07/10/23 0900     Education   Cardiac Education Topics [facility]   Select Core Videos     Core Videos   Educator Exercise Physiologist   Select Nutrition   Nutrition Other  Label reading   Instruction  Review Code 1- Verbalizes Understanding   Class Start Time 0813   Class Stop Time 0844   Class Time Calculation (min) 31 min    Row Name 07/15/23 0700     Education   Cardiac Education Topics Pritikin   Select Core Videos     Core Videos   Educator Exercise Physiologist   Select General Education   General Education Metabolic Syndrome and Belly Fat   Instruction Review Code 1- Verbalizes Understanding   Class Start Time 0815   Class Stop Time 0851   Class Time Calculation (min) 36 min            Core Videos: Exercise    Move It!  Clinical staff conducted group or individual video education with verbal and written material and guidebook.  Patient learns the recommended Pritikin exercise program. Exercise with the goal of living a  long, healthy life. Some of the health benefits of exercise include controlled diabetes, healthier blood pressure levels, improved cholesterol levels, improved heart and lung capacity, improved sleep, and better body composition. Everyone should speak with their doctor before starting or changing an exercise routine.  Biomechanical Limitations Clinical staff conducted group or individual video education with verbal and written material and guidebook.  Patient learns how biomechanical limitations can impact exercise and how we can mitigate and possibly overcome limitations to have an impactful and balanced exercise routine.  Body Composition Clinical staff conducted group or individual video education with verbal and written material and guidebook.  Patient learns that body composition (ratio of muscle mass to fat mass) is a key component to assessing overall fitness, rather than body weight alone. Increased fat mass, especially visceral belly fat, can put us  at increased risk for metabolic syndrome, type 2 diabetes, heart disease, and even death. It is recommended to combine diet and exercise (cardiovascular and resistance training) to improve your body composition. Seek guidance from your physician and exercise physiologist before implementing an exercise routine.  Exercise Action Plan Clinical staff conducted group or individual video education with verbal and written material and guidebook.  Patient learns the recommended strategies to achieve and enjoy long-term exercise adherence, including variety, self-motivation, self-efficacy, and positive decision making. Benefits of exercise include fitness, good health, weight management, more energy, better sleep, less stress, and overall well-being.  Medical   Heart Disease Risk Reduction Clinical staff conducted group or individual video education with verbal and written material and guidebook.  Patient learns our heart is our most vital organ as it  circulates oxygen, nutrients, white blood cells, and hormones throughout the entire body, and carries waste away. Data supports a plant-based eating plan like the Pritikin Program for its effectiveness in slowing progression of and reversing heart disease. The video provides a number of recommendations to address heart disease.   Metabolic Syndrome and Belly Fat  Clinical staff conducted group or individual video education with verbal and written material and guidebook.  Patient learns what metabolic syndrome is, how it leads to heart disease, and how one can reverse it and keep it from coming back. You have metabolic syndrome if you have 3 of the following 5 criteria: abdominal obesity, high blood pressure, high triglycerides, low HDL cholesterol, and high blood sugar.  Hypertension and Heart Disease Clinical staff conducted group or individual video education with verbal and written material and guidebook.  Patient learns that high blood pressure, or hypertension, is very common in the [place] . Hypertension is largely due to excessive salt intake, but  other important risk factors include being overweight, physical inactivity, drinking too much alcohol, smoking, and not eating enough potassium from fruits and vegetables. High blood pressure is a leading risk factor for heart attack, stroke, congestive heart failure, dementia, kidney failure, and premature death. Long-term effects of excessive salt intake include stiffening of the arteries and thickening of heart muscle and organ damage. Recommendations include ways to reduce hypertension and the risk of heart disease.  Diseases of Our Time - Focusing on Diabetes Clinical staff conducted group or individual video education with verbal and written material and guidebook.  Patient learns why the best way to stop diseases of our time is prevention, through food and other lifestyle changes. Medicine (such as prescription pills and surgeries) is often  only a Band-Aid on the problem, not a long-term solution. Most common diseases of our time include obesity, type 2 diabetes, hypertension, heart disease, and cancer. The Pritikin Program is recommended and has been proven to help reduce, reverse, and/or prevent the damaging effects of metabolic syndrome.  Nutrition   Overview of the Pritikin Eating Plan  Clinical staff conducted group or individual video education with verbal and written material and guidebook.  Patient learns about the Pritikin Eating Plan for disease risk reduction. The Pritikin Eating Plan emphasizes a wide variety of unrefined, minimally-processed carbohydrates, like fruits, vegetables, whole grains, and legumes. Go, Caution, and Stop food choices are explained. Plant-based and lean animal proteins are emphasized. Rationale provided for low sodium intake for blood pressure control, low added sugars for blood sugar stabilization, and low added fats and oils for coronary artery disease risk reduction and weight management.  Calorie Density  Clinical staff conducted group or individual video education with verbal and written material and guidebook.  Patient learns about calorie density and how it impacts the Pritikin Eating Plan. Knowing the characteristics of the food you choose will help you decide whether those foods will lead to weight gain or weight loss, and whether you want to consume more or less of them. Weight loss is usually a side effect of the Pritikin Eating Plan because of its focus on low calorie-dense foods.  Label Reading  Clinical staff conducted group or individual video education with verbal and written material and guidebook.  Patient learns about the Pritikin recommended label reading guidelines and corresponding recommendations regarding calorie density, added sugars, sodium content, and whole grains.  Dining Out - Part 1  Clinical staff conducted group or individual video education with verbal and written  material and guidebook.  Patient learns that restaurant meals can be sabotaging because they can be so high in calories, fat, sodium, and/or sugar. Patient learns recommended strategies on how to positively address this and avoid unhealthy pitfalls.  Facts on Fats  Clinical staff conducted group or individual video education with verbal and written material and guidebook.  Patient learns that lifestyle modifications can be just as effective, if not more so, as many medications for lowering your risk of heart disease. A Pritikin lifestyle can help to reduce your risk of inflammation and atherosclerosis (cholesterol build-up, or plaque, in the artery walls). Lifestyle interventions such as dietary choices and physical activity address the cause of atherosclerosis. A review of the types of fats and their impact on blood cholesterol levels, along with dietary recommendations to reduce fat intake is also included.  Nutrition Action Plan  Clinical staff conducted group or individual video education with verbal and written material and guidebook.  Patient learns how to incorporate Pritikin  recommendations into their lifestyle. Recommendations include planning and keeping personal health goals in mind as an important part of their success.  Healthy Mind-Set    Healthy Minds, Bodies, Hearts  Clinical staff conducted group or individual video education with verbal and written material and guidebook.  Patient learns how to identify when they are stressed. Video will discuss the impact of that stress, as well as the many benefits of stress management. Patient will also be introduced to stress management techniques. The way we think, act, and feel has an impact on our hearts.  How Our Thoughts Can Heal Our Hearts  Clinical staff conducted group or individual video education with verbal and written material and guidebook.  Patient learns that negative thoughts can cause depression and anxiety. This can result in  negative lifestyle behavior and serious health problems. Cognitive behavioral therapy is an effective method to help control our thoughts in order to change and improve our emotional outlook.  Additional Videos:  Exercise    Improving Performance  Clinical staff conducted group or individual video education with verbal and written material and guidebook.  Patient learns to use a non-linear approach by alternating intensity levels and lengths of time spent exercising to help burn more calories and lose more body fat. Cardiovascular exercise helps improve heart health, metabolism, hormonal balance, blood sugar control, and recovery from fatigue. Resistance training improves strength, endurance, balance, coordination, reaction time, metabolism, and muscle mass. Flexibility exercise improves circulation, posture, and balance. Seek guidance from your physician and exercise physiologist before implementing an exercise routine and learn your capabilities and proper form for all exercise.  Introduction to Yoga  Clinical staff conducted group or individual video education with verbal and written material and guidebook.  Patient learns about yoga, a discipline of the coming together of mind, breath, and body. The benefits of yoga include improved flexibility, improved range of motion, better posture and core strength, increased lung function, weight loss, and positive self-image. Yoga's heart health benefits include lowered blood pressure, healthier heart rate, decreased cholesterol and triglyceride levels, improved immune function, and reduced stress. Seek guidance from your physician and exercise physiologist before implementing an exercise routine and learn your capabilities and proper form for all exercise.  Medical   Aging: Enhancing Your Quality of Life  Clinical staff conducted group or individual video education with verbal and written material and guidebook.  Patient learns key strategies and  recommendations to stay in good physical health and enhance quality of life, such as prevention strategies, having an advocate, securing a Health Care Proxy and Power of Attorney, and keeping a list of medications and system for tracking them. It also discusses how to avoid risk for bone loss.  Biology of Weight Control  Clinical staff conducted group or individual video education with verbal and written material and guidebook.  Patient learns that weight gain occurs because we consume more calories than we burn (eating more, moving less). Even if your body weight is normal, you may have higher ratios of fat compared to muscle mass. Too much body fat puts you at increased risk for cardiovascular disease, heart attack, stroke, type 2 diabetes, and obesity-related cancers. In addition to exercise, following the Pritikin Eating Plan can help reduce your risk.  Decoding Lab Results  Clinical staff conducted group or individual video education with verbal and written material and guidebook.  Patient learns that lab test reflects one measurement whose values change over time and are influenced by many factors, including medication, stress,  sleep, exercise, food, hydration, pre-existing medical conditions, and more. It is recommended to use the knowledge from this video to become more involved with your lab results and evaluate your numbers to speak with your doctor.   Diseases of Our Time - Overview  Clinical staff conducted group or individual video education with verbal and written material and guidebook.  Patient learns that according to the CDC, 50% to 70% of chronic diseases (such as obesity, type 2 diabetes, elevated lipids, hypertension, and heart disease) are avoidable through lifestyle improvements including healthier food choices, listening to satiety cues, and increased physical activity.  Sleep Disorders Clinical staff conducted group or individual video education with verbal and written  material and guidebook.  Patient learns how good quality and duration of sleep are important to overall health and well-being. Patient also learns about sleep disorders and how they impact health along with recommendations to address them, including discussing with a physician.  Nutrition  Dining Out - Part 2 Clinical staff conducted group or individual video education with verbal and written material and guidebook.  Patient learns how to plan ahead and communicate in order to maximize their dining experience in a healthy and nutritious manner. Included are recommended food choices based on the type of restaurant the patient is visiting.   Fueling a Banker conducted group or individual video education with verbal and written material and guidebook.  There is a strong connection between our food choices and our health. Diseases like obesity and type 2 diabetes are very prevalent and are in large-part due to lifestyle choices. The Pritikin Eating Plan provides plenty of food and hunger-curbing satisfaction. It is easy to follow, affordable, and helps reduce health risks.  Menu Workshop  Clinical staff conducted group or individual video education with verbal and written material and guidebook.  Patient learns that restaurant meals can sabotage health goals because they are often packed with calories, fat, sodium, and sugar. Recommendations include strategies to plan ahead and to communicate with the manager, chef, or server to help order a healthier meal.  Planning Your Eating Strategy  Clinical staff conducted group or individual video education with verbal and written material and guidebook.  Patient learns about the Pritikin Eating Plan and its benefit of reducing the risk of disease. The Pritikin Eating Plan does not focus on calories. Instead, it emphasizes high-quality, nutrient-rich foods. By knowing the characteristics of the foods, we choose, we can determine their  calorie density and make informed decisions.  Targeting Your Nutrition Priorities  Clinical staff conducted group or individual video education with verbal and written material and guidebook.  Patient learns that lifestyle habits have a tremendous impact on disease risk and progression. This video provides eating and physical activity recommendations based on your personal health goals, such as reducing LDL cholesterol, losing weight, preventing or controlling type 2 diabetes, and reducing high blood pressure.  Vitamins and Minerals  Clinical staff conducted group or individual video education with verbal and written material and guidebook.  Patient learns different ways to obtain key vitamins and minerals, including through a recommended healthy diet. It is important to discuss all supplements you take with your doctor.   Healthy Mind-Set    Smoking Cessation  Clinical staff conducted group or individual video education with verbal and written material and guidebook.  Patient learns that cigarette smoking and tobacco addiction pose a serious health risk which affects millions of people. Stopping smoking will significantly reduce the risk of heart  disease, lung disease, and many forms of cancer. Recommended strategies for quitting are covered, including working with your doctor to develop a successful plan.  Culinary   Becoming a Set designer conducted group or individual video education with verbal and written material and guidebook.  Patient learns that cooking at home can be healthy, cost-effective, quick, and puts them in control. Keys to cooking healthy recipes will include looking at your recipe, assessing your equipment needs, planning ahead, making it simple, choosing cost-effective seasonal ingredients, and limiting the use of added fats, salts, and sugars.  Cooking - Breakfast and Snacks  Clinical staff conducted group or individual video education with verbal and  written material and guidebook.  Patient learns how important breakfast is to satiety and nutrition through the entire day. Recommendations include key foods to eat during breakfast to help stabilize blood sugar levels and to prevent overeating at meals later in the day. Planning ahead is also a key component.  Cooking - Educational psychologist conducted group or individual video education with verbal and written material and guidebook.  Patient learns eating strategies to improve overall health, including an approach to cook more at home. Recommendations include thinking of animal protein as a side on your plate rather than center stage and focusing instead on lower calorie dense options like vegetables, fruits, whole grains, and plant-based proteins, such as beans. Making sauces in large quantities to freeze for later and leaving the skin on your vegetables are also recommended to maximize your experience.  Cooking - Healthy Salads and Dressing Clinical staff conducted group or individual video education with verbal and written material and guidebook.  Patient learns that vegetables, fruits, whole grains, and legumes are the foundations of the Pritikin Eating Plan. Recommendations include how to incorporate each of these in flavorful and healthy salads, and how to create homemade salad dressings. Proper handling of ingredients is also covered. Cooking - Soups and [employer] - Soups and Desserts Clinical staff conducted group or individual video education with verbal and written material and guidebook.  Patient learns that Pritikin soups and desserts make for easy, nutritious, and delicious snacks and meal components that are low in sodium, fat, sugar, and calorie density, while high in vitamins, minerals, and filling fiber. Recommendations include simple and healthy ideas for soups and desserts.   Overview     The Pritikin Solution Program Overview Clinical staff conducted group or  individual video education with verbal and written material and guidebook.  Patient learns that the results of the Pritikin Program have been documented in more than 100 articles published in peer-reviewed journals, and the benefits include reducing risk factors for (and, in some cases, even reversing) high cholesterol, high blood pressure, type 2 diabetes, obesity, and more! An overview of the three key pillars of the Pritikin Program will be covered: eating well, doing regular exercise, and having a healthy mind-set.  WORKSHOPS  Exercise: Exercise Basics: Building Your Action Plan Clinical staff led group instruction and group discussion with PowerPoint presentation and patient guidebook. To enhance the learning environment the use of posters, models and videos may be added. At the conclusion of this workshop, patients will comprehend the difference between physical activity and exercise, as well as the benefits of incorporating both, into their routine. Patients will understand the FITT (Frequency, Intensity, Time, and Type) principle and how to use it to build an exercise action plan. In addition, safety concerns and other considerations for exercise  and cardiac rehab will be addressed by the presenter. The purpose of this lesson is to promote a comprehensive and effective weekly exercise routine in order to improve patients' overall level of fitness.   Managing Heart Disease: Your Path to a Healthier Heart Clinical staff led group instruction and group discussion with PowerPoint presentation and patient guidebook. To enhance the learning environment the use of posters, models and videos may be added.At the conclusion of this workshop, patients will understand the anatomy and physiology of the heart. Additionally, they will understand how Pritikin's three pillars impact the risk factors, the progression, and the management of heart disease.  The purpose of this lesson is to provide a high-level  overview of the heart, heart disease, and how the Pritikin lifestyle positively impacts risk factors.  Exercise Biomechanics Clinical staff led group instruction and group discussion with PowerPoint presentation and patient guidebook. To enhance the learning environment the use of posters, models and videos may be added. Patients will learn how the structural parts of their bodies function and how these functions impact their daily activities, movement, and exercise. Patients will learn how to promote a neutral spine, learn how to manage pain, and identify ways to improve their physical movement in order to promote healthy living. The purpose of this lesson is to expose patients to common physical limitations that impact physical activity. Participants will learn practical ways to adapt and manage aches and pains, and to minimize their effect on regular exercise. Patients will learn how to maintain good posture while sitting, walking, and lifting.  Balance Training and Fall Prevention  Clinical staff led group instruction and group discussion with PowerPoint presentation and patient guidebook. To enhance the learning environment the use of posters, models and videos may be added. At the conclusion of this workshop, patients will understand the importance of their sensorimotor skills (vision, proprioception, and the vestibular system) in maintaining their ability to balance as they age. Patients will apply a variety of balancing exercises that are appropriate for their current level of function. Patients will understand the common causes for poor balance, possible solutions to these problems, and ways to modify their physical environment in order to minimize their fall risk. The purpose of this lesson is to teach patients about the importance of maintaining balance as they age and ways to minimize their risk of falling.  WORKSHOPS   Nutrition:  Fueling a Ship broker led group  instruction and group discussion with PowerPoint presentation and patient guidebook. To enhance the learning environment the use of posters, models and videos may be added. Patients will review the foundational principles of the Pritikin Eating Plan and understand what constitutes a serving size in each of the food groups. Patients will also learn Pritikin-friendly foods that are better choices when away from home and review make-ahead meal and snack options. Calorie density will be reviewed and applied to three nutrition priorities: weight maintenance, weight loss, and weight gain. The purpose of this lesson is to reinforce (in a group setting) the key concepts around what patients are recommended to eat and how to apply these guidelines when away from home by planning and selecting Pritikin-friendly options. Patients will understand how calorie density may be adjusted for different weight management goals.  Mindful Eating  Clinical staff led group instruction and group discussion with PowerPoint presentation and patient guidebook. To enhance the learning environment the use of posters, models and videos may be added. Patients will briefly review the concepts of the  Pritikin Eating Plan and the importance of low-calorie dense foods. The concept of mindful eating will be introduced as well as the importance of paying attention to internal hunger signals. Triggers for non-hunger eating and techniques for dealing with triggers will be explored. The purpose of this lesson is to provide patients with the opportunity to review the basic principles of the Pritikin Eating Plan, discuss the value of eating mindfully and how to measure internal cues of hunger and fullness using the Hunger Scale. Patients will also discuss reasons for non-hunger eating and learn strategies to use for controlling emotional eating.  Targeting Your Nutrition Priorities Clinical staff led group instruction and group discussion with  PowerPoint presentation and patient guidebook. To enhance the learning environment the use of posters, models and videos may be added. Patients will learn how to determine their genetic susceptibility to disease by reviewing their family history. Patients will gain insight into the importance of diet as part of an overall healthy lifestyle in mitigating the impact of genetics and other environmental insults. The purpose of this lesson is to provide patients with the opportunity to assess their personal nutrition priorities by looking at their family history, their own health history and current risk factors. Patients will also be able to discuss ways of prioritizing and modifying the [facility] Eating Plan for their highest risk areas  Menu  Clinical staff led group instruction and group discussion with PowerPoint presentation and patient guidebook. To enhance the learning environment the use of posters, models and videos may be added. Using menus brought in from [employer], or printed from [employer], patients will apply the [facility]rit[facility]in dining out guidelines that were presented in the [employer] video. Patients will also be able to practice these guidelines in a variety of provided scenarios. The purpose of this lesson is to provide patients with the opportunity to practice hands-on learning of the [facility] Dining Out guidelines with actual menus and practice scenarios.  Label Reading Clinical staff led group instruction and group discussion with PowerPoint presentation and patient guidebook. To enhance the learning environment the use of posters, models and videos may be added. Patients will review and discuss the [facility]in label reading guidelines presented in [employer]'s Label Reading Educational series video. Using fool labels brought in from local grocery stores and markets, patients will apply the label reading guidelines and determine if the packaged food meet the [facility]rit[facility]in  guidelines. The purpose of this lesson is to provide patients with the opportunity to review, discuss, and practice hands-on learning of the [facility] Label Reading guidelines with actual packaged food labels. Cooking School  [facility]'s [employer] are designed to teach patients ways to prepare quick, simple, and affordable recipes at home. The importance of nutrition's role in chronic disease risk reduction is reflected in its emphasis in the overall [facility] program. By learning how to prepare essential core P[facility]in Eating Plan recipes, patients will increase control over what they eat; be able to customize the flavor of foods without the use of added salt, sugar, or fat; and improve the quality of the food they consume. By learning a set of core recipes which are easily assembled, quickly prepared, and affordable, patients are more likely to prepare more healthy foods at home. These workshops focus on convenient breakfasts, simple entres, side dishes, and desserts which can be prepared with minimal effort and are consistent with nutrition recommendations for cardiovascular risk reduction. Cooking [employer] are taught by a Armed forces logistics/support/administrative officer (RD)  who has been trained by the [employer] team. The chef or RD has a clear understanding of the importance of minimizing - if not completely eliminating - added fat, sugar, and sodium in recipes. Throughout the series of Cooking School Workshop sessions, patients will learn about healthy ingredients and efficient methods of cooking to build confidence in their capability to prepare    Cooking School weekly topics:  Adding Flavor- Sodium-Free  Fast and Healthy Breakfasts  Powerhouse Plant-Based Proteins  Satisfying Salads and Dressings  Simple Sides and Sauces  International Cuisine-Spotlight on the [employer] Zones  Delicious Desserts  Savory Soups  [employer] - Meals in a Astronomer Appetizers and  Snacks  Comforting Weekend Breakfasts  One-Pot Wonders   Fast Evening Meals  Landscape architect Your Pritikin Plate  WORKSHOPS   Healthy Mindset (Psychosocial):  Focused Goals, Sustainable Changes Clinical staff led group instruction and group discussion with PowerPoint presentation and patient guidebook. To enhance the learning environment the use of posters, models and videos may be added. Patients will be able to apply effective goal setting strategies to establish at least one personal goal, and then take consistent, meaningful action toward that goal. They will learn to identify common barriers to achieving personal goals and develop strategies to overcome them. Patients will also gain an understanding of how our mind-set can impact our ability to achieve goals and the importance of cultivating a positive and growth-oriented mind-set. The purpose of this lesson is to provide patients with a deeper understanding of how to set and achieve personal goals, as well as the tools and strategies needed to overcome common obstacles which may arise along the way.  From Head to Heart: The Power of a Healthy Outlook  Clinical staff led group instruction and group discussion with PowerPoint presentation and patient guidebook. To enhance the learning environment the use of posters, models and videos may be added. Patients will be able to recognize and describe the impact of emotions and mood on physical health. They will discover the importance of self-care and explore self-care practices which may work for them. Patients will also learn how to utilize the 4 C's to cultivate a healthier outlook and better manage stress and challenges. The purpose of this lesson is to demonstrate to patients how a healthy outlook is an essential part of maintaining good health, especially as they continue their cardiac rehab journey.  Healthy Sleep for a Healthy Heart Clinical staff led group instruction and  group discussion with PowerPoint presentation and patient guidebook. To enhance the learning environment the use of posters, models and videos may be added. At the conclusion of this workshop, patients will be able to demonstrate knowledge of the importance of sleep to overall health, well-being, and quality of life. They will understand the symptoms of, and treatments for, common sleep disorders. Patients will also be able to identify daytime and nighttime behaviors which impact sleep, and they will be able to apply these tools to help manage sleep-related challenges. The purpose of this lesson is to provide patients with a general overview of sleep and outline the importance of quality sleep. Patients will learn about a few of the most common sleep disorders. Patients will also be introduced to the concept of "sleep hygiene," and discover ways to self-manage certain sleeping problems through simple daily behavior changes. Finally, the workshop will motivate patients by clarifying the links between quality sleep and their goals of heart-healthy living.   Recognizing and Reducing  Stress Clinical staff led group instruction and group discussion with PowerPoint presentation and patient guidebook. To enhance the learning environment the use of posters, models and videos may be added. At the conclusion of this workshop, patients will be able to understand the types of stress reactions, differentiate between acute and chronic stress, and recognize the impact that chronic stress has on their health. They will also be able to apply different coping mechanisms, such as reframing negative self-talk. Patients will have the opportunity to practice a variety of stress management techniques, such as deep abdominal breathing, progressive muscle relaxation, and/or guided imagery.  The purpose of this lesson is to educate patients on the role of stress in their lives and to provide healthy techniques for coping with  it.  Learning Barriers/Preferences:  Learning Barriers/Preferences - 06/20/23 1112       Learning Barriers/Preferences   Learning Barriers None    Learning Preferences Group Instruction;Individual Instruction;Skilled Demonstration;Verbal Instruction             Education Topics:  Knowledge Questionnaire Score:  Knowledge Questionnaire Score - 06/20/23 1112       Knowledge Questionnaire Score   Pre Score 20/24             Core Components/Risk Factors/Patient Goals at Admission:  Personal Goals and Risk Factors at Admission - 06/20/23 1342       Core Components/Risk Factors/Patient Goals on Admission    Weight Management Yes;Weight Loss    Intervention Weight Management: Develop a combined nutrition and exercise program designed to reach desired caloric intake, while maintaining appropriate intake of nutrient and fiber, sodium and fats, and appropriate energy expenditure required for the weight goal.;Weight Management: Provide education and appropriate resources to help participant work on and attain dietary goals.    Expected Outcomes Short Term: Continue to assess and modify interventions until short term weight is achieved;Long Term: Adherence to nutrition and physical activity/exercise program aimed toward attainment of established weight goal;Weight Maintenance: Understanding of the daily nutrition guidelines, which includes 25-35% calories from fat, 7% or less cal from saturated fats, less than 200mg  cholesterol, less than 1.5gm of sodium, & 5 or more servings of fruits and vegetables daily;Weight Loss: Understanding of general recommendations for a balanced deficit meal plan, which promotes 1-2 lb weight loss per week and includes a negative energy balance of [phone] kcal/d;Understanding recommendations for meals to include 15-35% energy as protein, 25-35% energy from fat, 35-60% energy from carbohydrates, less than 200mg  of dietary cholesterol, 20-35 gm of total fiber  daily;Understanding of distribution of calorie intake throughout the day with the consumption of 4-5 meals/snacks    Hypertension Yes    Intervention Provide education on lifestyle modifcations including regular physical activity/exercise, weight management, moderate sodium restriction and increased consumption of fresh fruit, vegetables, and low fat dairy, alcohol moderation, and smoking cessation.;Monitor prescription use compliance.    Expected Outcomes Long Term: Maintenance of blood pressure at goal levels.;Short Term: Continued assessment and intervention until BP is < 140/70mm HG in hypertensive participants. < 130/55mm HG in hypertensive participants with diabetes, heart failure or chronic kidney disease.    Lipids Yes    Intervention Provide education and support for participant on nutrition & aerobic/resistive exercise along with prescribed medications to achieve LDL 70mg , HDL >40mg .    Expected Outcomes Long Term: Cholesterol controlled with medications as prescribed, with individualized exercise RX and with personalized nutrition plan. Value goals: LDL < 70mg , HDL > 40 mg.;Short Term: Participant states understanding of desired  cholesterol values and is compliant with medications prescribed. Participant is following exercise prescription and nutrition guidelines.             Core Components/Risk Factors/Patient Goals Review:   Goals and Risk Factor Review     Row Name 07/12/23 1326             Core Components/Risk Factors/Patient Goals Review   Personal Goals Review Weight Management/Obesity;Hypertension;Lipids       Review Christopher Lara is doing very well with exercise at cardiac rehab. Christopher Lara has increased his met levels and has lost 4.0 kg since starting the program.       Expected Outcomes Christopher Lara will continue to participate in cardiac rehab for exercise, nutrtion and lifestyle modifications                Core Components/Risk Factors/Patient Goals at Discharge (Final Review):    Goals and Risk Factor Review - 07/12/23 1326       Core Components/Risk Factors/Patient Goals Review   Personal Goals Review Weight Management/Obesity;Hypertension;Lipids    Review Christopher Lara is doing very well with exercise at cardiac rehab. Christopher Lara has increased his met levels and has lost 4.0 kg since starting the program.    Expected Outcomes Christopher Lara will continue to participate in cardiac rehab for exercise, nutrtion and lifestyle modifications             ITP Comments:  ITP Comments     Row Name 06/20/23 1059 07/12/23 1325         ITP Comments Dr. Gaylyn Keas medical director. Introduction to pritikin education/intensive cardiac rehab. Initial orientation packet reviewed with patient. 30 Day ITP Review. Christopher Lara has has good attendance and participation with exercise at cardiac rehab.               Comments: See ITP comment

## 2023-07-17 ENCOUNTER — Encounter (HOSPITAL_COMMUNITY)
Admission: RE | Admit: 2023-07-17 | Discharge: 2023-07-17 | Disposition: A | Discharge status: Home / Self Care | Source: Ambulatory Visit | Attending: Cardiology | Admitting: Cardiology

## 2023-07-17 DIAGNOSIS — Z955 Presence of coronary angioplasty implant and graft: Secondary | ICD-10-CM | POA: Diagnosis not present

## 2023-07-17 DIAGNOSIS — I214 Non-ST elevation (NSTEMI) myocardial infarction: Secondary | ICD-10-CM | POA: Diagnosis not present

## 2023-07-19 ENCOUNTER — Encounter (HOSPITAL_COMMUNITY)
Admission: RE | Admit: 2023-07-19 | Discharge: 2023-07-19 | Disposition: A | Discharge status: Home / Self Care | Source: Ambulatory Visit | Attending: Cardiology | Admitting: Cardiology

## 2023-07-19 DIAGNOSIS — Z955 Presence of coronary angioplasty implant and graft: Secondary | ICD-10-CM

## 2023-07-19 DIAGNOSIS — I214 Non-ST elevation (NSTEMI) myocardial infarction: Secondary | ICD-10-CM

## 2023-07-24 ENCOUNTER — Encounter (HOSPITAL_COMMUNITY)
Admission: RE | Admit: 2023-07-24 | Discharge: 2023-07-24 | Disposition: A | Discharge status: Home / Self Care | Source: Ambulatory Visit | Attending: Cardiology

## 2023-07-24 DIAGNOSIS — I214 Non-ST elevation (NSTEMI) myocardial infarction: Secondary | ICD-10-CM | POA: Diagnosis not present

## 2023-07-24 DIAGNOSIS — Z955 Presence of coronary angioplasty implant and graft: Secondary | ICD-10-CM | POA: Diagnosis not present

## 2023-07-26 ENCOUNTER — Encounter (HOSPITAL_COMMUNITY)
Admission: RE | Admit: 2023-07-26 | Discharge: 2023-07-26 | Disposition: A | Discharge status: Home / Self Care | Source: Ambulatory Visit | Attending: Cardiology | Admitting: Cardiology

## 2023-07-26 DIAGNOSIS — Z955 Presence of coronary angioplasty implant and graft: Secondary | ICD-10-CM | POA: Diagnosis not present

## 2023-07-26 DIAGNOSIS — I214 Non-ST elevation (NSTEMI) myocardial infarction: Secondary | ICD-10-CM

## 2023-07-29 ENCOUNTER — Encounter (HOSPITAL_COMMUNITY)
Admission: RE | Admit: 2023-07-29 | Discharge: 2023-07-29 | Disposition: A | Discharge status: Home / Self Care | Source: Ambulatory Visit | Attending: Cardiology | Admitting: Cardiology

## 2023-07-29 DIAGNOSIS — Z955 Presence of coronary angioplasty implant and graft: Secondary | ICD-10-CM | POA: Insufficient documentation

## 2023-07-29 DIAGNOSIS — I252 Old myocardial infarction: Secondary | ICD-10-CM | POA: Diagnosis not present

## 2023-07-29 DIAGNOSIS — I214 Non-ST elevation (NSTEMI) myocardial infarction: Secondary | ICD-10-CM

## 2023-07-31 ENCOUNTER — Encounter (HOSPITAL_COMMUNITY)
Admission: RE | Admit: 2023-07-31 | Discharge: 2023-07-31 | Disposition: A | Discharge status: Home / Self Care | Source: Ambulatory Visit | Attending: Cardiology | Admitting: Cardiology

## 2023-07-31 DIAGNOSIS — Z955 Presence of coronary angioplasty implant and graft: Secondary | ICD-10-CM | POA: Diagnosis not present

## 2023-07-31 DIAGNOSIS — I214 Non-ST elevation (NSTEMI) myocardial infarction: Secondary | ICD-10-CM

## 2023-07-31 DIAGNOSIS — I252 Old myocardial infarction: Secondary | ICD-10-CM | POA: Diagnosis not present

## 2023-08-02 ENCOUNTER — Encounter (HOSPITAL_COMMUNITY)
Admission: RE | Admit: 2023-08-02 | Discharge: 2023-08-02 | Disposition: A | Discharge status: Home / Self Care | Source: Ambulatory Visit | Attending: Cardiology | Admitting: Cardiology

## 2023-08-02 DIAGNOSIS — I214 Non-ST elevation (NSTEMI) myocardial infarction: Secondary | ICD-10-CM | POA: Diagnosis not present

## 2023-08-02 DIAGNOSIS — Z955 Presence of coronary angioplasty implant and graft: Secondary | ICD-10-CM | POA: Diagnosis not present

## 2023-08-02 DIAGNOSIS — I252 Old myocardial infarction: Secondary | ICD-10-CM | POA: Diagnosis not present

## 2023-08-05 ENCOUNTER — Encounter (HOSPITAL_COMMUNITY)
Admission: RE | Admit: 2023-08-05 | Discharge: 2023-08-05 | Disposition: A | Discharge status: Home / Self Care | Source: Ambulatory Visit | Attending: Cardiology | Admitting: Cardiology

## 2023-08-05 DIAGNOSIS — I214 Non-ST elevation (NSTEMI) myocardial infarction: Secondary | ICD-10-CM

## 2023-08-05 DIAGNOSIS — Z955 Presence of coronary angioplasty implant and graft: Secondary | ICD-10-CM

## 2023-08-05 DIAGNOSIS — I252 Old myocardial infarction: Secondary | ICD-10-CM | POA: Diagnosis not present

## 2023-08-07 ENCOUNTER — Encounter (HOSPITAL_COMMUNITY)
Admission: RE | Admit: 2023-08-07 | Discharge: 2023-08-07 | Disposition: A | Discharge status: Home / Self Care | Source: Ambulatory Visit | Attending: Cardiology | Admitting: Cardiology

## 2023-08-07 DIAGNOSIS — I214 Non-ST elevation (NSTEMI) myocardial infarction: Secondary | ICD-10-CM

## 2023-08-07 DIAGNOSIS — I252 Old myocardial infarction: Secondary | ICD-10-CM | POA: Diagnosis not present

## 2023-08-07 DIAGNOSIS — Z955 Presence of coronary angioplasty implant and graft: Secondary | ICD-10-CM

## 2023-08-09 ENCOUNTER — Encounter (HOSPITAL_COMMUNITY)
Admission: RE | Admit: 2023-08-09 | Discharge: 2023-08-09 | Disposition: A | Discharge status: Home / Self Care | Source: Ambulatory Visit | Attending: Cardiology | Admitting: Cardiology

## 2023-08-09 DIAGNOSIS — Z955 Presence of coronary angioplasty implant and graft: Secondary | ICD-10-CM

## 2023-08-09 DIAGNOSIS — I214 Non-ST elevation (NSTEMI) myocardial infarction: Secondary | ICD-10-CM

## 2023-08-09 DIAGNOSIS — I252 Old myocardial infarction: Secondary | ICD-10-CM | POA: Diagnosis not present

## 2023-08-12 ENCOUNTER — Encounter (HOSPITAL_COMMUNITY)
Admission: RE | Admit: 2023-08-12 | Discharge: 2023-08-12 | Disposition: A | Discharge status: Home / Self Care | Source: Ambulatory Visit | Attending: Cardiology | Admitting: Cardiology

## 2023-08-12 DIAGNOSIS — Z955 Presence of coronary angioplasty implant and graft: Secondary | ICD-10-CM | POA: Diagnosis not present

## 2023-08-12 DIAGNOSIS — I214 Non-ST elevation (NSTEMI) myocardial infarction: Secondary | ICD-10-CM | POA: Diagnosis not present

## 2023-08-12 DIAGNOSIS — I252 Old myocardial infarction: Secondary | ICD-10-CM | POA: Diagnosis not present

## 2023-08-12 NOTE — Progress Notes (Signed)
 Cardiac Individual Treatment Plan  Patient Details  Name: Christopher Lara MRN: [id number] Date of Birth: 10-27-1975 Referring Provider:   Flowsheet Row INTENSIVE CARDIAC REHAB ORIENT from 06/20/2023 in [facility]  Referring Provider Dr. [place] Peter    Initial Encounter Date:  Flowsheet Row INTENSIVE CARDIAC REHAB ORIENT from 06/20/2023 in [facility]  Date 06/20/23    Visit Diagnosis: 06/04/23 NSTEMI (non-ST elevated myocardial infarction) (HCC)  06/04/23 Status post coronary artery stent placement  Patient's Home Medications on Admission:  Current Outpatient Medications:    acetaminophen  (TYLENOL ) 650 MG CR tablet, Take 650 mg by mouth every 8 (eight) hours as needed for pain., Disp: , Rfl:    aspirin  EC 81 MG tablet, Take 1 tablet (81 mg total) by mouth daily. Swallow whole., Disp: 90 tablet, Rfl: 3   ergocalciferol (VITAMIN D2) 1.25 MG (50000 UT) capsule, Take 50,000 Units by mouth once a week. On Sundays, Disp: , Rfl:    hydrocortisone  (CORTEF ) 5 MG tablet, Take 15 mg by mouth in the morning., Disp: , Rfl:    levothyroxine  (SYNTHROID ) 100 MCG tablet, Take 100 mcg by mouth every morning. (Patient not taking: Reported on 06/17/2023), Disp: , Rfl:    levothyroxine  (SYNTHROID ) 112 MCG tablet, 1 tablet in the morning on an empty stomach Orally Once a day for 30 days STOP 100 MCG TABLETS, Disp: , Rfl:    losartan  (COZAAR ) 25 MG tablet, Take 0.5 tablets (12.5 mg total) by mouth daily., Disp: 15 tablet, Rfl: 11   nitroGLYCERIN  (NITROSTAT ) 0.4 MG SL tablet, Place 1 tablet (0.4 mg total) under the tongue every 5 (five) minutes x 3 doses as needed for chest pain., Disp: 25 tablet, Rfl: 3   prasugrel  (EFFIENT ) 10 MG TABS tablet, Take 1 tablet (10 mg total) by mouth daily., Disp: 90 tablet, Rfl: 3  Past Medical History: Past Medical History:  Diagnosis Date   Hypercalcemia     Hyperparathyroidism (HCC)    Hyponatremia    Hypothyroidism    Kidney stone    Panhypopituitarism (HCC)    Pericardial effusion    Pneumonia due to COVID-19 virus     Tobacco Use: Social History   Tobacco Use  Smoking Status Never   Passive exposure: Never  Smokeless Tobacco Never    Labs: Review Flowsheet       Latest Ref Rng & Units 06/05/2023  Labs for ITP Cardiac and Pulmonary Rehab  Cholestrol 0 - 200 mg/dL 657   LDL (calc) 0 - 99 mg/dL 846   HDL-C >96 mg/dL 35   Trlycerides <295 mg/dL 284   Hemoglobin X3K 4.8 - 5.6 % 4.8     Capillary Blood Glucose: Lab Results  Component Value Date   GLUCAP 80 02/22/2021   GLUCAP 58 (L) 02/22/2021   GLUCAP 74 03/18/2019   GLUCAP 65 (L) 03/18/2019   GLUCAP 90 03/15/2019     Exercise Target Goals: Exercise Program Goal: Individual exercise prescription set using results from initial 6 min walk test and THRR while considering  patient's activity barriers and safety.   Exercise Prescription Goal: Initial exercise prescription builds to 30-45 minutes a day of aerobic activity, 2-3 days per week.  Home exercise guidelines will be given to patient during program as part of exercise prescription that the participant will acknowledge.  Activity Barriers & Risk Stratification:  Activity Barriers & Cardiac Risk Stratification - 06/20/23 1101  Activity Barriers & Cardiac Risk Stratification   Activity Barriers None    Cardiac Risk Stratification Moderate          6 Minute Walk:  6 Minute Walk     Row Name 06/20/23 1320         6 Minute Walk   Phase Initial     Distance 1440 feet     Walk Time 6 minutes     # of Rest Breaks 0     MPH 2.72     METS 4.5     RPE 8     Perceived Dyspnea  0     VO2 Peak 15.83     Symptoms No     Resting HR 70 bpm     Resting BP 112/68     Resting Oxygen Saturation  100 %     Max Ex. HR 118 bpm     Max Ex. BP 122/70     2 Minute Post BP 120/76        Oxygen Initial  Assessment:   Oxygen Re-Evaluation:   Oxygen Discharge (Final Oxygen Re-Evaluation):   Initial Exercise Prescription:  Initial Exercise Prescription - 06/20/23 1300       Date of Initial Exercise RX and Referring Provider   Date 06/20/23    Referring Provider Dr. [place] Peter    Expected Discharge Date 09/11/23      Bike   Level 2    Watts 26    Minutes 15    METs 2.4      Elliptical   Level 1    Speed 1    Minutes 15    METs 3.4      Prescription Details   Frequency (times per week) 3 days/week    Duration Progress to 30 minutes of continuous aerobic without signs/symptoms of physical distress      Intensity   THRR 40-80% of Max Heartrate 68-137    Ratings of Perceived Exertion 11-13      Progression   Progression Continue to progress workloads to maintain intensity without signs/symptoms of physical distress.      Resistance Training   Training Prescription Yes    Weight 4    Reps 10-15          Perform Capillary Blood Glucose checks as needed.  Exercise Prescription Changes:   Exercise Prescription Changes     Row Name 06/24/23 0821 07/10/23 0800 07/24/23 0856 08/07/23 0830       Response to Exercise   Blood Pressure (Admit) 128/80 120/62 124/76 118/62    Blood Pressure (Exercise) 144/82 -- -- --    Blood Pressure (Exit) 110/66 104/68 110/76 110/64    Heart Rate (Admit) 84 bpm 78 bpm 76 bpm 68 bpm    Heart Rate (Exercise) 144 bpm 124 bpm 127 bpm 138 bpm    Heart Rate (Exit) 97 bpm 87 bpm 79 bpm 80 bpm    Rating of Perceived Exertion (Exercise) 10 10 7.5 7.5    Perceived Dyspnea (Exercise) 0 0 0 0    Symptoms none 0 0 0    Comments Pt first day in the [facility] iCR program Reviewed MET's, goals and home ExRx Reviewed MET's Reviewed MET's and goals    Duration Progress to 30 minutes of  aerobic without signs/symptoms of physical distress Progress to 30 minutes of  aerobic without signs/symptoms of physical distress Progress to 30 minutes of   aerobic without signs/symptoms of physical distress Progress to  30 minutes of  aerobic without signs/symptoms of physical distress    Intensity THRR unchanged THRR unchanged THRR unchanged THRR unchanged      Progression   Progression Continue to progress workloads to maintain intensity without signs/symptoms of physical distress. Continue to progress workloads to maintain intensity without signs/symptoms of physical distress. Continue to progress workloads to maintain intensity without signs/symptoms of physical distress. Continue to progress workloads to maintain intensity without signs/symptoms of physical distress.    Average METs 4.63 4.9 5.8 6.7      Resistance Training   Training Prescription Yes No No No    Weight 4 4 4 6     Reps 10-15 10-15 10-15 10-15    Time 10 Minutes 10 Minutes 10 Minutes 10 Minutes      Bike   Level 2 2.5 3 4     Watts 44 38 50 57    Minutes 15 15 15  73    METs 4.6 4.8 5.5 6.7      Elliptical   Level 1 1 2 3     Speed 1 3 3 3     Minutes 15 15 15 15     METs 3.6  Moved up level 1/3 AVG MET's 5.7. Just over THRR, DEC wl for next visit 5 6.1 6.7      Home Exercise Plan   Plans to continue exercise at -- Home (comment) Home (comment) Home (comment)    Frequency -- Add 2 additional days to program exercise sessions. Add 2 additional days to program exercise sessions. Add 2 additional days to program exercise sessions.    Initial Home Exercises Provided -- 07/10/23 07/10/23 07/10/23       Exercise Comments:   Exercise Comments     Row Name 06/20/23 1102 06/24/23 0829 07/10/23 0838 07/24/23 0858 08/07/23 0830   Exercise Comments Pt orientation. Pt maintain SR during . Pt asymptomatic. Pt first day in teh [facility] ICR program. Pt tolerated exercise well with an average MET level of 4.63. Pt is off to a good start, attempted to INC WL but over THRR, will work with pt to get intensities in THRR. Reviewed MET's, goals and home ExRx. Pt tolerated exercise well  with an average MET level of 4.9. He is doing well and progressing MET's. He is already feeling an increase in strength and stamina and is increasing his nutrition knowledge. Another goal was to get into a regular exercise routine, he's been doing great here, vitals good so he is planning on adding some days in at home by biking, walking and using some light hand weights. He plans to add in 1-2 days for 30-45 mins a session Reviewed MET's. Pt tolerated exercise well with an average MET level of 5.8. He is doing well and progressing MET's. We're working together to increase intensities and stay within [employer]. He is feeling good and increasing stamina Reviewed MET's and goals. Pt tolerated exercise well with an average MET level of 6.7. He is doing well and progressing MET's. Working together to [employer] as tolerated by [employer]. He has a fitness tracker at home now and is doing more bike riding and some handweights at home. He's feeling good and is increasing strength      Exercise Goals and Review:   Exercise Goals     Row Name 06/20/23 1101             Exercise Goals   Increase Physical Activity Yes       Intervention Provide advice,  education, support and counseling about physical activity/exercise needs.;Develop an individualized exercise prescription for aerobic and resistive training based on initial evaluation findings, risk stratification, comorbidities and participant's personal goals.       Expected Outcomes Short Term: Attend rehab on a regular basis to increase amount of physical activity.;Long Term: Exercising regularly at least 3-5 days a week.;Long Term: Add in home exercise to make exercise part of routine and to increase amount of physical activity.       Increase Strength and Stamina Yes       Intervention Provide advice, education, support and counseling about physical activity/exercise needs.;Develop an individualized exercise prescription for aerobic and resistive training based  on initial evaluation findings, risk stratification, comorbidities and participant's personal goals.       Expected Outcomes Short Term: Increase workloads from initial exercise prescription for resistance, speed, and METs.;Short Term: Perform resistance training exercises routinely during rehab and add in resistance training at home;Long Term: Improve cardiorespiratory fitness, muscular endurance and strength as measured by increased METs and functional capacity ( )       Able to understand and use rate of perceived exertion (RPE) scale Yes       Intervention Provide education and explanation on how to use RPE scale       Expected Outcomes Short Term: Able to use RPE daily in rehab to express subjective intensity level;Long Term:  Able to use RPE to guide intensity level when exercising independently       Knowledge and understanding of Target Heart Rate Range (THRR) Yes       Intervention Provide education and explanation of THRR including how the numbers were predicted and where they are located for reference       Expected Outcomes Short Term: Able to state/look up THRR;Long Term: Able to use THRR to govern intensity when exercising independently;Short Term: Able to use daily as guideline for intensity in rehab       Understanding of Exercise Prescription Yes       Intervention Provide education, explanation, and written materials on patient's individual exercise prescription       Expected Outcomes Short Term: Able to explain program exercise prescription;Long Term: Able to explain home exercise prescription to exercise independently          Exercise Goals Re-Evaluation :  Exercise Goals Re-Evaluation     Row Name 06/24/23 0827 07/10/23 0835 08/07/23 0830         Exercise Goal Re-Evaluation   Exercise Goals Review Increase Physical Activity;Understanding of Exercise Prescription;Increase Strength and Stamina;Knowledge and understanding of Target Heart Rate Range (THRR);Able to  understand and use rate of perceived exertion (RPE) scale Increase Physical Activity;Understanding of Exercise Prescription;Increase Strength and Stamina;Knowledge and understanding of Target Heart Rate Range (THRR);Able to understand and use rate of perceived exertion (RPE) scale Increase Physical Activity;Understanding of Exercise Prescription;Increase Strength and Stamina;Knowledge and understanding of Target Heart Rate Range (THRR);Able to understand and use rate of perceived exertion (RPE) scale     Comments Pt first day in teh [facility]ik[facility] ICR program. Pt tolerated exercise well with an average MET level of 4.63. Pt is off to a good start, attempted to INC WL but over THRR, will work with pt to get intensities in THRR. Reviewed MET's, goals and home ExRx. Pt tolerated exercise well with an average MET level of 4.9. He is doing well and progressing MET's. He is already feeling an increase in strength and stamina and is increasing his nutrition knowledge. Another  goal was to get into a regular exercise routine, he's been doing great here, vitals good so he is planning on adding some days in at home by biking, walking and using some light hand weights. He plans to add in 1-2 days for 30-45 mins a session Reviewed MET's and goals. Pt tolerated exercise well with an average MET level of 6.7. He is doing well and progressing MET's. Working together to [employer] as tolerated by [employer]. He has a fitness tracker at home now and is doing more bike riding and some handweights at home. He's feeling good and is increasing strength     Expected Outcomes Will continue to monitor pt and progress workloads as tolerated without sign or symptom Will continue to monitor pt and progress workloads as tolerated without sign or symptom Will continue to monitor pt and progress workloads as tolerated without sign or symptom        Discharge Exercise Prescription (Final Exercise Prescription Changes):  Exercise Prescription Changes  - 08/07/23 0830       Response to Exercise   Blood Pressure (Admit) 118/62    Blood Pressure (Exit) 110/64    Heart Rate (Admit) 68 bpm    Heart Rate (Exercise) 138 bpm    Heart Rate (Exit) 80 bpm    Rating of Perceived Exertion (Exercise) 7.5    Perceived Dyspnea (Exercise) 0    Symptoms 0    Comments Reviewed MET's and goals    Duration Progress to 30 minutes of  aerobic without signs/symptoms of physical distress    Intensity THRR unchanged      Progression   Progression Continue to progress workloads to maintain intensity without signs/symptoms of physical distress.    Average METs 6.7      Resistance Training   Training Prescription No    Weight 6    Reps 10-15    Time 10 Minutes      Bike   Level 4    Watts 57    Minutes 73    METs 6.7      Elliptical   Level 3    Speed 3    Minutes 15    METs 6.7      Home Exercise Plan   Plans to continue exercise at Home (comment)    Frequency Add 2 additional days to program exercise sessions.    Initial Home Exercises Provided 07/10/23          Nutrition:  Target Goals: Understanding of nutrition guidelines, daily intake of sodium 1500mg , cholesterol 200mg , calories 30% from fat and 7% or less from saturated fats, daily to have 5 or more servings of fruits and vegetables.  Biometrics:  Pre Biometrics - 06/20/23 1100       Pre Biometrics   Waist Circumference 37 inches    Hip Circumference 39.5 inches    Waist to Hip Ratio 0.94 %    Triceps Skinfold 10 mm    % Body Fat 23.1 %    Grip Strength 32 kg    Flexibility 12.5 in    Single Leg Stand 15.68 seconds           Nutrition Therapy Plan and Nutrition Goals:  Nutrition Therapy & Goals - 07/26/23 1156       Nutrition Therapy   Diet Heart healthy diet      Personal Nutrition Goals   Nutrition Goal Patient to identify strategies for reducing cardiovascular risk by attending the [facility] education and nutrition series  weekly.   Goal in progress.    Personal Goal #2 Patient to improve diet quality by using the plate method as a guide for meal planning to include lean protein/plant protein, fruits, vegetables, whole grains, nonfat dairy as part of a well-balanced diet.   goal in progress   Comments Goals in progress. Christopher Lara has medical history of NSTEMI, CAD, hyperlipidemia.  In addition, he has medical history of panhypopituitarism, hypothyroidism, hyperparathyroidism, hypercalcemia managed by [facility] endocrinology. He is unable to utilize statin therapy due to chronically elevated CK; referred to [facility] for consideration of PCSK9i, LDL goal <55 and lipids are not currently at goal. He continues to attend the [facility] education and nutrition series regularly. He is down 8.8# since starting with our program. Patient will benefit from participation in intensive cardiac rehab for nutrition, exercise, and lifestyle modification.      Intervention Plan   Intervention Prescribe, educate and counsel regarding individualized specific dietary modifications aiming towards targeted core components such as weight, hypertension, lipid management, diabetes, heart failure and other comorbidities.;Nutrition handout(s) given to patient.    Expected Outcomes Short Term Goal: Understand basic principles of dietary content, such as calories, fat, sodium, cholesterol and nutrients.;Long Term Goal: Adherence to prescribed nutrition plan.          Nutrition Assessments:  Nutrition Assessments - 06/24/23 0840       Rate Your Plate Scores   Pre Score 75         MEDIFICTS Score Key: >=70 Need to make dietary changes  40-70 Heart Healthy Diet <= 40 Therapeutic Level Cholesterol Diet   Flowsheet Row INTENSIVE CARDIAC REHAB from 06/24/2023 in [facility]  Picture Your Plate Total Score on Admission 75   Picture Your Plate Scores: <16 Unhealthy dietary pattern with much room for  improvement. 41-50 Dietary pattern unlikely to meet recommendations for good health and room for improvement. 51-60 More healthful dietary pattern, with some room for improvement.  >60 Healthy dietary pattern, although there may be some specific behaviors that could be improved.    Nutrition Goals Re-Evaluation:  Nutrition Goals Re-Evaluation     Row Name 06/24/23 0836 07/26/23 1156           Goals   Current Weight 163 lb 2.3 oz (74 kg) 158 lb 15.2 oz (72.1 kg)      Comment HDL 35, LDL 117, Lpa 72, A1c WNL no new labs; most recent labs HDL 35, LDL 117, Lpa 72, A1c WNL      Expected Outcome Christopher Lara has medical history of NSTEMI, CAD, hyperlipidemia. In addition, he has medical history of panhypopituitarism, hypothyroidism, hyperparathyroidism, hypercalcemia managed by [facility] endocrinology. He is unable to utilize statin therapy due to chronically elevated CK; referred to [facility] for consideration of PCSK9i, LDL goal <55 and lipids are not currently at goal. Patient will benefit from participation in intensive cardiac rehab for nutrition, exercise, and lifestyle modification. Goals in progress. Christopher Lara has medical history of NSTEMI, CAD, hyperlipidemia. In addition, he has medical history of panhypopituitarism, hypothyroidism, hyperparathyroidism, hypercalcemia managed by [facility] endocrinology. He is unable to utilize statin therapy due to chronically elevated CK; referred to [facility] clinic for consideration of PCSK9i, LDL goal <55 and lipids are not currently at goal. He continues to attend the [facility] education and nutrition series regularly. He is down 8.8# since starting with our program. Patient will benefit from participation in intensive cardiac rehab for nutrition,  exercise, and lifestyle modification.         Nutrition Goals Re-Evaluation:  Nutrition Goals Re-Evaluation     Row Name 06/24/23 0836 07/26/23 1156           Goals   Current Weight 163 lb 2.3 oz (74 kg) 158 lb  15.2 oz (72.1 kg)      Comment HDL 35, LDL 117, Lpa 72, A1c WNL no new labs; most recent labs HDL 35, LDL 117, Lpa 72, A1c WNL      Expected Outcome Christopher Lara has medical history of NSTEMI, CAD, hyperlipidemia. In addition, he has medical history of panhypopituitarism, hypothyroidism, hyperparathyroidism, hypercalcemia managed by [facility] endocrinology. He is unable to utilize statin therapy due to chronically elevated CK; referred to [facility] clinic for consideration of PCSK9i, LDL goal <55 and lipids are not currently at goal. Patient will benefit from participation in intensive cardiac rehab for nutrition, exercise, and lifestyle modification. Goals in progress. Christopher Lara has medical history of NSTEMI, CAD, hyperlipidemia. In addition, he has medical history of panhypopituitarism, hypothyroidism, hyperparathyroidism, hypercalcemia managed by [facility] endocrinology. He is unable to utilize statin therapy due to chronically elevated CK; referred to [facility] clinic for consideration of PCSK9i, LDL goal <55 and lipids are not currently at goal. He continues to attend the [facility] education and nutrition series regularly. He is down 8.8# since starting with our program. Patient will benefit from participation in intensive cardiac rehab for nutrition, exercise, and lifestyle modification.         Nutrition Goals Discharge (Final Nutrition Goals Re-Evaluation):  Nutrition Goals Re-Evaluation - 07/26/23 1156       Goals   Current Weight 158 lb 15.2 oz (72.1 kg)    Comment no new labs; most recent labs HDL 35, LDL 117, Lpa 72, A1c WNL    Expected Outcome Goals in progress. Christopher Lara has medical history of NSTEMI, CAD, hyperlipidemia. In addition, he has medical history of panhypopituitarism, hypothyroidism, hyperparathyroidism, hypercalcemia managed by [facility] endocrinology. He is unable to utilize statin therapy due to chronically elevated CK; referred to [facility] clinic for consideration of PCSK9i, LDL goal <55 and  lipids are not currently at goal. He continues to attend the [facility] education and nutrition series regularly. He is down 8.8# since starting with our program. Patient will benefit from participation in intensive cardiac rehab for nutrition, exercise, and lifestyle modification.          Psychosocial: Target Goals: Acknowledge presence or absence of significant depression and/or stress, maximize coping skills, provide positive support system. Participant is able to verbalize types and ability to use techniques and skills needed for reducing stress and depression.  Initial Review & Psychosocial Screening:  Initial Psych Review & Screening - 06/27/23 1513       Initial Review   Current issues with None Identified      Family Dynamics   Good Support System? Yes      Barriers   Psychosocial barriers to participate in program There are no identifiable barriers or psychosocial needs.      Screening Interventions   Interventions Encouraged to exercise;To provide support and resources with identified psychosocial needs;Provide feedback about the scores to participant    Expected Outcomes Short Term goal: Utilizing psychosocial counselor, staff and physician to assist with identification of specific Stressors or current issues interfering with healing process. Setting desired goal for each stressor or current issue identified.;Long Term Goal: Stressors or current issues are controlled or eliminated.;Short Term goal: Identification and review  with participant of any Quality of Life or Depression concerns found by scoring the questionnaire.;Long Term goal: The participant improves quality of Life and PHQ9 Scores as seen by post scores and/or verbalization of changes          Quality of Life Scores:  Quality of Life - 06/20/23 1342       Quality of Life   Select Quality of Life      Quality of Life Scores   Health/Function Pre 23.43 %    Socioeconomic Pre 26.43 %    Psych/Spiritual Pre  24.86 %    Family Pre 27 %    GLOBAL Pre 24.8 %         Scores of 19 and below usually indicate a poorer quality of life in these areas.  A difference of  2-3 points is a clinically meaningful difference.  A difference of 2-3 points in the total score of the Quality of Life Index has been associated with significant improvement in overall quality of life, self-image, physical symptoms, and general health in studies assessing change in quality of life.  PHQ-9: Review Flowsheet       06/20/2023 05/16/2022 04/03/2021 03/26/2019  Depression screen PHQ 2/9  Decreased Interest 1 0 0 0  Down, Depressed, Hopeless 0 0 0 0  PHQ - 2 Score 1 0 0 0  Altered sleeping 0 - 0 -  Tired, decreased energy 1 - 0 -  Change in appetite 0 - 0 -  Feeling bad or failure about yourself  0 - 0 -  Trouble concentrating 0 - 0 -  Moving slowly or fidgety/restless 0 - 0 -  Suicidal thoughts 0 - 0 -  PHQ-9 Score 2 - 0 -  Difficult doing work/chores Somewhat difficult - - -   Interpretation of Total Score  Total Score Depression Severity:  1-4 = Minimal depression, 5-9 = Mild depression, 10-14 = Moderate depression, 15-19 = Moderately severe depression, 20-27 = Severe depression   Psychosocial Evaluation and Intervention:   Psychosocial Re-Evaluation:  Psychosocial Re-Evaluation     Row Name 07/12/23 1326 08/08/23 5784           Psychosocial Re-Evaluation   Current issues with None Identified None Identified      Interventions Encouraged to attend Cardiac Rehabilitation for the exercise Encouraged to attend Cardiac Rehabilitation for the exercise      Continue Psychosocial Services  No Follow up required No Follow up required         Psychosocial Discharge (Final Psychosocial Re-Evaluation):  Psychosocial Re-Evaluation - 08/08/23 6962       Psychosocial Re-Evaluation   Current issues with None Identified    Interventions Encouraged to attend Cardiac Rehabilitation for the exercise    Continue  Psychosocial Services  No Follow up required          Vocational Rehabilitation: Provide vocational rehab assistance to qualifying candidates.   Vocational Rehab Evaluation & Intervention:  Vocational Rehab - 06/20/23 1113       Initial Vocational Rehab Evaluation & Intervention   Assessment shows need for Vocational Rehabilitation No   Currently working         Education: Education Goals: Education classes will be provided on a weekly basis, covering required topics. Participant will state understanding/return demonstration of topics presented.    Education     Row Name 06/24/23 0800     Education   Cardiac Education Topics Pritikin   [employer]  Educator Exercise Clinical cytogeneticist Psychosocial   Psychosocial Workshop From Head to Heart: The Power of a Healthy Outlook   Instruction Review Code 1- Verbalizes Understanding   Class Start Time 0815   Class Stop Time 0900   Class Time Calculation (min) 45 min    Row Name 06/26/23 0900     Education   Cardiac Education Topics Pritikin   Secondary school teacher School   Educator Dietitian   Weekly Topic Tasty Appetizers and Snacks   Instruction Review Code 1- Verbalizes Understanding   Class Start Time 0815   Class Stop Time 0855   Class Time Calculation (min) 40 min    Row Name 06/28/23 0800     Education   Cardiac Education Topics Pritikin   Psychologist, forensic General Education   General Education Heart Disease Risk Reduction   Instruction Review Code 1- Verbalizes Understanding   Class Start Time 0813   Class Stop Time 0850   Class Time Calculation (min) 37 min    Row Name 07/01/23 0900     Education   Cardiac Education Topics Pritikin   Select Core Videos     Core Videos   Educator Exercise Physiologist   Select Psychosocial   Psychosocial Healthy Minds, Bodies, Hearts   Instruction Review Code 1-  Verbalizes Understanding   Class Start Time 0815   Class Stop Time 0850   Class Time Calculation (min) 35 min    Row Name 07/05/23 0900     Education   Cardiac Education Topics Pritikin   Select Workshops     Workshops   Educator Exercise Physiologist   Select Exercise   Exercise Workshop Location manager and Fall Prevention   Instruction Review Code 1- Verbalizes Understanding   Class Start Time 0810   Class Stop Time 0850   Class Time Calculation (min) 40 min    Row Name 07/08/23 0900     Education   Cardiac Education Topics Pritikin   Glass blower/designer Nutrition   Nutrition Workshop Label Reading   Instruction Review Code 1- Verbalizes Understanding   Class Start Time 0815   Class Stop Time 0900   Class Time Calculation (min) 45 min    Row Name 07/10/23 0900     Education   Cardiac Education Topics Pritikin   Select Core Videos     Core Videos   Educator Exercise Physiologist   Select Nutrition   Nutrition Other  Label reading   Instruction Review Code 1- Verbalizes Understanding   Class Start Time 0813   Class Stop Time 0844   Class Time Calculation (min) 31 min    Row Name 07/15/23 0700     Education   Cardiac Education Topics Pritikin   Hospital doctor Education   General Education Metabolic Syndrome and Belly Fat   Instruction Review Code 1- Verbalizes Understanding   Class Start Time 0815   Class Stop Time 0851   Class Time Calculation (min) 36 min    Row Name 07/17/23 1000     Education   Cardiac Education Topics Pritikin     Secondary school teacher   Weekly Topic Personalizing Your Pritikin Plate   Instruction Review Code 1-  Verbalizes Understanding   Class Start Time 0815   Class Stop Time 0850   Class Time Calculation (min) 35 min    Row Name 07/19/23 0800     Education   Cardiac Education Topics  Pritikin   Select Core Videos     Core Videos   Educator Dietitian   Select Nutrition   Nutrition Overview of the Pritikin Eating Plan   Instruction Review Code 1- Verbalizes Understanding   Class Start Time 0815   Class Stop Time 0900   Class Time Calculation (min) 45 min    Row Name 07/26/23 0800     Education   Cardiac Education Topics Pritikin   Select Core Videos     Core Videos   Educator Dietitian   Select Nutrition   Nutrition Calorie Density   Instruction Review Code 1- Verbalizes Understanding   Class Start Time 0815   Class Stop Time 0855   Class Time Calculation (min) 40 min    Row Name 07/29/23 0800     Education   Cardiac Education Topics Pritikin   Select Workshops     Workshops   Educator Exercise Physiologist   Select Exercise   Exercise Workshop Exercise Basics: Building Your Action Plan   Instruction Review Code 1- Verbalizes Understanding   Class Start Time 0810   Class Stop Time 0853   Class Time Calculation (min) 43 min    Row Name 08/02/23 0900     Education   Cardiac Education Topics Pritikin   Psychologist, forensic Exercise Education   Exercise Education Move It!   Instruction Review Code 1- Verbalizes Understanding   Class Start Time [phone]   Class Stop Time 0845   Class Time Calculation (min) 35 min      Core Videos: Exercise    Move It!  Clinical staff conducted group or individual video education with verbal and written material and guidebook.  Patient learns the recommended Pritikin exercise program. Exercise with the goal of living a long, healthy life. Some of the health benefits of exercise include controlled diabetes, healthier blood pressure levels, improved cholesterol levels, improved heart and lung capacity, improved sleep, and better body composition. Everyone should speak with their doctor before starting or changing an exercise routine.  Biomechanical  Limitations Clinical staff conducted group or individual video education with verbal and written material and guidebook.  Patient learns how biomechanical limitations can impact exercise and how we can mitigate and possibly overcome limitations to have an impactful and balanced exercise routine.  Body Composition Clinical staff conducted group or individual video education with verbal and written material and guidebook.  Patient learns that body composition (ratio of muscle mass to fat mass) is a key component to assessing overall fitness, rather than body weight alone. Increased fat mass, especially visceral belly fat, can put us  at increased risk for metabolic syndrome, type 2 diabetes, heart disease, and even death. It is recommended to combine diet and exercise (cardiovascular and resistance training) to improve your body composition. Seek guidance from your physician and exercise physiologist before implementing an exercise routine.  Exercise Action Plan Clinical staff conducted group or individual video education with verbal and written material and guidebook.  Patient learns the recommended strategies to achieve and enjoy long-term exercise adherence, including variety, self-motivation, self-efficacy, and positive decision making. Benefits of exercise include fitness, good health, weight management, more energy, better sleep, less stress,  and overall well-being.  Medical   Heart Disease Risk Reduction Clinical staff conducted group or individual video education with verbal and written material and guidebook.  Patient learns our heart is our most vital organ as it circulates oxygen, nutrients, white blood cells, and hormones throughout the entire body, and carries waste away. Data supports a plant-based eating plan like the Pritikin Program for its effectiveness in slowing progression of and reversing heart disease. The video provides a number of recommendations to address heart  disease.   Metabolic Syndrome and Belly Fat  Clinical staff conducted group or individual video education with verbal and written material and guidebook.  Patient learns what metabolic syndrome is, how it leads to heart disease, and how one can reverse it and keep it from coming back. You have metabolic syndrome if you have 3 of the following 5 criteria: abdominal obesity, high blood pressure, high triglycerides, low HDL cholesterol, and high blood sugar.  Hypertension and Heart Disease Clinical staff conducted group or individual video education with verbal and written material and guidebook.  Patient learns that high blood pressure, or hypertension, is very common in the [place] . Hypertension is largely due to excessive salt intake, but other important risk factors include being overweight, physical inactivity, drinking too much alcohol, smoking, and not eating enough potassium from fruits and vegetables. High blood pressure is a leading risk factor for heart attack, stroke, congestive heart failure, dementia, kidney failure, and premature death. Long-term effects of excessive salt intake include stiffening of the arteries and thickening of heart muscle and organ damage. Recommendations include ways to reduce hypertension and the risk of heart disease.  Diseases of Our Time - Focusing on Diabetes Clinical staff conducted group or individual video education with verbal and written material and guidebook.  Patient learns why the best way to stop diseases of our time is prevention, through food and other lifestyle changes. Medicine (such as prescription pills and surgeries) is often only a Band-Aid on the problem, not a long-term solution. Most common diseases of our time include obesity, type 2 diabetes, hypertension, heart disease, and cancer. The Pritikin Program is recommended and has been proven to help reduce, reverse, and/or prevent the damaging effects of metabolic syndrome.  Nutrition    Overview of the Pritikin Eating Plan  Clinical staff conducted group or individual video education with verbal and written material and guidebook.  Patient learns about the Pritikin Eating Plan for disease risk reduction. The Pritikin Eating Plan emphasizes a wide variety of unrefined, minimally-processed carbohydrates, like fruits, vegetables, whole grains, and legumes. Go, Caution, and Stop food choices are explained. Plant-based and lean animal proteins are emphasized. Rationale provided for low sodium intake for blood pressure control, low added sugars for blood sugar stabilization, and low added fats and oils for coronary artery disease risk reduction and weight management.  Calorie Density  Clinical staff conducted group or individual video education with verbal and written material and guidebook.  Patient learns about calorie density and how it impacts the Pritikin Eating Plan. Knowing the characteristics of the food you choose will help you decide whether those foods will lead to weight gain or weight loss, and whether you want to consume more or less of them. Weight loss is usually a side effect of the Pritikin Eating Plan because of its focus on low calorie-dense foods.  Label Reading  Clinical staff conducted group or individual video education with verbal and written material and guidebook.  Patient learns about the Pritikin  recommended label reading guidelines and corresponding recommendations regarding calorie density, added sugars, sodium content, and whole grains.  Dining Out - Part 1  Clinical staff conducted group or individual video education with verbal and written material and guidebook.  Patient learns that restaurant meals can be sabotaging because they can be so high in calories, fat, sodium, and/or sugar. Patient learns recommended strategies on how to positively address this and avoid unhealthy pitfalls.  Facts on Fats  Clinical staff conducted group or individual video  education with verbal and written material and guidebook.  Patient learns that lifestyle modifications can be just as effective, if not more so, as many medications for lowering your risk of heart disease. A Pritikin lifestyle can help to reduce your risk of inflammation and atherosclerosis (cholesterol build-up, or plaque, in the artery walls). Lifestyle interventions such as dietary choices and physical activity address the cause of atherosclerosis. A review of the types of fats and their impact on blood cholesterol levels, along with dietary recommendations to reduce fat intake is also included.  Nutrition Action Plan  Clinical staff conducted group or individual video education with verbal and written material and guidebook.  Patient learns how to incorporate Pritikin recommendations into their lifestyle. Recommendations include planning and keeping personal health goals in mind as an important part of their success.  Healthy Mind-Set    Healthy Minds, Bodies, Hearts  Clinical staff conducted group or individual video education with verbal and written material and guidebook.  Patient learns how to identify when they are stressed. Video will discuss the impact of that stress, as well as the many benefits of stress management. Patient will also be introduced to stress management techniques. The way we think, act, and feel has an impact on our hearts.  How Our Thoughts Can Heal Our Hearts  Clinical staff conducted group or individual video education with verbal and written material and guidebook.  Patient learns that negative thoughts can cause depression and anxiety. This can result in negative lifestyle behavior and serious health problems. Cognitive behavioral therapy is an effective method to help control our thoughts in order to change and improve our emotional outlook.  Additional Videos:  Exercise    Improving Performance  Clinical staff conducted group or individual video education with  verbal and written material and guidebook.  Patient learns to use a non-linear approach by alternating intensity levels and lengths of time spent exercising to help burn more calories and lose more body fat. Cardiovascular exercise helps improve heart health, metabolism, hormonal balance, blood sugar control, and recovery from fatigue. Resistance training improves strength, endurance, balance, coordination, reaction time, metabolism, and muscle mass. Flexibility exercise improves circulation, posture, and balance. Seek guidance from your physician and exercise physiologist before implementing an exercise routine and learn your capabilities and proper form for all exercise.  Introduction to Yoga  Clinical staff conducted group or individual video education with verbal and written material and guidebook.  Patient learns about yoga, a discipline of the coming together of mind, breath, and body. The benefits of yoga include improved flexibility, improved range of motion, better posture and core strength, increased lung function, weight loss, and positive self-image. Yoga's heart health benefits include lowered blood pressure, healthier heart rate, decreased cholesterol and triglyceride levels, improved immune function, and reduced stress. Seek guidance from your physician and exercise physiologist before implementing an exercise routine and learn your capabilities and proper form for all exercise.  Medical   Aging: Manufacturing systems engineer of Life  Clinical staff conducted group or individual video education with verbal and written material and guidebook.  Patient learns key strategies and recommendations to stay in good physical health and enhance quality of life, such as prevention strategies, having an advocate, securing a Health Care Proxy and Power of Attorney, and keeping a list of medications and system for tracking them. It also discusses how to avoid risk for bone loss.  Biology of Weight Control   Clinical staff conducted group or individual video education with verbal and written material and guidebook.  Patient learns that weight gain occurs because we consume more calories than we burn (eating more, moving less). Even if your body weight is normal, you may have higher ratios of fat compared to muscle mass. Too much body fat puts you at increased risk for cardiovascular disease, heart attack, stroke, type 2 diabetes, and obesity-related cancers. In addition to exercise, following the Pritikin Eating Plan can help reduce your risk.  Decoding Lab Results  Clinical staff conducted group or individual video education with verbal and written material and guidebook.  Patient learns that lab test reflects one measurement whose values change over time and are influenced by many factors, including medication, stress, sleep, exercise, food, hydration, pre-existing medical conditions, and more. It is recommended to use the knowledge from this video to become more involved with your lab results and evaluate your numbers to speak with your doctor.   Diseases of Our Time - Overview  Clinical staff conducted group or individual video education with verbal and written material and guidebook.  Patient learns that according to the CDC, 50% to 70% of chronic diseases (such as obesity, type 2 diabetes, elevated lipids, hypertension, and heart disease) are avoidable through lifestyle improvements including healthier food choices, listening to satiety cues, and increased physical activity.  Sleep Disorders Clinical staff conducted group or individual video education with verbal and written material and guidebook.  Patient learns how good quality and duration of sleep are important to overall health and well-being. Patient also learns about sleep disorders and how they impact health along with recommendations to address them, including discussing with a physician.  Nutrition  Dining Out - Part 2 Clinical staff  conducted group or individual video education with verbal and written material and guidebook.  Patient learns how to plan ahead and communicate in order to maximize their dining experience in a healthy and nutritious manner. Included are recommended food choices based on the type of restaurant the patient is visiting.   Fueling a Banker conducted group or individual video education with verbal and written material and guidebook.  There is a strong connection between our food choices and our health. Diseases like obesity and type 2 diabetes are very prevalent and are in large-part due to lifestyle choices. The Pritikin Eating Plan provides plenty of food and hunger-curbing satisfaction. It is easy to follow, affordable, and helps reduce health risks.  Menu Workshop  Clinical staff conducted group or individual video education with verbal and written material and guidebook.  Patient learns that restaurant meals can sabotage health goals because they are often packed with calories, fat, sodium, and sugar. Recommendations include strategies to plan ahead and to communicate with the manager, chef, or server to help order a healthier meal.  Planning Your Eating Strategy  Clinical staff conducted group or individual video education with verbal and written material and guidebook.  Patient learns about the Pritikin Eating Plan and its benefit of reducing the risk  of disease. The Pritikin Eating Plan does not focus on calories. Instead, it emphasizes high-quality, nutrient-rich foods. By knowing the characteristics of the foods, we choose, we can determine their calorie density and make informed decisions.  Targeting Your Nutrition Priorities  Clinical staff conducted group or individual video education with verbal and written material and guidebook.  Patient learns that lifestyle habits have a tremendous impact on disease risk and progression. This video provides eating and physical  activity recommendations based on your personal health goals, such as reducing LDL cholesterol, losing weight, preventing or controlling type 2 diabetes, and reducing high blood pressure.  Vitamins and Minerals  Clinical staff conducted group or individual video education with verbal and written material and guidebook.  Patient learns different ways to obtain key vitamins and minerals, including through a recommended healthy diet. It is important to discuss all supplements you take with your doctor.   Healthy Mind-Set    Smoking Cessation  Clinical staff conducted group or individual video education with verbal and written material and guidebook.  Patient learns that cigarette smoking and tobacco addiction pose a serious health risk which affects millions of people. Stopping smoking will significantly reduce the risk of heart disease, lung disease, and many forms of cancer. Recommended strategies for quitting are covered, including working with your doctor to develop a successful plan.  Culinary   Becoming a Set designer conducted group or individual video education with verbal and written material and guidebook.  Patient learns that cooking at home can be healthy, cost-effective, quick, and puts them in control. Keys to cooking healthy recipes will include looking at your recipe, assessing your equipment needs, planning ahead, making it simple, choosing cost-effective seasonal ingredients, and limiting the use of added fats, salts, and sugars.  Cooking - Breakfast and Snacks  Clinical staff conducted group or individual video education with verbal and written material and guidebook.  Patient learns how important breakfast is to satiety and nutrition through the entire day. Recommendations include key foods to eat during breakfast to help stabilize blood sugar levels and to prevent overeating at meals later in the day. Planning ahead is also a key component.  Cooking - Psychologist, educational conducted group or individual video education with verbal and written material and guidebook.  Patient learns eating strategies to improve overall health, including an approach to cook more at home. Recommendations include thinking of animal protein as a side on your plate rather than center stage and focusing instead on lower calorie dense options like vegetables, fruits, whole grains, and plant-based proteins, such as beans. Making sauces in large quantities to freeze for later and leaving the skin on your vegetables are also recommended to maximize your experience.  Cooking - Healthy Salads and Dressing Clinical staff conducted group or individual video education with verbal and written material and guidebook.  Patient learns that vegetables, fruits, whole grains, and legumes are the foundations of the Pritikin Eating Plan. Recommendations include how to incorporate each of these in flavorful and healthy salads, and how to create homemade salad dressings. Proper handling of ingredients is also covered. Cooking - Soups and [employer] - Soups and Desserts Clinical staff conducted group or individual video education with verbal and written material and guidebook.  Patient learns that Pritikin soups and desserts make for easy, nutritious, and delicious snacks and meal components that are low in sodium, fat, sugar, and calorie density, while high in vitamins, minerals, and filling  fiber. Recommendations include simple and healthy ideas for soups and desserts.   Overview     The Pritikin Solution Program Overview Clinical staff conducted group or individual video education with verbal and written material and guidebook.  Patient learns that the results of the Pritikin Program have been documented in more than 100 articles published in peer-reviewed journals, and the benefits include reducing risk factors for (and, in some cases, even reversing) high cholesterol, high  blood pressure, type 2 diabetes, obesity, and more! An overview of the three key pillars of the Pritikin Program will be covered: eating well, doing regular exercise, and having a healthy mind-set.  WORKSHOPS  Exercise: Exercise Basics: Building Your Action Plan Clinical staff led group instruction and group discussion with PowerPoint presentation and patient guidebook. To enhance the learning environment the use of posters, models and videos may be added. At the conclusion of this workshop, patients will comprehend the difference between physical activity and exercise, as well as the benefits of incorporating both, into their routine. Patients will understand the FITT (Frequency, Intensity, Time, and Type) principle and how to use it to build an exercise action plan. In addition, safety concerns and other considerations for exercise and cardiac rehab will be addressed by the presenter. The purpose of this lesson is to promote a comprehensive and effective weekly exercise routine in order to improve patients' overall level of fitness.   Managing Heart Disease: Your Path to a Healthier Heart Clinical staff led group instruction and group discussion with PowerPoint presentation and patient guidebook. To enhance the learning environment the use of posters, models and videos may be added.At the conclusion of this workshop, patients will understand the anatomy and physiology of the heart. Additionally, they will understand how Pritikin's three pillars impact the risk factors, the progression, and the management of heart disease.  The purpose of this lesson is to provide a high-level overview of the heart, heart disease, and how the Pritikin lifestyle positively impacts risk factors.  Exercise Biomechanics Clinical staff led group instruction and group discussion with PowerPoint presentation and patient guidebook. To enhance the learning environment the use of posters, models and videos may be added.  Patients will learn how the structural parts of their bodies function and how these functions impact their daily activities, movement, and exercise. Patients will learn how to promote a neutral spine, learn how to manage pain, and identify ways to improve their physical movement in order to promote healthy living. The purpose of this lesson is to expose patients to common physical limitations that impact physical activity. Participants will learn practical ways to adapt and manage aches and pains, and to minimize their effect on regular exercise. Patients will learn how to maintain good posture while sitting, walking, and lifting.  Balance Training and Fall Prevention  Clinical staff led group instruction and group discussion with PowerPoint presentation and patient guidebook. To enhance the learning environment the use of posters, models and videos may be added. At the conclusion of this workshop, patients will understand the importance of their sensorimotor skills (vision, proprioception, and the vestibular system) in maintaining their ability to balance as they age. Patients will apply a variety of balancing exercises that are appropriate for their current level of function. Patients will understand the common causes for poor balance, possible solutions to these problems, and ways to modify their physical environment in order to minimize their fall risk. The purpose of this lesson is to teach patients about the importance of maintaining balance  as they age and ways to minimize their risk of falling.  WORKSHOPS   Nutrition:  Fueling a Ship broker led group instruction and group discussion with PowerPoint presentation and patient guidebook. To enhance the learning environment the use of posters, models and videos may be added. Patients will review the foundational principles of the Pritikin Eating Plan and understand what constitutes a serving size in each of the food groups.  Patients will also learn Pritikin-friendly foods that are better choices when away from home and review make-ahead meal and snack options. Calorie density will be reviewed and applied to three nutrition priorities: weight maintenance, weight loss, and weight gain. The purpose of this lesson is to reinforce (in a group setting) the key concepts around what patients are recommended to eat and how to apply these guidelines when away from home by planning and selecting Pritikin-friendly options. Patients will understand how calorie density may be adjusted for different weight management goals.  Mindful Eating  Clinical staff led group instruction and group discussion with PowerPoint presentation and patient guidebook. To enhance the learning environment the use of posters, models and videos may be added. Patients will briefly review the concepts of the Pritikin Eating Plan and the importance of low-calorie dense foods. The concept of mindful eating will be introduced as well as the importance of paying attention to internal hunger signals. Triggers for non-hunger eating and techniques for dealing with triggers will be explored. The purpose of this lesson is to provide patients with the opportunity to review the basic principles of the Pritikin Eating Plan, discuss the value of eating mindfully and how to measure internal cues of hunger and fullness using the Hunger Scale. Patients will also discuss reasons for non-hunger eating and learn strategies to use for controlling emotional eating.  Targeting Your Nutrition Priorities Clinical staff led group instruction and group discussion with PowerPoint presentation and patient guidebook. To enhance the learning environment the use of posters, models and videos may be added. Patients will learn how to determine their genetic susceptibility to disease by reviewing their family history. Patients will gain insight into the importance of diet as part of an overall healthy  lifestyle in mitigating the impact of genetics and other environmental insults. The purpose of this lesson is to provide patients with the opportunity to assess their personal nutrition priorities by looking at their family history, their own health history and current risk factors. Patients will also be able to discuss ways of prioritizing and modifying the Pritikin Eating Plan for their highest risk areas  Menu  Clinical staff led group instruction and group discussion with PowerPoint presentation and patient guidebook. To enhance the learning environment the use of posters, models and videos may be added. Using menus brought in from [employer], or printed from [employer], patients will apply the Pritikin dining out guidelines that were presented in the [employer] video. Patients will also be able to practice these guidelines in a variety of provided scenarios. The purpose of this lesson is to provide patients with the opportunity to practice hands-on learning of the Pritikin Dining Out guidelines with actual menus and practice scenarios.  Label Reading Clinical staff led group instruction and group discussion with PowerPoint presentation and patient guidebook. To enhance the learning environment the use of posters, models and videos may be added. Patients will review and discuss the Pritikin label reading guidelines presented in Pritikin's Label Reading Educational series video. Using fool labels brought in from  local grocery stores and markets, patients will apply the label reading guidelines and determine if the packaged food meet the Pritikin guidelines. The purpose of this lesson is to provide patients with the opportunity to review, discuss, and practice hands-on learning of the Pritikin Label Reading guidelines with actual packaged food labels. Cooking School  Pritikin's [employer] are designed to teach patients ways to prepare quick, simple, and  affordable recipes at home. The importance of nutrition's role in chronic disease risk reduction is reflected in its emphasis in the overall Pritikin program. By learning how to prepare essential core Pritikin Eating Plan recipes, patients will increase control over what they eat; be able to customize the flavor of foods without the use of added salt, sugar, or fat; and improve the quality of the food they consume. By learning a set of core recipes which are easily assembled, quickly prepared, and affordable, patients are more likely to prepare more healthy foods at home. These workshops focus on convenient breakfasts, simple entres, side dishes, and desserts which can be prepared with minimal effort and are consistent with nutrition recommendations for cardiovascular risk reduction. Cooking [employer] are taught by a Armed forces logistics/support/administrative officer (RD) who has been trained by the [employer]. The chef or RD has a clear understanding of the importance of minimizing - if not completely eliminating - added fat, sugar, and sodium in recipes. Throughout the series of Cooking School Workshop sessions, patients will learn about healthy ingredients and efficient methods of cooking to build confidence in their capability to prepare    Cooking School weekly topics:  Adding Flavor- Sodium-Free  Fast and Healthy Breakfasts  Powerhouse Plant-Based Proteins  Satisfying Salads and Dressings  Simple Sides and Sauces  International Cuisine-Spotlight on the [employer] Zones  Delicious Desserts  Savory Soups  [employer] - Meals in a Astronomer Appetizers and Snacks  Comforting Weekend Breakfasts  One-Pot Wonders   Fast Evening Meals  Landscape architect Your Pritikin Plate  WORKSHOPS   Healthy Mindset (Psychosocial):  Focused Goals, Sustainable Changes Clinical staff led group instruction and group discussion with PowerPoint presentation and patient guidebook. To enhance  the learning environment the use of posters, models and videos may be added. Patients will be able to apply effective goal setting strategies to establish at least one personal goal, and then take consistent, meaningful action toward that goal. They will learn to identify common barriers to achieving personal goals and develop strategies to overcome them. Patients will also gain an understanding of how our mind-set can impact our ability to achieve goals and the importance of cultivating a positive and growth-oriented mind-set. The purpose of this lesson is to provide patients with a deeper understanding of how to set and achieve personal goals, as well as the tools and strategies needed to overcome common obstacles which may arise along the way.  From Head to Heart: The Power of a Healthy Outlook  Clinical staff led group instruction and group discussion with PowerPoint presentation and patient guidebook. To enhance the learning environment the use of posters, models and videos may be added. Patients will be able to recognize and describe the impact of emotions and mood on physical health. They will discover the importance of self-care and explore self-care practices which may work for them. Patients will also learn how to utilize the 4 C's to cultivate a healthier outlook and better manage stress and challenges. The purpose of this lesson is to demonstrate  to patients how a healthy outlook is an essential part of maintaining good health, especially as they continue their cardiac rehab journey.  Healthy Sleep for a Healthy Heart Clinical staff led group instruction and group discussion with PowerPoint presentation and patient guidebook. To enhance the learning environment the use of posters, models and videos may be added. At the conclusion of this workshop, patients will be able to demonstrate knowledge of the importance of sleep to overall health, well-being, and quality of life. They will understand the  symptoms of, and treatments for, common sleep disorders. Patients will also be able to identify daytime and nighttime behaviors which impact sleep, and they will be able to apply these tools to help manage sleep-related challenges. The purpose of this lesson is to provide patients with a general overview of sleep and outline the importance of quality sleep. Patients will learn about a few of the most common sleep disorders. Patients will also be introduced to the concept of "sleep hygiene," and discover ways to self-manage certain sleeping problems through simple daily behavior changes. Finally, the workshop will motivate patients by clarifying the links between quality sleep and their goals of heart-healthy living.   Recognizing and Reducing Stress Clinical staff led group instruction and group discussion with PowerPoint presentation and patient guidebook. To enhance the learning environment the use of posters, models and videos may be added. At the conclusion of this workshop, patients will be able to understand the types of stress reactions, differentiate between acute and chronic stress, and recognize the impact that chronic stress has on their health. They will also be able to apply different coping mechanisms, such as reframing negative self-talk. Patients will have the opportunity to practice a variety of stress management techniques, such as deep abdominal breathing, progressive muscle relaxation, and/or guided imagery.  The purpose of this lesson is to educate patients on the role of stress in their lives and to provide healthy techniques for coping with it.  Learning Barriers/Preferences:  Learning Barriers/Preferences - 06/20/23 1112       Learning Barriers/Preferences   Learning Barriers None    Learning Preferences Group Instruction;Individual Instruction;Skilled Demonstration;Verbal Instruction          Education Topics:  Knowledge Questionnaire Score:  Knowledge Questionnaire Score  - 06/20/23 1112       Knowledge Questionnaire Score   Pre Score 20/24          Core Components/Risk Factors/Patient Goals at Admission:  Personal Goals and Risk Factors at Admission - 06/20/23 1342       Core Components/Risk Factors/Patient Goals on Admission    Weight Management Yes;Weight Loss    Intervention Weight Management: Develop a combined nutrition and exercise program designed to reach desired caloric intake, while maintaining appropriate intake of nutrient and fiber, sodium and fats, and appropriate energy expenditure required for the weight goal.;Weight Management: Provide education and appropriate resources to help participant work on and attain dietary goals.    Expected Outcomes Short Term: Continue to assess and modify interventions until short term weight is achieved;Long Term: Adherence to nutrition and physical activity/exercise program aimed toward attainment of established weight goal;Weight Maintenance: Understanding of the daily nutrition guidelines, which includes 25-35% calories from fat, 7% or less cal from saturated fats, less than 200mg  cholesterol, less than 1.5gm of sodium, & 5 or more servings of fruits and vegetables daily;Weight Loss: Understanding of general recommendations for a balanced deficit meal plan, which promotes 1-2 lb weight loss per week and includes a  negative energy balance of [phone] kcal/d;Understanding recommendations for meals to include 15-35% energy as protein, 25-35% energy from fat, 35-60% energy from carbohydrates, less than 200mg  of dietary cholesterol, 20-35 gm of total fiber daily;Understanding of distribution of calorie intake throughout the day with the consumption of 4-5 meals/snacks    Hypertension Yes    Intervention Provide education on lifestyle modifcations including regular physical activity/exercise, weight management, moderate sodium restriction and increased consumption of fresh fruit, vegetables, and low fat dairy, alcohol  moderation, and smoking cessation.;Monitor prescription use compliance.    Expected Outcomes Long Term: Maintenance of blood pressure at goal levels.;Short Term: Continued assessment and intervention until BP is < 140/73mm HG in hypertensive participants. < 130/69mm HG in hypertensive participants with diabetes, heart failure or chronic kidney disease.    Lipids Yes    Intervention Provide education and support for participant on nutrition & aerobic/resistive exercise along with prescribed medications to achieve LDL 70mg , HDL >40mg .    Expected Outcomes Long Term: Cholesterol controlled with medications as prescribed, with individualized exercise RX and with personalized nutrition plan. Value goals: LDL < 70mg , HDL > 40 mg.;Short Term: Participant states understanding of desired cholesterol values and is compliant with medications prescribed. Participant is following exercise prescription and nutrition guidelines.          Core Components/Risk Factors/Patient Goals Review:   Goals and Risk Factor Review     Row Name 07/12/23 1326 08/08/23 1610           Core Components/Risk Factors/Patient Goals Review   Personal Goals Review Weight Management/Obesity;Hypertension;Lipids Weight Management/Obesity;Hypertension;Lipids      Review Christopher Lara is doing very well with exercise at cardiac rehab. Christopher Lara has increased his met levels and has lost 4.0 kg since starting the program. Christopher Lara continues to do  very well with exercise at cardiac rehab. Christopher Lara continues to  increase his met levels and has lost 5.1  kg since starting the program.      Expected Outcomes Christopher Lara will continue to participate in cardiac rehab for exercise, nutrtion and lifestyle modifications Christopher Lara will continue to participate in cardiac rehab for exercise, nutrtion and lifestyle modifications         Core Components/Risk Factors/Patient Goals at Discharge (Final Review):   Goals and Risk Factor Review - 08/08/23 0822       Core  Components/Risk Factors/Patient Goals Review   Personal Goals Review Weight Management/Obesity;Hypertension;Lipids    Review Christopher Lara continues to do  very well with exercise at cardiac rehab. Christopher Lara continues to  increase his met levels and has lost 5.1  kg since starting the program.    Expected Outcomes Christopher Lara will continue to participate in cardiac rehab for exercise, nutrtion and lifestyle modifications          ITP Comments:  ITP Comments     Row Name 06/20/23 1059 07/12/23 1325 08/08/23 0821       ITP Comments Dr. Gaylyn Keas medical director. Introduction to pritikin education/intensive cardiac rehab. Initial orientation packet reviewed with patient. 30 Day ITP Review. Christopher Lara has has good attendance and participation with exercise at cardiac rehab. 30 Day ITP Review. Christopher Lara continues to have good attendance and participation with exercise at cardiac rehab.        Comments: See ITP comment

## 2023-08-14 ENCOUNTER — Encounter (HOSPITAL_COMMUNITY)
Admission: RE | Admit: 2023-08-14 | Discharge: 2023-08-14 | Disposition: A | Discharge status: Home / Self Care | Source: Ambulatory Visit | Attending: Cardiology

## 2023-08-14 DIAGNOSIS — Z955 Presence of coronary angioplasty implant and graft: Secondary | ICD-10-CM | POA: Diagnosis not present

## 2023-08-14 DIAGNOSIS — I214 Non-ST elevation (NSTEMI) myocardial infarction: Secondary | ICD-10-CM

## 2023-08-14 DIAGNOSIS — I252 Old myocardial infarction: Secondary | ICD-10-CM | POA: Diagnosis not present

## 2023-08-16 ENCOUNTER — Other Ambulatory Visit (HOSPITAL_COMMUNITY): Payer: Self-pay

## 2023-08-16 ENCOUNTER — Telehealth: Payer: Self-pay | Admitting: Pharmacist

## 2023-08-16 ENCOUNTER — Telehealth: Payer: Self-pay

## 2023-08-16 ENCOUNTER — Encounter: Payer: Self-pay | Admitting: Pharmacist

## 2023-08-16 ENCOUNTER — Ambulatory Visit: Attending: Cardiology | Admitting: Pharmacist

## 2023-08-16 ENCOUNTER — Encounter (HOSPITAL_COMMUNITY)
Admission: RE | Admit: 2023-08-16 | Discharge: 2023-08-16 | Disposition: A | Discharge status: Home / Self Care | Source: Ambulatory Visit | Attending: Cardiology | Admitting: Cardiology

## 2023-08-16 VITALS — BP 110/70

## 2023-08-16 DIAGNOSIS — E559 Vitamin D deficiency, unspecified: Secondary | ICD-10-CM | POA: Insufficient documentation

## 2023-08-16 DIAGNOSIS — M109 Gout, unspecified: Secondary | ICD-10-CM | POA: Insufficient documentation

## 2023-08-16 DIAGNOSIS — E230 Hypopituitarism: Secondary | ICD-10-CM | POA: Insufficient documentation

## 2023-08-16 DIAGNOSIS — I214 Non-ST elevation (NSTEMI) myocardial infarction: Secondary | ICD-10-CM | POA: Diagnosis not present

## 2023-08-16 DIAGNOSIS — E782 Mixed hyperlipidemia: Secondary | ICD-10-CM | POA: Diagnosis not present

## 2023-08-16 DIAGNOSIS — E213 Hyperparathyroidism, unspecified: Secondary | ICD-10-CM | POA: Insufficient documentation

## 2023-08-16 DIAGNOSIS — I25110 Atherosclerotic heart disease of native coronary artery with unstable angina pectoris: Secondary | ICD-10-CM | POA: Diagnosis not present

## 2023-08-16 DIAGNOSIS — Z955 Presence of coronary angioplasty implant and graft: Secondary | ICD-10-CM

## 2023-08-16 DIAGNOSIS — I252 Old myocardial infarction: Secondary | ICD-10-CM | POA: Diagnosis not present

## 2023-08-16 DIAGNOSIS — E038 Other specified hypothyroidism: Secondary | ICD-10-CM | POA: Insufficient documentation

## 2023-08-16 MED ORDER — PRASUGREL HCL 10 MG PO TABS
10.0000 mg | ORAL_TABLET | Freq: Every day | ORAL | 3 refills | Status: AC
  Filled 2023-08-16: qty 90, 90d supply, fill #0
  Filled 2023-11-06: qty 90, 90d supply, fill #1
  Filled 2024-02-05: qty 90, 90d supply, fill #2

## 2023-08-16 MED ORDER — ASPIRIN 81 MG PO TBEC
81.0000 mg | DELAYED_RELEASE_TABLET | Freq: Every day | ORAL | 3 refills | Status: AC
  Filled 2023-08-16: qty 90, 90d supply, fill #0
  Filled 2023-11-06: qty 90, 90d supply, fill #1
  Filled 2024-02-05: qty 90, 90d supply, fill #2

## 2023-08-16 NOTE — Telephone Encounter (Signed)
 Please complete PA for Repatha/Praluent

## 2023-08-16 NOTE — Patient Instructions (Addendum)
 It was nice meeting you today  Glad you are feeling better  We would like your LDL (bad cholesterol) to be less than 55  The medication we discussed today is called Repatha, which is an injection you would take once every 2 weeks   I will complete the prior authorization for you and contact you when it is approved   Once you start the medication, we will recheck your fasting lipid panel in about 3 months  Joelene Murrain, PharmD, BCACP, CDCES, CPP [facility] [address], [place], [place] [address] Phone: [phone]; Fax: [phone] 08/16/2023 1:39 PM

## 2023-08-16 NOTE — Progress Notes (Cosign Needed)
 Patient ID: Christopher Lara                 DOB: 06/16/1975                    MRN: [id number]     HPI: Christopher Lara is a 48 y.o. male patient referred to lipid clinic by [facility]. Patient of Dr Swaziland. PMH is significant for NSTEMI, CAD, hypothyroidism, and hypopituitarism.   Patient has chronic CK elevations and is not a candidate for statin therapy.  Patient presents today in good spirits. Reports no chest pain or SOB. Did not actually have chest pain when he presented to ED either, only severe left arm pain. Troponin was 29528 upon arrival.  Cardiac cath performed with two DES. Discharged on aspirin , Effient , and losartan . Has not picked up losartan  recently.   Has been going to cardiac rehab 3x weekly and doing well. Has been watching his diet and concentrating only on vegetables and lean proteins. No past use of tobacco.  Current Medications: N/A  Intolerances: N/A  Risk Factors:  NSTEMI CAD  LDL goal: <55  Labs:LPA 72.3, TC 177, Trigs 125, HDL 35, LDL 117 (06/05/23)  Past Medical History:  Diagnosis Date   Hypercalcemia    Hyperparathyroidism (HCC)    Hyponatremia    Hypothyroidism    Kidney stone    Panhypopituitarism (HCC)    Pericardial effusion    Pneumonia due to COVID-19 virus     Current Outpatient Medications on File Prior to Visit  Medication Sig Dispense Refill   acetaminophen  (TYLENOL ) 650 MG CR tablet Take 650 mg by mouth every 8 (eight) hours as needed for pain.     aspirin  EC 81 MG tablet Take 1 tablet (81 mg total) by mouth daily. Swallow whole. 90 tablet 3   ergocalciferol (VITAMIN D2) 1.25 MG (50000 UT) capsule Take 50,000 Units by mouth once a week. On Sundays     hydrocortisone  (CORTEF ) 5 MG tablet Take 15 mg by mouth in the morning.     levothyroxine  (SYNTHROID ) 100 MCG tablet Take 100 mcg by mouth every morning. (Patient not taking: Reported on 06/17/2023)     levothyroxine  (SYNTHROID ) 112 MCG tablet 1 tablet in the morning on an empty  stomach Orally Once a day for 30 days STOP 100 MCG TABLETS     losartan  (COZAAR ) 25 MG tablet Take 0.5 tablets (12.5 mg total) by mouth daily. 15 tablet 11   nitroGLYCERIN  (NITROSTAT ) 0.4 MG SL tablet Place 1 tablet (0.4 mg total) under the tongue every 5 (five) minutes x 3 doses as needed for chest pain. 25 tablet 3   prasugrel  (EFFIENT ) 10 MG TABS tablet Take 1 tablet (10 mg total) by mouth daily. 90 tablet 3   [DISCONTINUED] omeprazole  (PRILOSEC) 20 MG capsule Take 1 capsule (20 mg total) by mouth daily. 30 minutes prior to meal. (Patient not taking: Reported on 10/01/2014) 30 capsule 0   No current facility-administered medications on file prior to visit.    No Known Allergies  Assessment/Plan:  1. Hyperlipidemia - Patient last LDL 117 which is above goal of <55. Not a candidate for statins due to chronically elevated CK. Due to NSTEMI, recommend starting PCSK9i.  Using demo pen, educated patient on mechanism of action, storage, site selection, administration and possible adverse effects. Patient able to demonstrate in room. Recommended continuing healthy eating including vegetables, lean proteins, healthy fats, and whole grains. Continue physical activity.  Will send message  to Dr Swaziland regarding losartan . Effient  and aspirin  refilled.  Start Repatha/Praluent q 2 weeks Recheck lipid panel in 2-3 months  [name], PharmD, BCACP, CDCES, CPP [address] [place], [place], [address] Phone: [phone], Fax: [phone]

## 2023-08-16 NOTE — Telephone Encounter (Addendum)
 Pharmacy Patient Advocate Encounter   Received notification from Physician's Office that prior authorization for REPATHA is required/requested.   Insurance verification completed.   The patient is insured through [facility] .   Per test claim: PA required; PA submitted to above mentioned insurance via CoverMyMeds Key/confirmation #/EOC [facility] Status is pending

## 2023-08-16 NOTE — Telephone Encounter (Signed)
 Pharmacy Patient Advocate Encounter   Received notification from Physician's Office that prior authorization for REPATHA is required/requested.   Insurance verification completed.   The patient is insured through [facility] .   Per test claim: PA required; PA started via CoverMyMeds. KEY [id number] . Please see clinical question(s) below that I am not finding the answer to in their chart and advise.

## 2023-08-19 ENCOUNTER — Encounter (HOSPITAL_COMMUNITY)
Admission: RE | Admit: 2023-08-19 | Discharge: 2023-08-19 | Disposition: A | Discharge status: Home / Self Care | Source: Ambulatory Visit | Attending: Cardiology

## 2023-08-19 ENCOUNTER — Other Ambulatory Visit (HOSPITAL_COMMUNITY): Payer: Self-pay

## 2023-08-19 ENCOUNTER — Telehealth: Payer: Self-pay

## 2023-08-19 DIAGNOSIS — Z955 Presence of coronary angioplasty implant and graft: Secondary | ICD-10-CM

## 2023-08-19 DIAGNOSIS — I214 Non-ST elevation (NSTEMI) myocardial infarction: Secondary | ICD-10-CM | POA: Diagnosis not present

## 2023-08-19 DIAGNOSIS — I252 Old myocardial infarction: Secondary | ICD-10-CM | POA: Diagnosis not present

## 2023-08-19 NOTE — Telephone Encounter (Signed)
 Pharmacy Patient Advocate Encounter  Received notification from [facility] that Prior Authorization for REPATHA has been APPROVED from 08/18/23 to 08/17/24. Ran test claim, Copay is $88.20. This test claim was processed through [facility]- copay amounts may vary at other pharmacies due to pharmacy/plan contracts, or as the patient moves through the different stages of their insurance plan.

## 2023-08-19 NOTE — Telephone Encounter (Signed)
 Spoke to patient Dr.Jordan advised you can stop Losartan .Advised to continue to monitor B/P and call back if elevated.

## 2023-08-20 ENCOUNTER — Other Ambulatory Visit (HOSPITAL_COMMUNITY): Payer: Self-pay

## 2023-08-20 MED ORDER — REPATHA SURECLICK 140 MG/ML ~~LOC~~ SOAJ
1.0000 mL | SUBCUTANEOUS | 11 refills | Status: AC
  Filled 2023-08-20 (×2): qty 2, 28d supply, fill #0
  Filled 2023-09-10: qty 2, 28d supply, fill #1
  Filled 2023-10-13: qty 2, 28d supply, fill #2
  Filled 2023-11-06: qty 2, 28d supply, fill #3
  Filled 2023-12-10: qty 2, 28d supply, fill #4
  Filled 2024-01-06: qty 2, 28d supply, fill #5
  Filled 2024-02-05: qty 2, 28d supply, fill #6
  Filled 2024-03-03: qty 2, 28d supply, fill #7
  Filled 2024-03-26: qty 2, 28d supply, fill #8

## 2023-08-20 NOTE — Addendum Note (Signed)
 Addended by: DARRELL BRUCKNER on: 08/20/2023 05:01 PM   Modules accepted: Orders

## 2023-08-20 NOTE — Telephone Encounter (Signed)
 Called and spoke with patient. Advised Repatha approved. Recommended he download savings card from [employer]. Recheck lipid panel in 3 months

## 2023-08-21 ENCOUNTER — Encounter (HOSPITAL_COMMUNITY)
Admission: RE | Admit: 2023-08-21 | Discharge: 2023-08-21 | Disposition: A | Discharge status: Home / Self Care | Source: Ambulatory Visit | Attending: Cardiology

## 2023-08-21 DIAGNOSIS — Z955 Presence of coronary angioplasty implant and graft: Secondary | ICD-10-CM

## 2023-08-21 DIAGNOSIS — I252 Old myocardial infarction: Secondary | ICD-10-CM | POA: Diagnosis not present

## 2023-08-21 DIAGNOSIS — I214 Non-ST elevation (NSTEMI) myocardial infarction: Secondary | ICD-10-CM

## 2023-08-22 ENCOUNTER — Other Ambulatory Visit (HOSPITAL_COMMUNITY): Payer: Self-pay

## 2023-08-22 DIAGNOSIS — E2749 Other adrenocortical insufficiency: Secondary | ICD-10-CM | POA: Diagnosis not present

## 2023-08-22 DIAGNOSIS — E230 Hypopituitarism: Secondary | ICD-10-CM | POA: Diagnosis not present

## 2023-08-22 DIAGNOSIS — E038 Other specified hypothyroidism: Secondary | ICD-10-CM | POA: Diagnosis not present

## 2023-08-22 DIAGNOSIS — E559 Vitamin D deficiency, unspecified: Secondary | ICD-10-CM | POA: Diagnosis not present

## 2023-08-23 ENCOUNTER — Encounter (HOSPITAL_COMMUNITY)
Admission: RE | Admit: 2023-08-23 | Discharge: 2023-08-23 | Disposition: A | Discharge status: Home / Self Care | Source: Ambulatory Visit | Attending: Cardiology

## 2023-08-23 DIAGNOSIS — I214 Non-ST elevation (NSTEMI) myocardial infarction: Secondary | ICD-10-CM | POA: Diagnosis not present

## 2023-08-23 DIAGNOSIS — Z955 Presence of coronary angioplasty implant and graft: Secondary | ICD-10-CM | POA: Diagnosis not present

## 2023-08-23 DIAGNOSIS — I252 Old myocardial infarction: Secondary | ICD-10-CM | POA: Diagnosis not present

## 2023-08-26 ENCOUNTER — Other Ambulatory Visit (HOSPITAL_COMMUNITY): Payer: Self-pay | Admitting: Internal Medicine

## 2023-08-26 ENCOUNTER — Encounter (HOSPITAL_COMMUNITY)
Admission: RE | Admit: 2023-08-26 | Discharge: 2023-08-26 | Disposition: A | Discharge status: Home / Self Care | Source: Ambulatory Visit | Attending: Cardiology

## 2023-08-26 DIAGNOSIS — Z955 Presence of coronary angioplasty implant and graft: Secondary | ICD-10-CM | POA: Diagnosis not present

## 2023-08-26 DIAGNOSIS — E213 Hyperparathyroidism, unspecified: Secondary | ICD-10-CM

## 2023-08-26 DIAGNOSIS — I214 Non-ST elevation (NSTEMI) myocardial infarction: Secondary | ICD-10-CM | POA: Diagnosis not present

## 2023-08-26 DIAGNOSIS — I252 Old myocardial infarction: Secondary | ICD-10-CM | POA: Diagnosis not present

## 2023-08-28 ENCOUNTER — Encounter (HOSPITAL_COMMUNITY)
Admission: RE | Admit: 2023-08-28 | Discharge: 2023-08-28 | Disposition: A | Discharge status: Home / Self Care | Source: Ambulatory Visit | Attending: Cardiology | Admitting: Cardiology

## 2023-08-28 DIAGNOSIS — Z955 Presence of coronary angioplasty implant and graft: Secondary | ICD-10-CM | POA: Diagnosis not present

## 2023-08-28 DIAGNOSIS — I214 Non-ST elevation (NSTEMI) myocardial infarction: Secondary | ICD-10-CM | POA: Insufficient documentation

## 2023-09-02 ENCOUNTER — Encounter (HOSPITAL_COMMUNITY)
Admission: RE | Admit: 2023-09-02 | Discharge: 2023-09-02 | Disposition: A | Discharge status: Home / Self Care | Source: Ambulatory Visit | Attending: Cardiology | Admitting: Cardiology

## 2023-09-02 DIAGNOSIS — Z955 Presence of coronary angioplasty implant and graft: Secondary | ICD-10-CM

## 2023-09-02 DIAGNOSIS — I214 Non-ST elevation (NSTEMI) myocardial infarction: Secondary | ICD-10-CM

## 2023-09-04 ENCOUNTER — Encounter (HOSPITAL_COMMUNITY)
Admission: RE | Admit: 2023-09-04 | Discharge: 2023-09-04 | Disposition: A | Discharge status: Home / Self Care | Source: Ambulatory Visit | Attending: Cardiology

## 2023-09-04 DIAGNOSIS — I214 Non-ST elevation (NSTEMI) myocardial infarction: Secondary | ICD-10-CM

## 2023-09-04 DIAGNOSIS — Z955 Presence of coronary angioplasty implant and graft: Secondary | ICD-10-CM

## 2023-09-06 ENCOUNTER — Encounter (HOSPITAL_COMMUNITY)
Admission: RE | Admit: 2023-09-06 | Discharge: 2023-09-06 | Disposition: A | Discharge status: Home / Self Care | Source: Ambulatory Visit | Attending: Cardiology

## 2023-09-06 DIAGNOSIS — I214 Non-ST elevation (NSTEMI) myocardial infarction: Secondary | ICD-10-CM | POA: Diagnosis not present

## 2023-09-06 DIAGNOSIS — Z955 Presence of coronary angioplasty implant and graft: Secondary | ICD-10-CM | POA: Diagnosis not present

## 2023-09-09 ENCOUNTER — Encounter (HOSPITAL_COMMUNITY)
Admission: RE | Admit: 2023-09-09 | Discharge: 2023-09-09 | Disposition: A | Discharge status: Home / Self Care | Source: Ambulatory Visit | Attending: Cardiology | Admitting: Cardiology

## 2023-09-09 DIAGNOSIS — I214 Non-ST elevation (NSTEMI) myocardial infarction: Secondary | ICD-10-CM | POA: Diagnosis not present

## 2023-09-09 DIAGNOSIS — Z955 Presence of coronary angioplasty implant and graft: Secondary | ICD-10-CM

## 2023-09-10 ENCOUNTER — Encounter: Payer: Self-pay | Admitting: Pharmacist

## 2023-09-10 ENCOUNTER — Other Ambulatory Visit (HOSPITAL_COMMUNITY): Payer: Self-pay

## 2023-09-10 ENCOUNTER — Ambulatory Visit (HOSPITAL_COMMUNITY)
Admission: RE | Admit: 2023-09-10 | Discharge: 2023-09-10 | Disposition: A | Discharge status: Home / Self Care | Source: Ambulatory Visit | Attending: Internal Medicine | Admitting: Internal Medicine

## 2023-09-10 DIAGNOSIS — E213 Hyperparathyroidism, unspecified: Secondary | ICD-10-CM | POA: Diagnosis not present

## 2023-09-10 DIAGNOSIS — E039 Hypothyroidism, unspecified: Secondary | ICD-10-CM | POA: Diagnosis not present

## 2023-09-10 MED ORDER — TECHNETIUM TC 99M SESTAMIBI - CARDIOLITE
26.0000 | Freq: Once | INTRAVENOUS | Status: AC | PRN
  Administered 2023-09-10: 26 via INTRAVENOUS

## 2023-09-10 NOTE — Progress Notes (Signed)
 Cardiac Individual Treatment Plan  Patient Details  Name: Christopher Lara MRN: [id number] Date of Birth: [birth year] Referring Provider:   Flowsheet Row INTENSIVE CARDIAC REHAB ORIENT from 06/20/2023 in [facility]  Referring Provider Dr. [place] Peter    Initial Encounter Date:  Flowsheet Row INTENSIVE CARDIAC REHAB ORIENT from 06/20/2023 in [facility] for Heart, Vascular, & Lung Health  Date 06/20/23    Visit Diagnosis: 06/04/23 NSTEMI (non-ST elevated myocardial infarction) (HCC)  06/04/23 Status post coronary artery stent placement  Patient's Home Medications on Admission:  Current Outpatient Medications:    acetaminophen  (TYLENOL ) 650 MG CR tablet, Take 650 mg by mouth every 8 (eight) hours as needed for pain., Disp: , Rfl:    aspirin  EC 81 MG tablet, Take 1 tablet (81 mg total) by mouth daily. Swallow whole., Disp: 90 tablet, Rfl: 3   ergocalciferol (VITAMIN D2) 1.25 MG (50000 UT) capsule, Take 50,000 Units by mouth once a week. On Sundays, Disp: , Rfl:    Evolocumab  (REPATHA  SURECLICK) 140 MG/ML SOAJ, Inject 140 mg into the skin every 14 (fourteen) days., Disp: 2 mL, Rfl: 11   hydrocortisone  (CORTEF ) 5 MG tablet, Take 15 mg by mouth in the morning., Disp: , Rfl:    levothyroxine  (SYNTHROID ) 100 MCG tablet, Take 100 mcg by mouth every morning. (Patient not taking: Reported on 06/17/2023), Disp: , Rfl:    levothyroxine  (SYNTHROID ) 112 MCG tablet, 1 tablet in the morning on an empty stomach Orally Once a day for 30 days STOP 100 MCG TABLETS, Disp: , Rfl:    nitroGLYCERIN  (NITROSTAT ) 0.4 MG SL tablet, Place 1 tablet (0.4 mg total) under the tongue every 5 (five) minutes x 3 doses as needed for chest pain., Disp: 25 tablet, Rfl: 3   prasugrel  (EFFIENT ) 10 MG TABS tablet, Take 1 tablet (10 mg total) by mouth daily., Disp: 90 tablet, Rfl: 3  Past Medical History: Past Medical History:  Diagnosis Date    Hypercalcemia    Hyperparathyroidism (HCC)    Hyponatremia    Hypothyroidism    Kidney stone    Panhypopituitarism (HCC)    Pericardial effusion    Pneumonia due to COVID-19 virus     Tobacco Use: Social History   Tobacco Use  Smoking Status Never   Passive exposure: Never  Smokeless Tobacco Never    Labs: Review Flowsheet       Latest Ref Rng & Units 06/05/2023  Labs for ITP Cardiac and Pulmonary Rehab  Cholestrol 0 - 200 mg/dL 822   LDL (calc) 0 - 99 mg/dL 882   HDL-C >59 mg/dL 35   Trlycerides <849 mg/dL 874   Hemoglobin J8r 4.8 - 5.6 % 4.8     Capillary Blood Glucose: Lab Results  Component Value Date   GLUCAP 80 02/22/2021   GLUCAP 58 (L) 02/22/2021   GLUCAP 74 03/18/2019   GLUCAP 65 (L) 03/18/2019   GLUCAP 90 03/15/2019     Exercise Target Goals: Exercise Program Goal: Individual exercise prescription set using results from initial 6 min walk test and THRR while considering  patient's activity barriers and safety.   Exercise Prescription Goal: Initial exercise prescription builds to 30-45 minutes a day of aerobic activity, 2-3 days per week.  Home exercise guidelines will be given to patient during program as part of exercise prescription that the participant will acknowledge.  Activity Barriers & Risk Stratification:  Activity Barriers & Cardiac Risk Stratification -  06/20/23 1101       Activity Barriers & Cardiac Risk Stratification   Activity Barriers None    Cardiac Risk Stratification Moderate          6 Minute Walk:  6 Minute Walk     Row Name 06/20/23 1320         6 Minute Walk   Phase Initial     Distance 1440 feet     Walk Time 6 minutes     # of Rest Breaks 0     MPH 2.72     METS 4.5     RPE 8     Perceived Dyspnea  0     VO2 Peak 15.83     Symptoms No     Resting HR 70 bpm     Resting BP 112/68     Resting Oxygen Saturation  100 %     Max Ex. HR 118 bpm     Max Ex. BP 122/70     2 Minute Post BP 120/76         Oxygen Initial Assessment:   Oxygen Re-Evaluation:   Oxygen Discharge (Final Oxygen Re-Evaluation):   Initial Exercise Prescription:  Initial Exercise Prescription - 06/20/23 1300       Date of Initial Exercise RX and Referring Provider   Date 06/20/23    Referring Provider Dr. [place] Peter    Expected Discharge Date 09/11/23      Bike   Level 2    Watts 26    Minutes 15    METs 2.4      Elliptical   Level 1    Speed 1    Minutes 15    METs 3.4      Prescription Details   Frequency (times per week) 3 days/week    Duration Progress to 30 minutes of continuous aerobic without signs/symptoms of physical distress      Intensity   THRR 40-80% of Max Heartrate 68-137    Ratings of Perceived Exertion 11-13      Progression   Progression Continue to progress workloads to maintain intensity without signs/symptoms of physical distress.      Resistance Training   Training Prescription Yes    Weight 4    Reps 10-15          Perform Capillary Blood Glucose checks as needed.  Exercise Prescription Changes:   Exercise Prescription Changes     Row Name 06/24/23 0821 07/10/23 0800 07/24/23 0856 08/07/23 0830 08/23/23 0848     Response to Exercise   Blood Pressure (Admit) 128/80 120/62 124/76 118/62 120/64   Blood Pressure (Exercise) 144/82 -- -- -- --   Blood Pressure (Exit) 110/66 104/68 110/76 110/64 104/72   Heart Rate (Admit) 84 bpm 78 bpm 76 bpm 68 bpm 71 bpm   Heart Rate (Exercise) 144 bpm 124 bpm 127 bpm 138 bpm 130 bpm   Heart Rate (Exit) 97 bpm 87 bpm 79 bpm 80 bpm 86 bpm   Rating of Perceived Exertion (Exercise) 10 10 7.5 7.5 8   Perceived Dyspnea (Exercise) 0 0 0 0 0   Symptoms none 0 0 0 0   Comments Pt first day in the [facility] iCR program Reviewed MET's, goals and home ExRx Reviewed MET's Reviewed MET's and goals Reviewed MET's   Duration Progress to 30 minutes of  aerobic without signs/symptoms of physical distress Progress to 30 minutes of   aerobic without signs/symptoms of physical distress Progress to  30 minutes of  aerobic without signs/symptoms of physical distress Progress to 30 minutes of  aerobic without signs/symptoms of physical distress Progress to 30 minutes of  aerobic without signs/symptoms of physical distress   Intensity THRR unchanged THRR unchanged THRR unchanged THRR unchanged THRR unchanged     Progression   Progression Continue to progress workloads to maintain intensity without signs/symptoms of physical distress. Continue to progress workloads to maintain intensity without signs/symptoms of physical distress. Continue to progress workloads to maintain intensity without signs/symptoms of physical distress. Continue to progress workloads to maintain intensity without signs/symptoms of physical distress. Continue to progress workloads to maintain intensity without signs/symptoms of physical distress.   Average METs 4.63 4.9 5.8 6.7 7.65     Resistance Training   Training Prescription Yes No No No Yes   Weight 4 4 4 6 6    Reps 10-15 10-15 10-15 10-15 10-15   Time 10 Minutes 10 Minutes 10 Minutes 10 Minutes 10 Minutes     Bike   Level 2 2.5 3 4  4.5   Watts 44 38 50 57 72   Minutes 15 15 15  73 63   METs 4.6 4.8 5.5 6.7 7.8     Elliptical   Level 1 1 2 3 4    Speed 1 3 3 3 3    Minutes 15 15 15 15 15    METs 3.6  Moved up level 1/3 AVG MET's 5.7. Just over THRR, DEC wl for next visit 5 6.1 6.7 7.5     Home Exercise Plan   Plans to continue exercise at -- Home (comment) Home (comment) Home (comment) Home (comment)   Frequency -- Add 2 additional days to program exercise sessions. Add 2 additional days to program exercise sessions. Add 2 additional days to program exercise sessions. Add 2 additional days to program exercise sessions.   Initial Home Exercises Provided -- 07/10/23 07/10/23 07/10/23 07/10/23    Row Name 09/04/23 1437             Response to Exercise   Blood Pressure (Admit) 108/70        Blood Pressure (Exit) 104/74       Heart Rate (Admit) 75 bpm       Heart Rate (Exercise) 137 bpm       Heart Rate (Exit) 85 bpm       Rating of Perceived Exertion (Exercise) 8       Perceived Dyspnea (Exercise) 0       Symptoms 0       Comments Reviewed MET's and goals       Duration Progress to 30 minutes of  aerobic without signs/symptoms of physical distress       Intensity THRR unchanged         Progression   Progression Continue to progress workloads to maintain intensity without signs/symptoms of physical distress.       Average METs 7.8         Resistance Training   Training Prescription No       Weight 6       Reps 10-15       Time 10 Minutes         Bike   Level 4.5       Watts 75       Minutes 65       METs 8         Elliptical   Level 4       Speed 3  Minutes 15       METs 7.6         Home Exercise Plan   Plans to continue exercise at Home (comment)       Frequency Add 2 additional days to program exercise sessions.       Initial Home Exercises Provided 07/10/23          Exercise Comments:   Exercise Comments     Row Name 06/20/23 1102 06/24/23 0829 07/10/23 0838 07/24/23 0858 08/07/23 0830   Exercise Comments Pt orientation. Pt maintain SR during . Pt asymptomatic. Pt first day in teh [facility]ikin ICR program. Pt tolerated exercise well with an average MET level of 4.63. Pt is off to a good start, attempted to INC WL but over THRR, will work with pt to get intensities in THRR. Reviewed MET's, goals and home ExRx. Pt tolerated exercise well with an average MET level of 4.9. He is doing well and progressing MET's. He is already feeling an increase in strength and stamina and is increasing his nutrition knowledge. Another goal was to get into a regular exercise routine, he's been doing great here, vitals good so he is planning on adding some days in at home by biking, walking and using some light hand weights. He plans to add in 1-2 days for 30-45 mins a  session Reviewed MET's. Pt tolerated exercise well with an average MET level of 5.8. He is doing well and progressing MET's. We're working together to increase intensities and stay within [employer]. He is feeling good and increasing stamina Reviewed MET's and goals. Pt tolerated exercise well with an average MET level of 6.7. He is doing well and progressing MET's. Working together to [employer] as tolerated by [employer]. He has a fitness tracker at home now and is doing more bike riding and some handweights at home. He's feeling good and is increasing strength    Row Name 08/23/23 0850 09/04/23 0836         Exercise Comments Reviewed MET's. Pt tolerated exercise well with an average MET level of 7.65. He is doing well and progressing MET's and WL's. He feels very good with his progress so far. Reviewed MET's and goals. Pt tolerated exercise well with an average MET level of 7.8. He is doing well and progressing MET's. He feels a big change and an increase in strength. He feels good about his goals and feels he has a good understanding of Nutrition and Exercise         Exercise Goals and Review:   Exercise Goals     Row Name 06/20/23 1101             Exercise Goals   Increase Physical Activity Yes       Intervention Provide advice, education, support and counseling about physical activity/exercise needs.;Develop an individualized exercise prescription for aerobic and resistive training based on initial evaluation findings, risk stratification, comorbidities and participant's personal goals.       Expected Outcomes Short Term: Attend rehab on a regular basis to increase amount of physical activity.;Long Term: Exercising regularly at least 3-5 days a week.;Long Term: Add in home exercise to make exercise part of routine and to increase amount of physical activity.       Increase Strength and Stamina Yes       Intervention Provide advice, education, support and counseling about physical  activity/exercise needs.;Develop an individualized exercise prescription for aerobic and resistive training based on initial evaluation findings, risk  stratification, comorbidities and participant's personal goals.       Expected Outcomes Short Term: Increase workloads from initial exercise prescription for resistance, speed, and METs.;Short Term: Perform resistance training exercises routinely during rehab and add in resistance training at home;Long Term: Improve cardiorespiratory fitness, muscular endurance and strength as measured by increased METs and functional capacity ( )       Able to understand and use rate of perceived exertion (RPE) scale Yes       Intervention Provide education and explanation on how to use RPE scale       Expected Outcomes Short Term: Able to use RPE daily in rehab to express subjective intensity level;Long Term:  Able to use RPE to guide intensity level when exercising independently       Knowledge and understanding of Target Heart Rate Range (THRR) Yes       Intervention Provide education and explanation of THRR including how the numbers were predicted and where they are located for reference       Expected Outcomes Short Term: Able to state/look up THRR;Long Term: Able to use THRR to govern intensity when exercising independently;Short Term: Able to use daily as guideline for intensity in rehab       Understanding of Exercise Prescription Yes       Intervention Provide education, explanation, and written materials on patient's individual exercise prescription       Expected Outcomes Short Term: Able to explain program exercise prescription;Long Term: Able to explain home exercise prescription to exercise independently          Exercise Goals Re-Evaluation :  Exercise Goals Re-Evaluation     Row Name 06/24/23 0827 07/10/23 0835 08/07/23 0830 09/04/23 0830       Exercise Goal Re-Evaluation   Exercise Goals Review Increase Physical Activity;Understanding of  Exercise Prescription;Increase Strength and Stamina;Knowledge and understanding of Target Heart Rate Range (THRR);Able to understand and use rate of perceived exertion (RPE) scale Increase Physical Activity;Understanding of Exercise Prescription;Increase Strength and Stamina;Knowledge and understanding of Target Heart Rate Range (THRR);Able to understand and use rate of perceived exertion (RPE) scale Increase Physical Activity;Understanding of Exercise Prescription;Increase Strength and Stamina;Knowledge and understanding of Target Heart Rate Range (THRR);Able to understand and use rate of perceived exertion (RPE) scale Increase Physical Activity;Understanding of Exercise Prescription;Increase Strength and Stamina;Knowledge and understanding of Target Heart Rate Range (THRR);Able to understand and use rate of perceived exertion (RPE) scale    Comments Pt first day in teh pr[facility]ikin ICR program. Pt tolerated exercise well with an average MET level of 4.63. Pt is off to a good start, attempted to INC WL but over THRR, will work with pt to get intensities in THRR. Reviewed MET's, goals and home ExRx. Pt tolerated exercise well with an average MET level of 4.9. He is doing well and progressing MET's. He is already feeling an increase in strength and stamina and is increasing his nutrition knowledge. Another goal was to get into a regular exercise routine, he's been doing great here, vitals good so he is planning on adding some days in at home by biking, walking and using some light hand weights. He plans to add in 1-2 days for 30-45 mins a session Reviewed MET's and goals. Pt tolerated exercise well with an average MET level of 6.7. He is doing well and progressing MET's. Working together to [employer] as tolerated by [employer]. He has a fitness tracker at home now and is doing more bike riding and some handweights  at home. He's feeling good and is increasing strength Reviewed MET's and goals. Pt tolerated exercise well  with an average MET level of 7.8. He is doing well and progressing MET's. He feels a big change and an increase in strength. He feels good about his goals and feels he has a good understanding of Nutrition and Exercise    Expected Outcomes Will continue to monitor pt and progress workloads as tolerated without sign or symptom Will continue to monitor pt and progress workloads as tolerated without sign or symptom Will continue to monitor pt and progress workloads as tolerated without sign or symptom Will continue to monitor pt and progress workloads as tolerated without sign or symptom       Discharge Exercise Prescription (Final Exercise Prescription Changes):  Exercise Prescription Changes - 09/04/23 1437       Response to Exercise   Blood Pressure (Admit) 108/70    Blood Pressure (Exit) 104/74    Heart Rate (Admit) 75 bpm    Heart Rate (Exercise) 137 bpm    Heart Rate (Exit) 85 bpm    Rating of Perceived Exertion (Exercise) 8    Perceived Dyspnea (Exercise) 0    Symptoms 0    Comments Reviewed MET's and goals    Duration Progress to 30 minutes of  aerobic without signs/symptoms of physical distress    Intensity THRR unchanged      Progression   Progression Continue to progress workloads to maintain intensity without signs/symptoms of physical distress.    Average METs 7.8      Resistance Training   Training Prescription No    Weight 6    Reps 10-15    Time 10 Minutes      Bike   Level 4.5    Watts 75    Minutes 65    METs 8      Elliptical   Level 4    Speed 3    Minutes 15    METs 7.6      Home Exercise Plan   Plans to continue exercise at Home (comment)    Frequency Add 2 additional days to program exercise sessions.    Initial Home Exercises Provided 07/10/23          Nutrition:  Target Goals: Understanding of nutrition guidelines, daily intake of sodium 1500mg , cholesterol 200mg , calories 30% from fat and 7% or less from saturated fats, daily to have 5 or  more servings of fruits and vegetables.  Biometrics:  Pre Biometrics - 06/20/23 1100       Pre Biometrics   Waist Circumference 37 inches    Hip Circumference 39.5 inches    Waist to Hip Ratio 0.94 %    Triceps Skinfold 10 mm    % Body Fat 23.1 %    Grip Strength 32 kg    Flexibility 12.5 in    Single Leg Stand 15.68 seconds           Nutrition Therapy Plan and Nutrition Goals:  Nutrition Therapy & Goals - 08/28/23 1042       Nutrition Therapy   Diet Heart healthy diet      Personal Nutrition Goals   Nutrition Goal Patient to identify strategies for reducing cardiovascular risk by attending the [facility] education and nutrition series weekly.   Goal not met.   Personal Goal #2 Patient to improve diet quality by using the plate method as a guide for meal planning to include lean protein/plant protein, fruits, vegetables,  whole grains, nonfat dairy as part of a well-balanced diet.   goal in progress   Comments Goals in progress. Christopher Lara has medical history of NSTEMI, CAD, hyperlipidemia.  In addition, he has medical history of panhypopituitarism, hypothyroidism, hyperparathyroidism, hypercalcemia managed by [facility] endocrinology. He is unable to utilize statin therapy due to chronically elevated CK; referred to [facility] for consideration of PCSK9i, LDL goal <55 and lipids are not currently at goal. He has attended some of the [facility] education and nutrition series. He is down 15.6# since starting with our program. Patient will benefit from participation in intensive cardiac rehab for nutrition, exercise, and lifestyle modification.      Intervention Plan   Intervention Prescribe, educate and counsel regarding individualized specific dietary modifications aiming towards targeted core components such as weight, hypertension, lipid management, diabetes, heart failure and other comorbidities.;Nutrition handout(s) given to patient.    Expected Outcomes Short Term Goal: Understand  basic principles of dietary content, such as calories, fat, sodium, cholesterol and nutrients.;Long Term Goal: Adherence to prescribed nutrition plan.          Nutrition Assessments:  Nutrition Assessments - 06/24/23 0840       Rate Your Plate Scores   Pre Score 75         MEDIFICTS Score Key: >=70 Need to make dietary changes  40-70 Heart Healthy Diet <= 40 Therapeutic Level Cholesterol Diet   Flowsheet Row INTENSIVE CARDIAC REHAB from 06/24/2023 in [facility] for Heart, Vascular, & Lung Health  Picture Your Plate Total Score on Admission 75   Picture Your Plate Scores: <59 Unhealthy dietary pattern with much room for improvement. 41-50 Dietary pattern unlikely to meet recommendations for good health and room for improvement. 51-60 More healthful dietary pattern, with some room for improvement.  >60 Healthy dietary pattern, although there may be some specific behaviors that could be improved.    Nutrition Goals Re-Evaluation:  Nutrition Goals Re-Evaluation     Row Name 06/24/23 0836 07/26/23 1156 08/28/23 1042         Goals   Current Weight 163 lb 2.3 oz (74 kg) 158 lb 15.2 oz (72.1 kg) 152 lb 1.9 oz (69 kg)     Comment HDL 35, LDL 117, Lpa 72, A1c WNL no new labs; most recent labs HDL 35, LDL 117, Lpa 72, A1c WNL no new labs; most recent labs HDL 35, LDL 117, Lpa 72, A1c WNL     Expected Outcome Christopher Lara has medical history of NSTEMI, CAD, hyperlipidemia. In addition, he has medical history of panhypopituitarism, hypothyroidism, hyperparathyroidism, hypercalcemia managed by [facility] endocrinology. He is unable to utilize statin therapy due to chronically elevated CK; referred to [facility] for consideration of PCSK9i, LDL goal <55 and lipids are not currently at goal. Patient will benefit from participation in intensive cardiac rehab for nutrition, exercise, and lifestyle modification. Goals in progress. Christopher Lara has medical history of NSTEMI, CAD,  hyperlipidemia. In addition, he has medical history of panhypopituitarism, hypothyroidism, hyperparathyroidism, hypercalcemia managed by [facility] endocrinology. He is unable to utilize statin therapy due to chronically elevated CK; referred to [facility] clinic for consideration of PCSK9i, LDL goal <55 and lipids are not currently at goal. He continues to attend the [facility] education and nutrition series regularly. He is down 8.8# since starting with our program. Patient will benefit from participation in intensive cardiac rehab for nutrition, exercise, and lifestyle modification. Goals in progress. Christopher Lara has medical history of NSTEMI, CAD, hyperlipidemia. In  addition, he has medical history of panhypopituitarism, hypothyroidism, hyperparathyroidism, hypercalcemia managed by [facility] endocrinology. He is unable to utilize statin therapy due to chronically elevated CK; referred to [facility] clinic for consideration of PCSK9i, LDL goal <55 and lipids are not currently at goal. He has attended some of the [facility] education and nutrition series. He is down 15.6# since starting with our program. Patient will benefit from participation in intensive cardiac rehab and adherence to nutrition, exercise, and lifestyle modification.        Nutrition Goals Re-Evaluation:  Nutrition Goals Re-Evaluation     Row Name 06/24/23 0836 07/26/23 1156 08/28/23 1042         Goals   Current Weight 163 lb 2.3 oz (74 kg) 158 lb 15.2 oz (72.1 kg) 152 lb 1.9 oz (69 kg)     Comment HDL 35, LDL 117, Lpa 72, A1c WNL no new labs; most recent labs HDL 35, LDL 117, Lpa 72, A1c WNL no new labs; most recent labs HDL 35, LDL 117, Lpa 72, A1c WNL     Expected Outcome Christopher Lara has medical history of NSTEMI, CAD, hyperlipidemia. In addition, he has medical history of panhypopituitarism, hypothyroidism, hyperparathyroidism, hypercalcemia managed by [facility] endocrinology. He is unable to utilize statin therapy due to chronically elevated CK; referred  to [facility] for consideration of PCSK9i, LDL goal <55 and lipids are not currently at goal. Patient will benefit from participation in intensive cardiac rehab for nutrition, exercise, and lifestyle modification. Goals in progress. Christopher Lara has medical history of NSTEMI, CAD, hyperlipidemia. In addition, he has medical history of panhypopituitarism, hypothyroidism, hyperparathyroidism, hypercalcemia managed by [facility] endocrinology. He is unable to utilize statin therapy due to chronically elevated CK; referred to [facility] clinic for consideration of PCSK9i, LDL goal <55 and lipids are not currently at goal. He continues to attend the [facility] education and nutrition series regularly. He is down 8.8# since starting with our program. Patient will benefit from participation in intensive cardiac rehab for nutrition, exercise, and lifestyle modification. Goals in progress. Christopher Lara has medical history of NSTEMI, CAD, hyperlipidemia. In addition, he has medical history of panhypopituitarism, hypothyroidism, hyperparathyroidism, hypercalcemia managed by [facility] endocrinology. He is unable to utilize statin therapy due to chronically elevated CK; referred to [facility] for consideration of PCSK9i, LDL goal <55 and lipids are not currently at goal. He has attended some of the Prit[facility]in education and nutrition series. He is down 15.6# since starting with our program. Patient will benefit from participation in intensive cardiac rehab and adherence to nutrition, exercise, and lifestyle modification.        Nutrition Goals Discharge (Final Nutrition Goals Re-Evaluation):  Nutrition Goals Re-Evaluation - 08/28/23 1042       Goals   Current Weight 152 lb 1.9 oz (69 kg)    Comment no new labs; most recent labs HDL 35, LDL 117, Lpa 72, A1c WNL    Expected Outcome Goals in progress. Christopher Lara has medical history of NSTEMI, CAD, hyperlipidemia. In addition, he has medical history of panhypopituitarism, hypothyroidism,  hyperparathyroidism, hypercalcemia managed by [facility] endocrinology. He is unable to utilize statin therapy due to chronically elevated CK; referred to [facility] clinic for consideration of PCSK9i, LDL goal <55 and lipids are not currently at goal. He has attended some of the Pritikin education and nutrition series. He is down 15.6# since starting with our program. Patient will benefit from participation in intensive cardiac rehab and adherence to nutrition, exercise, and lifestyle modification.  Psychosocial: Target Goals: Acknowledge presence or absence of significant depression and/or stress, maximize coping skills, provide positive support system. Participant is able to verbalize types and ability to use techniques and skills needed for reducing stress and depression.  Initial Review & Psychosocial Screening:  Initial Psych Review & Screening - 06/27/23 1513       Initial Review   Current issues with None Identified      Family Dynamics   Good Support System? Yes      Barriers   Psychosocial barriers to participate in program There are no identifiable barriers or psychosocial needs.      Screening Interventions   Interventions Encouraged to exercise;To provide support and resources with identified psychosocial needs;Provide feedback about the scores to participant    Expected Outcomes Short Term goal: Utilizing psychosocial counselor, staff and physician to assist with identification of specific Stressors or current issues interfering with healing process. Setting desired goal for each stressor or current issue identified.;Long Term Goal: Stressors or current issues are controlled or eliminated.;Short Term goal: Identification and review with participant of any Quality of Life or Depression concerns found by scoring the questionnaire.;Long Term goal: The participant improves quality of Life and PHQ9 Scores as seen by post scores and/or verbalization of changes           Quality of Life Scores:  Quality of Life - 06/20/23 1342       Quality of Life   Select Quality of Life      Quality of Life Scores   Health/Function Pre 23.43 %    Socioeconomic Pre 26.43 %    Psych/Spiritual Pre 24.86 %    Family Pre 27 %    GLOBAL Pre 24.8 %         Scores of 19 and below usually indicate a poorer quality of life in these areas.  A difference of  2-3 points is a clinically meaningful difference.  A difference of 2-3 points in the total score of the Quality of Life Index has been associated with significant improvement in overall quality of life, self-image, physical symptoms, and general health in studies assessing change in quality of life.  PHQ-9: Review Flowsheet       06/20/2023 05/16/2022 04/03/2021 03/26/2019  Depression screen PHQ 2/9  Decreased Interest 1 0 0 0  Down, Depressed, Hopeless 0 0 0 0  PHQ - 2 Score 1 0 0 0  Altered sleeping 0 - 0 -  Tired, decreased energy 1 - 0 -  Change in appetite 0 - 0 -  Feeling bad or failure about yourself  0 - 0 -  Trouble concentrating 0 - 0 -  Moving slowly or fidgety/restless 0 - 0 -  Suicidal thoughts 0 - 0 -  PHQ-9 Score 2 - 0 -  Difficult doing work/chores Somewhat difficult - - -   Interpretation of Total Score  Total Score Depression Severity:  1-4 = Minimal depression, 5-9 = Mild depression, 10-14 = Moderate depression, 15-19 = Moderately severe depression, 20-27 = Severe depression   Psychosocial Evaluation and Intervention:   Psychosocial Re-Evaluation:  Psychosocial Re-Evaluation     Row Name 07/12/23 1326 08/08/23 9177 09/03/23 1129         Psychosocial Re-Evaluation   Current issues with None Identified None Identified None Identified     Interventions Encouraged to attend Cardiac Rehabilitation for the exercise Encouraged to attend Cardiac Rehabilitation for the exercise Encouraged to attend Cardiac Rehabilitation for the exercise  Continue Psychosocial Services  No Follow up  required No Follow up required No Follow up required        Psychosocial Discharge (Final Psychosocial Re-Evaluation):  Psychosocial Re-Evaluation - 09/03/23 1129       Psychosocial Re-Evaluation   Current issues with None Identified    Interventions Encouraged to attend Cardiac Rehabilitation for the exercise    Continue Psychosocial Services  No Follow up required          Vocational Rehabilitation: Provide vocational rehab assistance to qualifying candidates.   Vocational Rehab Evaluation & Intervention:  Vocational Rehab - 06/20/23 1113       Initial Vocational Rehab Evaluation & Intervention   Assessment shows need for Vocational Rehabilitation No   Currently working         Education: Education Goals: Education classes will be provided on a weekly basis, covering required topics. Participant will state understanding/return demonstration of topics presented.    Education     Row Name 06/24/23 0800     Education   Cardiac Education Topics Pritikin   Geographical information systems officer Psychosocial   Psychosocial Workshop From Head to Heart: The Power of a Healthy Outlook   Instruction Review Code 1- Verbalizes Understanding   Class Start Time 0815   Class Stop Time 0900   Class Time Calculation (min) 45 min    Row Name 06/26/23 0900     Education   Cardiac Education Topics Pritikin   Secondary school teacher School   Educator Dietitian   Weekly Topic Tasty Appetizers and Snacks   Instruction Review Code 1- Verbalizes Understanding   Class Start Time 0815   Class Stop Time 0855   Class Time Calculation (min) 40 min    Row Name 06/28/23 0800     Education   Cardiac Education Topics Pritikin   Psychologist, forensic General Education   General Education Heart Disease Risk Reduction   Instruction Review Code 1- Verbalizes Understanding   Class  Start Time 0813   Class Stop Time 0850   Class Time Calculation (min) 37 min    Row Name 07/01/23 0900     Education   Cardiac Education Topics Pritikin   Select Core Videos     Core Videos   Educator Exercise Physiologist   Select Psychosocial   Psychosocial Healthy Minds, Bodies, Hearts   Instruction Review Code 1- Verbalizes Understanding   Class Start Time 0815   Class Stop Time 0850   Class Time Calculation (min) 35 min    Row Name 07/05/23 0900     Education   Cardiac Education Topics Pritikin   Select Workshops     Workshops   Educator Exercise Physiologist   Select Exercise   Exercise Workshop Location manager and Fall Prevention   Instruction Review Code 1- Verbalizes Understanding   Class Start Time 0810   Class Stop Time 0850   Class Time Calculation (min) 40 min    Row Name 07/08/23 0900     Education   Cardiac Education Topics Pritikin   Glass blower/designer Nutrition   Nutrition Workshop Label Reading   Instruction Review Code 1- Verbalizes Understanding   Class Start Time 0815   Class Stop Time 0900   Class Time Calculation (  min) 45 min    Row Name 07/10/23 0900     Education   Cardiac Education Topics Pritikin   Select Core Videos     Core Videos   Educator Exercise Physiologist   Select Nutrition   Nutrition Other  Label reading   Instruction Review Code 1- Verbalizes Understanding   Class Start Time 0813   Class Stop Time 0844   Class Time Calculation (min) 31 min    Row Name 07/15/23 0700     Education   Cardiac Education Topics Pritikin   Select Core Videos     Core Videos   Educator Exercise Physiologist   Select General Education   General Education Metabolic Syndrome and Belly Fat   Instruction Review Code 1- Verbalizes Understanding   Class Start Time 0815   Class Stop Time 0851   Class Time Calculation (min) 36 min    Row Name 07/17/23 1000     Education   Cardiac  Education Topics Pritikin     Cooking School   Educator Dietitian   Weekly Topic Personalizing Your Pritikin Plate   Instruction Review Code 1- Verbalizes Understanding   Class Start Time 0815   Class Stop Time 0850   Class Time Calculation (min) 35 min    Row Name 07/19/23 0800     Education   Cardiac Education Topics Pritikin   Select Core Videos     Core Videos   Educator Dietitian   Select Nutrition   Nutrition Overview of the Pritikin Eating Plan   Instruction Review Code 1- Verbalizes Understanding   Class Start Time 0815   Class Stop Time 0900   Class Time Calculation (min) 45 min    Row Name 07/26/23 0800     Education   Cardiac Education Topics Pritikin   Select Core Videos     Core Videos   Educator Dietitian   Select Nutrition   Nutrition Calorie Density   Instruction Review Code 1- Verbalizes Understanding   Class Start Time 0815   Class Stop Time 0855   Class Time Calculation (min) 40 min    Row Name 07/29/23 0800     Education   Cardiac Education Topics Pritikin   Select Workshops     Workshops   Educator Exercise Physiologist   Select Exercise   Exercise Workshop Exercise Basics: Building Your Action Plan   Instruction Review Code 1- Verbalizes Understanding   Class Start Time 0810   Class Stop Time 0853   Class Time Calculation (min) 43 min    Row Name 08/02/23 0900     Education   Cardiac Education Topics Pritikin   Psychologist, forensic Exercise Education   Exercise Education Move It!   Instruction Review Code 1- Verbalizes Understanding   Class Start Time [phone]   Class Stop Time 0845   Class Time Calculation (min) 35 min      Core Videos: Exercise    Move It!  Clinical staff conducted group or individual video education with verbal and written material and guidebook.  Patient learns the recommended Pritikin exercise program. Exercise with the goal of living a long,  healthy life. Some of the health benefits of exercise include controlled diabetes, healthier blood pressure levels, improved cholesterol levels, improved heart and lung capacity, improved sleep, and better body composition. Everyone should speak with their doctor before starting or changing an exercise routine.  Biomechanical Limitations  Clinical staff conducted group or individual video education with verbal and written material and guidebook.  Patient learns how biomechanical limitations can impact exercise and how we can mitigate and possibly overcome limitations to have an impactful and balanced exercise routine.  Body Composition Clinical staff conducted group or individual video education with verbal and written material and guidebook.  Patient learns that body composition (ratio of muscle mass to fat mass) is a key component to assessing overall fitness, rather than body weight alone. Increased fat mass, especially visceral belly fat, can put us  at increased risk for metabolic syndrome, type 2 diabetes, heart disease, and even death. It is recommended to combine diet and exercise (cardiovascular and resistance training) to improve your body composition. Seek guidance from your physician and exercise physiologist before implementing an exercise routine.  Exercise Action Plan Clinical staff conducted group or individual video education with verbal and written material and guidebook.  Patient learns the recommended strategies to achieve and enjoy long-term exercise adherence, including variety, self-motivation, self-efficacy, and positive decision making. Benefits of exercise include fitness, good health, weight management, more energy, better sleep, less stress, and overall well-being.  Medical   Heart Disease Risk Reduction Clinical staff conducted group or individual video education with verbal and written material and guidebook.  Patient learns our heart is our most vital organ as it  circulates oxygen, nutrients, white blood cells, and hormones throughout the entire body, and carries waste away. Data supports a plant-based eating plan like the Pritikin Program for its effectiveness in slowing progression of and reversing heart disease. The video provides a number of recommendations to address heart disease.   Metabolic Syndrome and Belly Fat  Clinical staff conducted group or individual video education with verbal and written material and guidebook.  Patient learns what metabolic syndrome is, how it leads to heart disease, and how one can reverse it and keep it from coming back. You have metabolic syndrome if you have 3 of the following 5 criteria: abdominal obesity, high blood pressure, high triglycerides, low HDL cholesterol, and high blood sugar.  Hypertension and Heart Disease Clinical staff conducted group or individual video education with verbal and written material and guidebook.  Patient learns that high blood pressure, or hypertension, is very common in the [place] . Hypertension is largely due to excessive salt intake, but other important risk factors include being overweight, physical inactivity, drinking too much alcohol, smoking, and not eating enough potassium from fruits and vegetables. High blood pressure is a leading risk factor for heart attack, stroke, congestive heart failure, dementia, kidney failure, and premature death. Long-term effects of excessive salt intake include stiffening of the arteries and thickening of heart muscle and organ damage. Recommendations include ways to reduce hypertension and the risk of heart disease.  Diseases of Our Time - Focusing on Diabetes Clinical staff conducted group or individual video education with verbal and written material and guidebook.  Patient learns why the best way to stop diseases of our time is prevention, through food and other lifestyle changes. Medicine (such as prescription pills and surgeries) is often  only a Band-Aid on the problem, not a long-term solution. Most common diseases of our time include obesity, type 2 diabetes, hypertension, heart disease, and cancer. The Pritikin Program is recommended and has been proven to help reduce, reverse, and/or prevent the damaging effects of metabolic syndrome.  Nutrition   Overview of the Pritikin Eating Plan  Clinical staff conducted group or individual video education with verbal and  written material and guidebook.  Patient learns about the Pritikin Eating Plan for disease risk reduction. The Pritikin Eating Plan emphasizes a wide variety of unrefined, minimally-processed carbohydrates, like fruits, vegetables, whole grains, and legumes. Go, Caution, and Stop food choices are explained. Plant-based and lean animal proteins are emphasized. Rationale provided for low sodium intake for blood pressure control, low added sugars for blood sugar stabilization, and low added fats and oils for coronary artery disease risk reduction and weight management.  Calorie Density  Clinical staff conducted group or individual video education with verbal and written material and guidebook.  Patient learns about calorie density and how it impacts the Pritikin Eating Plan. Knowing the characteristics of the food you choose will help you decide whether those foods will lead to weight gain or weight loss, and whether you want to consume more or less of them. Weight loss is usually a side effect of the Pritikin Eating Plan because of its focus on low calorie-dense foods.  Label Reading  Clinical staff conducted group or individual video education with verbal and written material and guidebook.  Patient learns about the Pritikin recommended label reading guidelines and corresponding recommendations regarding calorie density, added sugars, sodium content, and whole grains.  Dining Out - Part 1  Clinical staff conducted group or individual video education with verbal and written  material and guidebook.  Patient learns that restaurant meals can be sabotaging because they can be so high in calories, fat, sodium, and/or sugar. Patient learns recommended strategies on how to positively address this and avoid unhealthy pitfalls.  Facts on Fats  Clinical staff conducted group or individual video education with verbal and written material and guidebook.  Patient learns that lifestyle modifications can be just as effective, if not more so, as many medications for lowering your risk of heart disease. A Pritikin lifestyle can help to reduce your risk of inflammation and atherosclerosis (cholesterol build-up, or plaque, in the artery walls). Lifestyle interventions such as dietary choices and physical activity address the cause of atherosclerosis. A review of the types of fats and their impact on blood cholesterol levels, along with dietary recommendations to reduce fat intake is also included.  Nutrition Action Plan  Clinical staff conducted group or individual video education with verbal and written material and guidebook.  Patient learns how to incorporate Pritikin recommendations into their lifestyle. Recommendations include planning and keeping personal health goals in mind as an important part of their success.  Healthy Mind-Set    Healthy Minds, Bodies, Hearts  Clinical staff conducted group or individual video education with verbal and written material and guidebook.  Patient learns how to identify when they are stressed. Video will discuss the impact of that stress, as well as the many benefits of stress management. Patient will also be introduced to stress management techniques. The way we think, act, and feel has an impact on our hearts.  How Our Thoughts Can Heal Our Hearts  Clinical staff conducted group or individual video education with verbal and written material and guidebook.  Patient learns that negative thoughts can cause depression and anxiety. This can result in  negative lifestyle behavior and serious health problems. Cognitive behavioral therapy is an effective method to help control our thoughts in order to change and improve our emotional outlook.  Additional Videos:  Exercise    Improving Performance  Clinical staff conducted group or individual video education with verbal and written material and guidebook.  Patient learns to use a non-linear approach by  alternating intensity levels and lengths of time spent exercising to help burn more calories and lose more body fat. Cardiovascular exercise helps improve heart health, metabolism, hormonal balance, blood sugar control, and recovery from fatigue. Resistance training improves strength, endurance, balance, coordination, reaction time, metabolism, and muscle mass. Flexibility exercise improves circulation, posture, and balance. Seek guidance from your physician and exercise physiologist before implementing an exercise routine and learn your capabilities and proper form for all exercise.  Introduction to Yoga  Clinical staff conducted group or individual video education with verbal and written material and guidebook.  Patient learns about yoga, a discipline of the coming together of mind, breath, and body. The benefits of yoga include improved flexibility, improved range of motion, better posture and core strength, increased lung function, weight loss, and positive self-image. Yoga's heart health benefits include lowered blood pressure, healthier heart rate, decreased cholesterol and triglyceride levels, improved immune function, and reduced stress. Seek guidance from your physician and exercise physiologist before implementing an exercise routine and learn your capabilities and proper form for all exercise.  Medical   Aging: Enhancing Your Quality of Life  Clinical staff conducted group or individual video education with verbal and written material and guidebook.  Patient learns key strategies and  recommendations to stay in good physical health and enhance quality of life, such as prevention strategies, having an advocate, securing a Health Care Proxy and Power of Attorney, and keeping a list of medications and system for tracking them. It also discusses how to avoid risk for bone loss.  Biology of Weight Control  Clinical staff conducted group or individual video education with verbal and written material and guidebook.  Patient learns that weight gain occurs because we consume more calories than we burn (eating more, moving less). Even if your body weight is normal, you may have higher ratios of fat compared to muscle mass. Too much body fat puts you at increased risk for cardiovascular disease, heart attack, stroke, type 2 diabetes, and obesity-related cancers. In addition to exercise, following the Pritikin Eating Plan can help reduce your risk.  Decoding Lab Results  Clinical staff conducted group or individual video education with verbal and written material and guidebook.  Patient learns that lab test reflects one measurement whose values change over time and are influenced by many factors, including medication, stress, sleep, exercise, food, hydration, pre-existing medical conditions, and more. It is recommended to use the knowledge from this video to become more involved with your lab results and evaluate your numbers to speak with your doctor.   Diseases of Our Time - Overview  Clinical staff conducted group or individual video education with verbal and written material and guidebook.  Patient learns that according to the CDC, 50% to 70% of chronic diseases (such as obesity, type 2 diabetes, elevated lipids, hypertension, and heart disease) are avoidable through lifestyle improvements including healthier food choices, listening to satiety cues, and increased physical activity.  Sleep Disorders Clinical staff conducted group or individual video education with verbal and written  material and guidebook.  Patient learns how good quality and duration of sleep are important to overall health and well-being. Patient also learns about sleep disorders and how they impact health along with recommendations to address them, including discussing with a physician.  Nutrition  Dining Out - Part 2 Clinical staff conducted group or individual video education with verbal and written material and guidebook.  Patient learns how to plan ahead and communicate in order to maximize their dining  experience in a healthy and nutritious manner. Included are recommended food choices based on the type of restaurant the patient is visiting.   Fueling a Banker conducted group or individual video education with verbal and written material and guidebook.  There is a strong connection between our food choices and our health. Diseases like obesity and type 2 diabetes are very prevalent and are in large-part due to lifestyle choices. The Pritikin Eating Plan provides plenty of food and hunger-curbing satisfaction. It is easy to follow, affordable, and helps reduce health risks.  Menu Workshop  Clinical staff conducted group or individual video education with verbal and written material and guidebook.  Patient learns that restaurant meals can sabotage health goals because they are often packed with calories, fat, sodium, and sugar. Recommendations include strategies to plan ahead and to communicate with the manager, chef, or server to help order a healthier meal.  Planning Your Eating Strategy  Clinical staff conducted group or individual video education with verbal and written material and guidebook.  Patient learns about the Pritikin Eating Plan and its benefit of reducing the risk of disease. The Pritikin Eating Plan does not focus on calories. Instead, it emphasizes high-quality, nutrient-rich foods. By knowing the characteristics of the foods, we choose, we can determine their  calorie density and make informed decisions.  Targeting Your Nutrition Priorities  Clinical staff conducted group or individual video education with verbal and written material and guidebook.  Patient learns that lifestyle habits have a tremendous impact on disease risk and progression. This video provides eating and physical activity recommendations based on your personal health goals, such as reducing LDL cholesterol, losing weight, preventing or controlling type 2 diabetes, and reducing high blood pressure.  Vitamins and Minerals  Clinical staff conducted group or individual video education with verbal and written material and guidebook.  Patient learns different ways to obtain key vitamins and minerals, including through a recommended healthy diet. It is important to discuss all supplements you take with your doctor.   Healthy Mind-Set    Smoking Cessation  Clinical staff conducted group or individual video education with verbal and written material and guidebook.  Patient learns that cigarette smoking and tobacco addiction pose a serious health risk which affects millions of people. Stopping smoking will significantly reduce the risk of heart disease, lung disease, and many forms of cancer. Recommended strategies for quitting are covered, including working with your doctor to develop a successful plan.  Culinary   Becoming a Set designer conducted group or individual video education with verbal and written material and guidebook.  Patient learns that cooking at home can be healthy, cost-effective, quick, and puts them in control. Keys to cooking healthy recipes will include looking at your recipe, assessing your equipment needs, planning ahead, making it simple, choosing cost-effective seasonal ingredients, and limiting the use of added fats, salts, and sugars.  Cooking - Breakfast and Snacks  Clinical staff conducted group or individual video education with verbal and  written material and guidebook.  Patient learns how important breakfast is to satiety and nutrition through the entire day. Recommendations include key foods to eat during breakfast to help stabilize blood sugar levels and to prevent overeating at meals later in the day. Planning ahead is also a key component.  Cooking - Educational psychologist conducted group or individual video education with verbal and written material and guidebook.  Patient learns eating strategies to improve overall health,  including an approach to cook more at home. Recommendations include thinking of animal protein as a side on your plate rather than center stage and focusing instead on lower calorie dense options like vegetables, fruits, whole grains, and plant-based proteins, such as beans. Making sauces in large quantities to freeze for later and leaving the skin on your vegetables are also recommended to maximize your experience.  Cooking - Healthy Salads and Dressing Clinical staff conducted group or individual video education with verbal and written material and guidebook.  Patient learns that vegetables, fruits, whole grains, and legumes are the foundations of the Pritikin Eating Plan. Recommendations include how to incorporate each of these in flavorful and healthy salads, and how to create homemade salad dressings. Proper handling of ingredients is also covered. Cooking - Soups and [employer] - Soups and Desserts Clinical staff conducted group or individual video education with verbal and written material and guidebook.  Patient learns that Pritikin soups and desserts make for easy, nutritious, and delicious snacks and meal components that are low in sodium, fat, sugar, and calorie density, while high in vitamins, minerals, and filling fiber. Recommendations include simple and healthy ideas for soups and desserts.   Overview     The Pritikin Solution Program Overview Clinical staff conducted group or  individual video education with verbal and written material and guidebook.  Patient learns that the results of the Pritikin Program have been documented in more than 100 articles published in peer-reviewed journals, and the benefits include reducing risk factors for (and, in some cases, even reversing) high cholesterol, high blood pressure, type 2 diabetes, obesity, and more! An overview of the three key pillars of the Pritikin Program will be covered: eating well, doing regular exercise, and having a healthy mind-set.  WORKSHOPS  Exercise: Exercise Basics: Building Your Action Plan Clinical staff led group instruction and group discussion with PowerPoint presentation and patient guidebook. To enhance the learning environment the use of posters, models and videos may be added. At the conclusion of this workshop, patients will comprehend the difference between physical activity and exercise, as well as the benefits of incorporating both, into their routine. Patients will understand the FITT (Frequency, Intensity, Time, and Type) principle and how to use it to build an exercise action plan. In addition, safety concerns and other considerations for exercise and cardiac rehab will be addressed by the presenter. The purpose of this lesson is to promote a comprehensive and effective weekly exercise routine in order to improve patients' overall level of fitness.   Managing Heart Disease: Your Path to a Healthier Heart Clinical staff led group instruction and group discussion with PowerPoint presentation and patient guidebook. To enhance the learning environment the use of posters, models and videos may be added.At the conclusion of this workshop, patients will understand the anatomy and physiology of the heart. Additionally, they will understand how Pritikin's three pillars impact the risk factors, the progression, and the management of heart disease.  The purpose of this lesson is to provide a high-level  overview of the heart, heart disease, and how the Pritikin lifestyle positively impacts risk factors.  Exercise Biomechanics Clinical staff led group instruction and group discussion with PowerPoint presentation and patient guidebook. To enhance the learning environment the use of posters, models and videos may be added. Patients will learn how the structural parts of their bodies function and how these functions impact their daily activities, movement, and exercise. Patients will learn how to promote a neutral spine, learn  how to manage pain, and identify ways to improve their physical movement in order to promote healthy living. The purpose of this lesson is to expose patients to common physical limitations that impact physical activity. Participants will learn practical ways to adapt and manage aches and pains, and to minimize their effect on regular exercise. Patients will learn how to maintain good posture while sitting, walking, and lifting.  Balance Training and Fall Prevention  Clinical staff led group instruction and group discussion with PowerPoint presentation and patient guidebook. To enhance the learning environment the use of posters, models and videos may be added. At the conclusion of this workshop, patients will understand the importance of their sensorimotor skills (vision, proprioception, and the vestibular system) in maintaining their ability to balance as they age. Patients will apply a variety of balancing exercises that are appropriate for their current level of function. Patients will understand the common causes for poor balance, possible solutions to these problems, and ways to modify their physical environment in order to minimize their fall risk. The purpose of this lesson is to teach patients about the importance of maintaining balance as they age and ways to minimize their risk of falling.  WORKSHOPS   Nutrition:  Fueling a Ship broker led group  instruction and group discussion with PowerPoint presentation and patient guidebook. To enhance the learning environment the use of posters, models and videos may be added. Patients will review the foundational principles of the Pritikin Eating Plan and understand what constitutes a serving size in each of the food groups. Patients will also learn Pritikin-friendly foods that are better choices when away from home and review make-ahead meal and snack options. Calorie density will be reviewed and applied to three nutrition priorities: weight maintenance, weight loss, and weight gain. The purpose of this lesson is to reinforce (in a group setting) the key concepts around what patients are recommended to eat and how to apply these guidelines when away from home by planning and selecting Pritikin-friendly options. Patients will understand how calorie density may be adjusted for different weight management goals.  Mindful Eating  Clinical staff led group instruction and group discussion with PowerPoint presentation and patient guidebook. To enhance the learning environment the use of posters, models and videos may be added. Patients will briefly review the concepts of the Pritikin Eating Plan and the importance of low-calorie dense foods. The concept of mindful eating will be introduced as well as the importance of paying attention to internal hunger signals. Triggers for non-hunger eating and techniques for dealing with triggers will be explored. The purpose of this lesson is to provide patients with the opportunity to review the basic principles of the Pritikin Eating Plan, discuss the value of eating mindfully and how to measure internal cues of hunger and fullness using the Hunger Scale. Patients will also discuss reasons for non-hunger eating and learn strategies to use for controlling emotional eating.  Targeting Your Nutrition Priorities Clinical staff led group instruction and group discussion with  PowerPoint presentation and patient guidebook. To enhance the learning environment the use of posters, models and videos may be added. Patients will learn how to determine their genetic susceptibility to disease by reviewing their family history. Patients will gain insight into the importance of diet as part of an overall healthy lifestyle in mitigating the impact of genetics and other environmental insults. The purpose of this lesson is to provide patients with the opportunity to assess their personal nutrition priorities by looking  at their family history, their own health history and current risk factors. Patients will also be able to discuss ways of prioritizing and modifying the Pritikin Eating Plan for their highest risk areas  Menu  Clinical staff led group instruction and group discussion with PowerPoint presentation and patient guidebook. To enhance the learning environment the use of posters, models and videos may be added. Using menus brought in from [employer], or printed from [employer], patients will apply the Pritikin dining out guidelines that were presented in the [employer] video. Patients will also be able to practice these guidelines in a variety of provided scenarios. The purpose of this lesson is to provide patients with the opportunity to practice hands-on learning of the Pritikin Dining Out guidelines with actual menus and practice scenarios.  Label Reading Clinical staff led group instruction and group discussion with PowerPoint presentation and patient guidebook. To enhance the learning environment the use of posters, models and videos may be added. Patients will review and discuss the Pritikin label reading guidelines presented in Pritikin's Label Reading Educational series video. Using fool labels brought in from local grocery stores and markets, patients will apply the label reading guidelines and determine if the packaged food meet the Pritikin  guidelines. The purpose of this lesson is to provide patients with the opportunity to review, discuss, and practice hands-on learning of the Pritikin Label Reading guidelines with actual packaged food labels. Cooking School  Pritikin's [employer] are designed to teach patients ways to prepare quick, simple, and affordable recipes at home. The importance of nutrition's role in chronic disease risk reduction is reflected in its emphasis in the overall Pritikin program. By learning how to prepare essential core Pritikin Eating Plan recipes, patients will increase control over what they eat; be able to customize the flavor of foods without the use of added salt, sugar, or fat; and improve the quality of the food they consume. By learning a set of core recipes which are easily assembled, quickly prepared, and affordable, patients are more likely to prepare more healthy foods at home. These workshops focus on convenient breakfasts, simple entres, side dishes, and desserts which can be prepared with minimal effort and are consistent with nutrition recommendations for cardiovascular risk reduction. Cooking [employer] are taught by a Armed forces logistics/support/administrative officer (RD) who has been trained by the [employer]. The chef or RD has a clear understanding of the importance of minimizing - if not completely eliminating - added fat, sugar, and sodium in recipes. Throughout the series of Cooking School Workshop sessions, patients will learn about healthy ingredients and efficient methods of cooking to build confidence in their capability to prepare    Cooking School weekly topics:  Adding Flavor- Sodium-Free  Fast and Healthy Breakfasts  Powerhouse Plant-Based Proteins  Satisfying Salads and Dressings  Simple Sides and Sauces  International Cuisine-Spotlight on the [employer] Zones  Delicious Desserts  Savory Soups  [employer] - Meals in a Astronomer Appetizers and  Snacks  Comforting Weekend Breakfasts  One-Pot Wonders   Fast Evening Meals  Landscape architect Your Pritikin Plate  WORKSHOPS   Healthy Mindset (Psychosocial):  Focused Goals, Sustainable Changes Clinical staff led group instruction and group discussion with PowerPoint presentation and patient guidebook. To enhance the learning environment the use of posters, models and videos may be added. Patients will be able to apply effective goal setting strategies to establish at least one personal goal, and  then take consistent, meaningful action toward that goal. They will learn to identify common barriers to achieving personal goals and develop strategies to overcome them. Patients will also gain an understanding of how our mind-set can impact our ability to achieve goals and the importance of cultivating a positive and growth-oriented mind-set. The purpose of this lesson is to provide patients with a deeper understanding of how to set and achieve personal goals, as well as the tools and strategies needed to overcome common obstacles which may arise along the way.  From Head to Heart: The Power of a Healthy Outlook  Clinical staff led group instruction and group discussion with PowerPoint presentation and patient guidebook. To enhance the learning environment the use of posters, models and videos may be added. Patients will be able to recognize and describe the impact of emotions and mood on physical health. They will discover the importance of self-care and explore self-care practices which may work for them. Patients will also learn how to utilize the 4 C's to cultivate a healthier outlook and better manage stress and challenges. The purpose of this lesson is to demonstrate to patients how a healthy outlook is an essential part of maintaining good health, especially as they continue their cardiac rehab journey.  Healthy Sleep for a Healthy Heart Clinical staff led group instruction and  group discussion with PowerPoint presentation and patient guidebook. To enhance the learning environment the use of posters, models and videos may be added. At the conclusion of this workshop, patients will be able to demonstrate knowledge of the importance of sleep to overall health, well-being, and quality of life. They will understand the symptoms of, and treatments for, common sleep disorders. Patients will also be able to identify daytime and nighttime behaviors which impact sleep, and they will be able to apply these tools to help manage sleep-related challenges. The purpose of this lesson is to provide patients with a general overview of sleep and outline the importance of quality sleep. Patients will learn about a few of the most common sleep disorders. Patients will also be introduced to the concept of "sleep hygiene," and discover ways to self-manage certain sleeping problems through simple daily behavior changes. Finally, the workshop will motivate patients by clarifying the links between quality sleep and their goals of heart-healthy living.   Recognizing and Reducing Stress Clinical staff led group instruction and group discussion with PowerPoint presentation and patient guidebook. To enhance the learning environment the use of posters, models and videos may be added. At the conclusion of this workshop, patients will be able to understand the types of stress reactions, differentiate between acute and chronic stress, and recognize the impact that chronic stress has on their health. They will also be able to apply different coping mechanisms, such as reframing negative self-talk. Patients will have the opportunity to practice a variety of stress management techniques, such as deep abdominal breathing, progressive muscle relaxation, and/or guided imagery.  The purpose of this lesson is to educate patients on the role of stress in their lives and to provide healthy techniques for coping with  it.  Learning Barriers/Preferences:  Learning Barriers/Preferences - 06/20/23 1112       Learning Barriers/Preferences   Learning Barriers None    Learning Preferences Group Instruction;Individual Instruction;Skilled Demonstration;Verbal Instruction          Education Topics:  Knowledge Questionnaire Score:  Knowledge Questionnaire Score - 06/20/23 1112       Knowledge Questionnaire Score   Pre Score 20/24  Core Components/Risk Factors/Patient Goals at Admission:  Personal Goals and Risk Factors at Admission - 06/20/23 1342       Core Components/Risk Factors/Patient Goals on Admission    Weight Management Yes;Weight Loss    Intervention Weight Management: Develop a combined nutrition and exercise program designed to reach desired caloric intake, while maintaining appropriate intake of nutrient and fiber, sodium and fats, and appropriate energy expenditure required for the weight goal.;Weight Management: Provide education and appropriate resources to help participant work on and attain dietary goals.    Expected Outcomes Short Term: Continue to assess and modify interventions until short term weight is achieved;Long Term: Adherence to nutrition and physical activity/exercise program aimed toward attainment of established weight goal;Weight Maintenance: Understanding of the daily nutrition guidelines, which includes 25-35% calories from fat, 7% or less cal from saturated fats, less than 200mg  cholesterol, less than 1.5gm of sodium, & 5 or more servings of fruits and vegetables daily;Weight Loss: Understanding of general recommendations for a balanced deficit meal plan, which promotes 1-2 lb weight loss per week and includes a negative energy balance of [phone] kcal/d;Understanding recommendations for meals to include 15-35% energy as protein, 25-35% energy from fat, 35-60% energy from carbohydrates, less than 200mg  of dietary cholesterol, 20-35 gm of total fiber  daily;Understanding of distribution of calorie intake throughout the day with the consumption of 4-5 meals/snacks    Hypertension Yes    Intervention Provide education on lifestyle modifcations including regular physical activity/exercise, weight management, moderate sodium restriction and increased consumption of fresh fruit, vegetables, and low fat dairy, alcohol moderation, and smoking cessation.;Monitor prescription use compliance.    Expected Outcomes Long Term: Maintenance of blood pressure at goal levels.;Short Term: Continued assessment and intervention until BP is < 140/76mm HG in hypertensive participants. < 130/58mm HG in hypertensive participants with diabetes, heart failure or chronic kidney disease.    Lipids Yes    Intervention Provide education and support for participant on nutrition & aerobic/resistive exercise along with prescribed medications to achieve LDL 70mg , HDL >40mg .    Expected Outcomes Long Term: Cholesterol controlled with medications as prescribed, with individualized exercise RX and with personalized nutrition plan. Value goals: LDL < 70mg , HDL > 40 mg.;Short Term: Participant states understanding of desired cholesterol values and is compliant with medications prescribed. Participant is following exercise prescription and nutrition guidelines.          Core Components/Risk Factors/Patient Goals Review:   Goals and Risk Factor Review     Row Name 07/12/23 1326 08/08/23 9177 09/03/23 1130         Core Components/Risk Factors/Patient Goals Review   Personal Goals Review Weight Management/Obesity;Hypertension;Lipids Weight Management/Obesity;Hypertension;Lipids Weight Management/Obesity;Hypertension;Lipids     Review Christopher Lara is doing very well with exercise at cardiac rehab. Christopher Lara has increased his met levels and has lost 4.0 kg since starting the program. Christopher Lara continues to do  very well with exercise at cardiac rehab. Christopher Lara continues to  increase his met levels and has  lost 5.1  kg since starting the program. Christopher Lara continues to do  very well with exercise at cardiac rehab. Christopher Lara continues to  increase his met levels and has lost 6.7   kg since starting the program. Christopher Lara will complete cardiac rehab tenatively on 09/09/23.     Expected Outcomes Christopher Lara will continue to participate in cardiac rehab for exercise, nutrtion and lifestyle modifications Christopher Lara will continue to participate in cardiac rehab for exercise, nutrtion and lifestyle modifications Christopher Lara will continue to participate in cardiac  rehab for exercise, nutrtion and lifestyle modifications        Core Components/Risk Factors/Patient Goals at Discharge (Final Review):   Goals and Risk Factor Review - 09/03/23 1130       Core Components/Risk Factors/Patient Goals Review   Personal Goals Review Weight Management/Obesity;Hypertension;Lipids    Review Christopher Lara continues to do  very well with exercise at cardiac rehab. Christopher Lara continues to  increase his met levels and has lost 6.7   kg since starting the program. Christopher Lara will complete cardiac rehab tenatively on 09/09/23.    Expected Outcomes Christopher Lara will continue to participate in cardiac rehab for exercise, nutrtion and lifestyle modifications          ITP Comments:  ITP Comments     Row Name 06/20/23 1059 07/12/23 1325 08/08/23 0821 09/03/23 1126     ITP Comments Dr. Wilbert Bihari medical director. Introduction to pritikin education/intensive cardiac rehab. Initial orientation packet reviewed with patient. 30 Day ITP Review. Christopher Lara has has good attendance and participation with exercise at cardiac rehab. 30 Day ITP Review. Christopher Lara continues to have good attendance and participation with exercise at cardiac rehab. 30 Day ITP Review. Christopher Lara continues to have good attendance and participation with exercise at cardiac rehab. Christopher Lara will tenatively complete cardiac rehab on 09/09/23.       Comments: See ITP comments.Hadassah Elpidio Quan RN BSN

## 2023-09-11 ENCOUNTER — Encounter (HOSPITAL_COMMUNITY)
Admission: RE | Admit: 2023-09-11 | Discharge: 2023-09-11 | Disposition: A | Discharge status: Home / Self Care | Source: Ambulatory Visit | Attending: Cardiology

## 2023-09-11 DIAGNOSIS — I214 Non-ST elevation (NSTEMI) myocardial infarction: Secondary | ICD-10-CM

## 2023-09-11 DIAGNOSIS — Z955 Presence of coronary angioplasty implant and graft: Secondary | ICD-10-CM | POA: Diagnosis not present

## 2023-09-13 ENCOUNTER — Encounter (HOSPITAL_COMMUNITY)
Admission: RE | Admit: 2023-09-13 | Discharge: 2023-09-13 | Disposition: A | Discharge status: Home / Self Care | Source: Ambulatory Visit | Attending: Cardiology | Admitting: Cardiology

## 2023-09-13 VITALS — Ht 68.0 in | Wt 155.9 lb

## 2023-09-13 DIAGNOSIS — I214 Non-ST elevation (NSTEMI) myocardial infarction: Secondary | ICD-10-CM | POA: Diagnosis not present

## 2023-09-13 DIAGNOSIS — Z955 Presence of coronary angioplasty implant and graft: Secondary | ICD-10-CM | POA: Diagnosis not present

## 2023-09-16 ENCOUNTER — Encounter (HOSPITAL_COMMUNITY)
Admission: RE | Admit: 2023-09-16 | Discharge: 2023-09-16 | Disposition: A | Discharge status: Home / Self Care | Source: Ambulatory Visit | Attending: Cardiology

## 2023-09-16 DIAGNOSIS — Z955 Presence of coronary angioplasty implant and graft: Secondary | ICD-10-CM

## 2023-09-16 DIAGNOSIS — I214 Non-ST elevation (NSTEMI) myocardial infarction: Secondary | ICD-10-CM

## 2023-09-17 ENCOUNTER — Encounter: Payer: Self-pay | Admitting: Emergency Medicine

## 2023-09-17 ENCOUNTER — Ambulatory Visit: Attending: Emergency Medicine | Admitting: Emergency Medicine

## 2023-09-17 VITALS — BP 110/62 | HR 73 | Ht 69.0 in | Wt 156.0 lb

## 2023-09-17 DIAGNOSIS — I214 Non-ST elevation (NSTEMI) myocardial infarction: Secondary | ICD-10-CM | POA: Diagnosis not present

## 2023-09-17 DIAGNOSIS — I25110 Atherosclerotic heart disease of native coronary artery with unstable angina pectoris: Secondary | ICD-10-CM | POA: Diagnosis not present

## 2023-09-17 DIAGNOSIS — R911 Solitary pulmonary nodule: Secondary | ICD-10-CM

## 2023-09-17 DIAGNOSIS — Z9889 Other specified postprocedural states: Secondary | ICD-10-CM

## 2023-09-17 DIAGNOSIS — E782 Mixed hyperlipidemia: Secondary | ICD-10-CM

## 2023-09-17 NOTE — Progress Notes (Signed)
 Cardiology Office Note:    Date:  09/17/2023  ID:  Christopher Lara, DOB September 30, 1975, MRN [id number] PCP: Tanda Bleacher, MD  [facility] Providers Cardiologist:  Peter Swaziland, MD Cardiology APP:  Rana Lum CROME, Christopher Lara       Patient Profile:       Chief Complaint: 37-month follow-up History of Present Illness:  Christopher Lara is a 48 y.o. male with visit-pertinent history of previous severe hyponatremia, COVID-pneumonia in 02/2019, large pericardial effusion in 03/2022 requiring pericardiocentesis then later pericardial window 05/2022, kidney stones, panhypopituitarism, hypothyroidism, hyperparathyroidism, hypercalcemia   He has history of severe hyponatremia in the setting of COVID-pneumonia in January 2021.  He was seen in the ED in December 2022 for syncopal episode that occurred after he took a hot shower.  He developed tunnel vision and sensation of things 1.before he passed out and hit his face in the fall.  He was admitted in February 2024 with severe hyponatremia after presenting with altered mental status.  He was found to have large pericardial effusion for which cardiology service was consulted.  Serial troponins were negative.  Sodium markedly low at 105.  CK significantly elevated to 15,000 consistent with rhabdomyolysis.  BNP was normal.  He was treated with hypertonic saline.  Echocardiogram at that time showed LVEF 70 to 75%, grade 2 diastolic dysfunction, large pericardial effusion measuring up to 3.85 cm with mild RV diastolic collapse and the respiratory flow variation across the tricuspid valve.  Findings were not consistent with cardiac tamponade.  Sodium had improved to 120.  Patient did undergo pericardiocentesis with removal of 1 L of fluid on 04/19/2022.  Initial analysis consistent with transudative effusion.  Limited echocardiogram obtained on 04/19/2022 demonstrated EF of 60-70%, trivial pericardial effusion.   On follow-up limited echocardiogram obtained 05/23/2022  showed LVEF 60 to 65%, large pericardial effusion.  He was seen by [name], Christopher Lara on 06/05/2022 and was referred to CT surgery.  Unfortunately prior to him seeing CT surgery he was admitted to the hospital for 1124 chest pain.  Urgent bedside echocardiogram showed LVEF 60 to 65%, normal RV size and function, large pericardial effusion that is circumferential, no evidence of RV diastolic collapse.  ESR and CRP were elevated.  He was placed on colchicine , he initially had diarrhea and AKI, this improved with Imodium .  Patient was seen by Dr. Shyrl of CT surgery and eventually underwent robotic assisted pericardial window on 06/11/2022.  Chest tube was eventually removed on 06/14/2022.  Subsequent chest x-ray was unremarkable.  It was recommended he continue colchicine  for total of 3 months.   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 He was treated with hydrocortisone . 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    He was admitted to the hospital for 06/04/2023.  He presented to the ED for persistent left arm pain.  He did not have any chest pain, shortness of breath, nausea, no vomiting.  His initial troponin while in the ED was 13,045 and repeat 11,941.  CK 503. EKG shows NSR 81bpm, with inferior Q waves and ST-TW changes inferiorly, with ST depression/TWI changes I, avL, V4-V6. CXR showed scattered patchy peripheral upper lung predominant opacities, which may represent pulmonary nodules.  He underwent stat limited echo to rule out recurrent effusion showing LVEF 70 to 70%, no wall motion abnormalities, normal RV, no pericardial effusion.  He then underwent cardiac catheterization which showed occlusion of the  mid to distal RCA treated with overlapping DES x 2.  There was residual 60% proximal LAD, 80% second diagonal, 50% second OM to be treated medically.  Recommendations for DAPT with aspirin /Effient  for at  least 1 year.  Unable to use statin therapy with chronically elevated CK.  Toprol -XL 25 mg was attempted however patient was unable to tolerate therefore Toprol -XL was discontinued at discharge  He was last seen 06/17/2023.  He was doing well overall without acute cardiovascular concerns or complaints.  Medication management was continued and he was referred to Pharm.D. for PCSK9i.   Discussed the use of AI scribe software for clinical note transcription with the patient, who gave verbal consent to proceed.  History of Present Illness Christopher Lara is a 48 year old male with coronary artery disease who presents for 69-month follow-up.  Today he is without acute cardiovascular concerns or complaints.  He denies any anginal symptoms today.  He completed five weeks of cardiac rehabilitation following stent placement, attending workout classes and instructional sessions. He reduced attendance due to work commitments but experienced no issues during sessions. He reports no chest pain or shortness of breath since completing the program.  He has been on Repatha  for one month, administered biweekly, due to intolerance to statins. He has taken two doses without side effects. His LDL cholesterol was previously elevated at 117 mg/dL before starting Repatha . He is not on any cholesterol-lowering pills and was previously on losartan , which was discontinued as his blood pressure has been well-controlled.  He follows a diet including [place] burgers, oatmeal, and fruit, and has attended diet education and cooking classes. He has lost weight since his last visit, though the exact amount is unknown.   He denies any dyspnea, orthopnea, PND, leg swelling, syncope, presyncope, lightheadedness, dizziness, palpitations.   Review of systems:  Please see the history of present illness. All other systems are reviewed and otherwise negative.      Studies Reviewed:        Echocardiogram 06/04/2023 1. Left  ventricular ejection fraction, by estimation, is 70 to 75%. The  left ventricle has hyperdynamic function. The left ventricle has no  regional wall motion abnormalities. Left ventricular diastolic parameters  were normal.   2. Right ventricular systolic function is normal. The right ventricular  size is normal.   3. The mitral valve is normal in structure. No evidence of mitral valve  regurgitation. No evidence of mitral stenosis.   4. The aortic valve is tricuspid. Aortic valve regurgitation is not  visualized. No aortic stenosis is present.   5. The inferior vena cava is normal in size with <50% respiratory  variability, suggesting right atrial pressure of 8 mmHg.    Cardiac catheterization for 06/04/2023 Culprit vessel - occlusion of mid- distal RCA Moderate nonobstructive CAD in proximal LAD (RFR 0.93) and second OM. Small vessel disease in second diagonal Normal LVEDP Successful PCI of the mid to distal RCA with DES x 2 overlapping Diagnostic Dominance: Right  Intervention     Risk Assessment/Calculations:              Physical Exam:   VS:  BP 110/62 (BP Location: Left Arm, Patient Position: Sitting, Cuff Size: Normal)   Pulse 73   Ht 5' 9 (1.753 m)   Wt 156 lb (70.8 kg)   BMI 23.04 kg/m    Wt Readings from Last 3 Encounters:  09/17/23 156 lb (70.8 kg)  09/13/23 155 lb 13.8 oz (70.7 kg)  06/20/23 167 lb 12.3 oz (76.1 kg)    GEN: Well nourished, well developed in no acute distress NECK: No JVD; No carotid bruits CARDIAC: RRR, no murmurs, rubs, gallops RESPIRATORY:  Clear to auscultation without rales, wheezing or rhonchi  ABDOMEN: Soft, non-tender, non-distended EXTREMITIES:  No edema; No acute deformity      Assessment and Plan:  Coronary artery disease / NSTEMI Cardiac catheterization on 06/04/2023 which showed occlusion of mid to distal RCA treated with overlapping DES x 2. Residual 60% proximal LAD, 80% second diagonal, 50% second OM to be treated  medically Echocardiogram 05/2023 with LVEF of 70 to 75%, no RWMA - Today he remains without chest pains.  He denies anginal symptoms, no indication for further ischemic evaluation at this time - Has completed cardiac rehab without exertional symptoms - Not on beta-blocking therapy due to low normal blood pressure - Continue DAPT x 1 year with Aspirin  81 mg and Effient  10 mg daily - Continue Repatha  and nitroglycerin  as needed - Repeat lipid panel x 6 weeks   Panhypopituitarism Hypothyroidism  Hyperparathyroidism  Hypercalcemia  - On hydrocortisone  5mg  - takes 2 tablets in the morning and 1 between 4 pm and 6 pm - On levothyroxine  100mg  daily, vit D3 50,000 weekly on Sundays - Managed by endocrinology at [facility]   Hyperlipidemia, LDL goal <55 LDL 117, HDL 35, TG 125, TC 177 on 5/0/7974 Lipoprotein ( a) 72.3 on 06/05/2023 Was not a candidate for statins due to chronically elevated CK Has been on Repatha  now x 1 month without any side effects - Repeat lipid panel x 6 weeks   Pulmonary nodules Chest x-ray on admission showed scattered patchy peripheral upper lung predominant opacities, which may represent pulmonary nodules - CT chest ordered and pending   H/o large pericardial effusion s/p pericardial window Echocardiogram 06/04/2023 showed no evidence of pericardial effusion - Suspected related to postviral/COVID - He remains asymptomatic, no indication for intervention at this time.      Dispo:  Return in about 6 months (around 03/19/2024).  Signed, Lum LITTIE Louis, Christopher Lara

## 2023-09-17 NOTE — Patient Instructions (Addendum)
 Medication Instructions:  NO CHANGES   Lab Work: FASTING LIPID PANEL IN 6 WEEKS.  Testing/Procedures: TO BE SCHEDULED TODAY AND TO BE DONE WITHIN A FEW WEEKS.  Non-Cardiac CT scanning, (CAT scanning), is a noninvasive, special x-ray that produces cross-sectional images of the body using x-rays and a computer. CT scans help physicians diagnose and treat medical conditions. For some CT exams, a contrast material is used to enhance visibility in the area of the body being studied. CT scans provide greater clarity and reveal more details than regular x-ray exams.   Follow-Up: At [facility], you and your health needs are our priority.  As part of our continuing mission to provide you with exceptional heart care, our providers are all part of one team.  This team includes your primary Cardiologist (physician) and Advanced Practice Providers or APPs (Physician Assistants and Nurse Practitioners) who all work together to provide you with the care you need, when you need it.  Your next appointment:   6 MONTHS  Provider:   Peter Swaziland, MD

## 2023-09-27 NOTE — Progress Notes (Signed)
 Discharge Progress Report  Patient Details  Name: Christopher Lara MRN: [id number] Date of Birth: 07/01/1975 Referring Provider:   Flowsheet Row INTENSIVE CARDIAC REHAB ORIENT from 06/20/2023 in [facility]  Referring Provider Dr. Swazil[place] Peter     Number of Visits: 52  Reason for Discharge:  Patient reached a stable level of exercise. Patient independent in their exercise. Patient has met program and personal goals.  Smoking History:  Social History   Tobacco Use  Smoking Status Never   Passive exposure: Never  Smokeless Tobacco Never    Diagnosis:  06/04/23 NSTEMI (non-ST elevated myocardial infarction) (HCC)  06/04/23 Status post coronary artery stent placement  ADL UCSD:   Initial Exercise Prescription:  Initial Exercise Prescription - 06/20/23 1300       Date of Initial Exercise RX and Referring Provider   Date 06/20/23    Referring Provider Dr. [place] Peter    Expected Discharge Date 09/11/23      Bike   Level 2    Watts 26    Minutes 15    METs 2.4      Elliptical   Level 1    Speed 1    Minutes 15    METs 3.4      Prescription Details   Frequency (times per week) 3 days/week    Duration Progress to 30 minutes of continuous aerobic without signs/symptoms of physical distress      Intensity   THRR 40-80% of Max Heartrate 68-137    Ratings of Perceived Exertion 11-13      Progression   Progression Continue to progress workloads to maintain intensity without signs/symptoms of physical distress.      Resistance Training   Training Prescription Yes    Weight 4    Reps 10-15          Discharge Exercise Prescription (Final Exercise Prescription Changes):  Exercise Prescription Changes - 09/16/23 0837       Response to Exercise   Blood Pressure (Admit) 130/80    Blood Pressure (Exit) 138/80    Heart Rate (Admit) 85 bpm    Heart Rate (Exercise) 137 bpm    Heart Rate (Exit) 94 bpm     Rating of Perceived Exertion (Exercise) 9    Perceived Dyspnea (Exercise) 0    Symptoms 0    Comments Pt graduated teh Pritikin ICR program    Duration Progress to 30 minutes of  aerobic without signs/symptoms of physical distress    Intensity THRR unchanged      Progression   Progression Continue to progress workloads to maintain intensity without signs/symptoms of physical distress.    Average METs 7.45      Resistance Training   Training Prescription Yes    Weight 6    Reps 10-15    Time 10 Minutes      Bike   Level 4.5    Watts 75    Minutes 65    METs 8      Elliptical   Level 4    Speed 3    Minutes 15    METs 7.6      Home Exercise Plan   Plans to continue exercise at Home (comment)    Frequency Add 2 additional days to program exercise sessions.    Initial Home Exercises Provided 07/10/23          Functional Capacity:  6 Minute Walk  Row Name 06/20/23 1320 09/13/23 0833       6 Minute Walk   Phase Initial Discharge    Distance 1440 feet 1845 feet    Distance % Change -- 28.13 %    Distance Feet Change -- 405 ft    Walk Time 6 minutes 6 minutes    # of Rest Breaks 0 0    MPH 2.72 3.49    METS 4.5 5.33    RPE 8 8    Perceived Dyspnea  0 0    VO2 Peak 15.83 18.65    Symptoms No No    Resting HR 70 bpm 94 bpm    Resting BP 112/68 104/60    Resting Oxygen Saturation  100 % --    Max Ex. HR 118 bpm 110 bpm    Max Ex. BP 122/70 130/68    2 Minute Post BP 120/76 --       Psychological, QOL, Others - Outcomes: PHQ 2/9:    09/13/2023    8:19 AM 06/20/2023    2:31 PM 05/16/2022    2:16 PM 04/03/2021    3:09 PM 03/26/2019    9:27 AM  Depression screen PHQ 2/9  Decreased Interest 0 1 0 0 0  Down, Depressed, Hopeless 0 0 0 0 0  PHQ - 2 Score 0 1 0 0 0  Altered sleeping 0 0  0   Tired, decreased energy 0 1  0   Change in appetite 0 0  0   Feeling bad or failure about yourself  0 0  0   Trouble concentrating 0 0  0   Moving slowly or  fidgety/restless 0 0  0   Suicidal thoughts 0 0  0   PHQ-9 Score 0 2  0   Difficult doing work/chores  Somewhat difficult       Quality of Life:  Quality of Life - 09/16/23 0846       Quality of Life   Select Quality of Life      Quality of Life Scores   Health/Function Post 28.75 %    Socioeconomic Post 29.17 %    Psych/Spiritual Post 26.79 %    Family Post 30 %    GLOBAL Post 28.55 %          Personal Goals: Goals established at orientation with interventions provided to work toward goal.  Personal Goals and Risk Factors at Admission - 06/20/23 1342       Core Components/Risk Factors/Patient Goals on Admission    Weight Management Yes;Weight Loss    Intervention Weight Management: Develop a combined nutrition and exercise program designed to reach desired caloric intake, while maintaining appropriate intake of nutrient and fiber, sodium and fats, and appropriate energy expenditure required for the weight goal.;Weight Management: Provide education and appropriate resources to help participant work on and attain dietary goals.    Expected Outcomes Short Term: Continue to assess and modify interventions until short term weight is achieved;Long Term: Adherence to nutrition and physical activity/exercise program aimed toward attainment of established weight goal;Weight Maintenance: Understanding of the daily nutrition guidelines, which includes 25-35% calories from fat, 7% or less cal from saturated fats, less than 200mg  cholesterol, less than 1.5gm of sodium, & 5 or more servings of fruits and vegetables daily;Weight Loss: Understanding of general recommendations for a balanced deficit meal plan, which promotes 1-2 lb weight loss per week and includes a negative energy balance of [id number] kcal/d;Understanding recommendations for meals to  include 15-35% energy as protein, 25-35% energy from fat, 35-60% energy from carbohydrates, less than 200mg  of dietary cholesterol, 20-35 gm of total  fiber daily;Understanding of distribution of calorie intake throughout the day with the consumption of 4-5 meals/snacks    Hypertension Yes    Intervention Provide education on lifestyle modifcations including regular physical activity/exercise, weight management, moderate sodium restriction and increased consumption of fresh fruit, vegetables, and low fat dairy, alcohol moderation, and smoking cessation.;Monitor prescription use compliance.    Expected Outcomes Long Term: Maintenance of blood pressure at goal levels.;Short Term: Continued assessment and intervention until BP is < 140/58mm HG in hypertensive participants. < 130/45mm HG in hypertensive participants with diabetes, heart failure or chronic kidney disease.    Lipids Yes    Intervention Provide education and support for participant on nutrition & aerobic/resistive exercise along with prescribed medications to achieve LDL 70mg , HDL >40mg .    Expected Outcomes Long Term: Cholesterol controlled with medications as prescribed, with individualized exercise RX and with personalized nutrition plan. Value goals: LDL < 70mg , HDL > 40 mg.;Short Term: Participant states understanding of desired cholesterol values and is compliant with medications prescribed. Participant is following exercise prescription and nutrition guidelines.           Personal Goals Discharge:  Goals and Risk Factor Review     Row Name 07/12/23 1326 08/08/23 9177 09/03/23 1130         Core Components/Risk Factors/Patient Goals Review   Personal Goals Review Weight Management/Obesity;Hypertension;Lipids Weight Management/Obesity;Hypertension;Lipids Weight Management/Obesity;Hypertension;Lipids     Review Christopher Lara is doing very well with exercise at cardiac rehab. Christopher Lara has increased his met levels and has lost 4.0 kg since starting the program. Christopher Lara continues to do  very well with exercise at cardiac rehab. Christopher Lara continues to  increase his met levels and has lost 5.1  kg since  starting the program. Christopher Lara continues to do  very well with exercise at cardiac rehab. Christopher Lara continues to  increase his met levels and has lost 6.7   kg since starting the program. Christopher Lara will complete cardiac rehab tenatively on 09/09/23.     Expected Outcomes Christopher Lara will continue to participate in cardiac rehab for exercise, nutrtion and lifestyle modifications Christopher Lara will continue to participate in cardiac rehab for exercise, nutrtion and lifestyle modifications Christopher Lara will continue to participate in cardiac rehab for exercise, nutrtion and lifestyle modifications        Exercise Goals and Review:  Exercise Goals     Row Name 06/20/23 1101             Exercise Goals   Increase Physical Activity Yes       Intervention Provide advice, education, support and counseling about physical activity/exercise needs.;Develop an individualized exercise prescription for aerobic and resistive training based on initial evaluation findings, risk stratification, comorbidities and participant's personal goals.       Expected Outcomes Short Term: Attend rehab on a regular basis to increase amount of physical activity.;Long Term: Exercising regularly at least 3-5 days a week.;Long Term: Add in home exercise to make exercise part of routine and to increase amount of physical activity.       Increase Strength and Stamina Yes       Intervention Provide advice, education, support and counseling about physical activity/exercise needs.;Develop an individualized exercise prescription for aerobic and resistive training based on initial evaluation findings, risk stratification, comorbidities and participant's personal goals.       Expected Outcomes Short Term: Increase workloads  from initial exercise prescription for resistance, speed, and METs.;Short Term: Perform resistance training exercises routinely during rehab and add in resistance training at home;Long Term: Improve cardiorespiratory fitness, muscular endurance and strength  as measured by increased METs and functional capacity ( )       Able to understand and use rate of perceived exertion (RPE) scale Yes       Intervention Provide education and explanation on how to use RPE scale       Expected Outcomes Short Term: Able to use RPE daily in rehab to express subjective intensity level;Long Term:  Able to use RPE to guide intensity level when exercising independently       Knowledge and understanding of Target Heart Rate Range (THRR) Yes       Intervention Provide education and explanation of THRR including how the numbers were predicted and where they are located for reference       Expected Outcomes Short Term: Able to state/look up THRR;Long Term: Able to use THRR to govern intensity when exercising independently;Short Term: Able to use daily as guideline for intensity in rehab       Understanding of Exercise Prescription Yes       Intervention Provide education, explanation, and written materials on patient's individual exercise prescription       Expected Outcomes Short Term: Able to explain program exercise prescription;Long Term: Able to explain home exercise prescription to exercise independently          Exercise Goals Re-Evaluation:  Exercise Goals Re-Evaluation     Row Name 06/24/23 0827 07/10/23 0835 08/07/23 0830 09/04/23 0830 09/16/23 0839     Exercise Goal Re-Evaluation   Exercise Goals Review Increase Physical Activity;Understanding of Exercise Prescription;Increase Strength and Stamina;Knowledge and understanding of Target Heart Rate Range (THRR);Able to understand and use rate of perceived exertion (RPE) scale Increase Physical Activity;Understanding of Exercise Prescription;Increase Strength and Stamina;Knowledge and understanding of Target Heart Rate Range (THRR);Able to understand and use rate of perceived exertion (RPE) scale Increase Physical Activity;Understanding of Exercise Prescription;Increase Strength and Stamina;Knowledge and  understanding of Target Heart Rate Range (THRR);Able to understand and use rate of perceived exertion (RPE) scale Increase Physical Activity;Understanding of Exercise Prescription;Increase Strength and Stamina;Knowledge and understanding of Target Heart Rate Range (THRR);Able to understand and use rate of perceived exertion (RPE) scale Increase Physical Activity;Understanding of Exercise Prescription;Increase Strength and Stamina;Knowledge and understanding of Target Heart Rate Range (THRR);Able to understand and use rate of perceived exertion (RPE) scale   Comments Pt first day in teh pritikin ICR program. Pt tolerated exercise well with an average MET level of 4.63. Pt is off to a good start, attempted to INC WL but over THRR, will work with pt to get intensities in THRR. Reviewed MET's, goals and home ExRx. Pt tolerated exercise well with an average MET level of 4.9. He is doing well and progressing MET's. He is already feeling an increase in strength and stamina and is increasing his nutrition knowledge. Another goal was to get into a regular exercise routine, he's been doing great here, vitals good so he is planning on adding some days in at home by biking, walking and using some light hand weights. He plans to add in 1-2 days for 30-45 mins a session Reviewed MET's and goals. Pt tolerated exercise well with an average MET level of 6.7. He is doing well and progressing MET's. Working together to [employer] as tolerated by [employer]. He has a fitness tracker at home now and  is doing more bike riding and some handweights at home. He's feeling good and is increasing strength Reviewed MET's and goals. Pt tolerated exercise well with an average MET level of 7.8. He is doing well and progressing MET's. He feels a big change and an increase in strength. He feels good about his goals and feels he has a good understanding of Nutrition and Exercise Pt graduated teh Pritikin ICR program. Pt tolerated exercise well with an  average MET level of 7.45. He enjoyed the time in the program and feels much better about his health, strength and stamina. He increased on his post by 405 ft for a total of 1859ft. He was also able to have sucessful weight loss and decrease in %BF. He will continue to exercise on his own by using hand weights, walking, biking, and going to the community gym 4-5 days for 45-60 mins   Expected Outcomes Will continue to monitor pt and progress workloads as tolerated without sign or symptom Will continue to monitor pt and progress workloads as tolerated without sign or symptom Will continue to monitor pt and progress workloads as tolerated without sign or symptom Will continue to monitor pt and progress workloads as tolerated without sign or symptom Will continue to monitor pt and progress workloads as tolerated without sign or symptom      Nutrition & Weight - Outcomes:  Pre Biometrics - 06/20/23 1100       Pre Biometrics   Waist Circumference 37 inches    Hip Circumference 39.5 inches    Waist to Hip Ratio 0.94 %    Triceps Skinfold 10 mm    % Body Fat 23.1 %    Grip Strength 32 kg    Flexibility 12.5 in    Single Leg Stand 15.68 seconds          Post Biometrics - 09/13/23 0835        Post  Biometrics   Height 5' 8 (1.727 m)    Weight 70.7 kg    Waist Circumference 34 inches    Hip Circumference 37 inches    Waist to Hip Ratio 0.92 %    BMI (Calculated) 23.7    Triceps Skinfold 9 mm    % Body Fat 20.6 %    Grip Strength 31 kg    Flexibility 13 in    Single Leg Stand 30 seconds          Nutrition:  Nutrition Therapy & Goals - 09/16/23 1111       Nutrition Therapy   Diet Heart healthy diet      Personal Nutrition Goals   Nutrition Goal Patient to identify strategies for reducing cardiovascular risk by attending the Pritikin education and nutrition series weekly.   Goal not met.   Personal Goal #2 Patient to improve diet quality by using the plate method as a  guide for meal planning to include lean protein/plant protein, fruits, vegetables, whole grains, nonfat dairy as part of a well-balanced diet.   goal in progress   Comments Goals in progress. Christopher Lara has medical history of NSTEMI, CAD, hyperlipidemia.  In addition, he has medical history of panhypopituitarism, hypothyroidism, hyperparathyroidism, hypercalcemia managed by [facility] endocrinology. He is unable to utilize statin therapy due to chronically elevated CK; referred to [facility] clinic and PCSK9i/Repatha  started on 08/20/23, LDL  goal <55 and lipids are not currently at goal. Lipid panel labs scheduled this week for updated labs. He has attended some of the Pritikin  education and nutrition series. He is down 10.5# since starting with our program. Patient will benefit from adherence to nutrition, exercise, and lifestyle modification.      Intervention Plan   Intervention Prescribe, educate and counsel regarding individualized specific dietary modifications aiming towards targeted core components such as weight, hypertension, lipid management, diabetes, heart failure and other comorbidities.;Nutrition handout(s) given to patient.    Expected Outcomes Short Term Goal: Understand basic principles of dietary content, such as calories, fat, sodium, cholesterol and nutrients.;Long Term Goal: Adherence to prescribed nutrition plan.          Nutrition Discharge:  Nutrition Assessments - 09/16/23 0847       Rate Your Plate Scores   Post Score 81          Education Questionnaire Score:  Knowledge Questionnaire Score - 09/16/23 0851       Knowledge Questionnaire Score   Post Score 22/24         Pt graduates from  Intensive/Traditional cardiac rehab program today with completion of 50 exercise and education sessions. Pt maintained good attendance and progressed nicely during their participation in rehab as evidenced by increased MET level. Pt increased his functional capacity by 486ft by the  end of cardiac rehab program. Medication list reconciled. Repeat  PHQ score 0.  Pt has made significant lifestyle changes and should be commended for their success.  Christopher Lara achieved his goals during cardiac rehab.   Pt plans to continue exercise on his own by using hand weights, walking, biking, and going to the community gym 4-5 days for 45-60 mins.  Goals reviewed with patient; copy given to patient.

## 2023-10-01 ENCOUNTER — Ambulatory Visit (HOSPITAL_COMMUNITY)
Admission: RE | Admit: 2023-10-01 | Discharge: 2023-10-01 | Disposition: A | Discharge status: Home / Self Care | Source: Ambulatory Visit | Attending: Emergency Medicine | Admitting: Emergency Medicine

## 2023-10-01 DIAGNOSIS — I25110 Atherosclerotic heart disease of native coronary artery with unstable angina pectoris: Secondary | ICD-10-CM | POA: Diagnosis not present

## 2023-10-01 DIAGNOSIS — I700 Atherosclerosis of aorta: Secondary | ICD-10-CM | POA: Diagnosis not present

## 2023-10-01 DIAGNOSIS — R911 Solitary pulmonary nodule: Secondary | ICD-10-CM | POA: Diagnosis not present

## 2023-10-01 DIAGNOSIS — R918 Other nonspecific abnormal finding of lung field: Secondary | ICD-10-CM | POA: Diagnosis not present

## 2023-10-10 DIAGNOSIS — E230 Hypopituitarism: Secondary | ICD-10-CM | POA: Diagnosis not present

## 2023-10-10 DIAGNOSIS — E212 Other hyperparathyroidism: Secondary | ICD-10-CM | POA: Diagnosis not present

## 2023-10-11 ENCOUNTER — Encounter: Payer: Self-pay | Admitting: Surgery

## 2023-10-11 ENCOUNTER — Telehealth: Payer: Self-pay

## 2023-10-11 NOTE — Telephone Encounter (Signed)
 [place],  You saw this patient on 09/17/2023. Per protocol we request that you comment on his cardiac risk to proceed with   Parathyroid  surgery on TBD  since it has been less than 2 months since evaluated in the office. Please send your comment to P CV Pre-Op Pool.  Thank you, Lamarr Satterfield DNP, ANP, AACC.

## 2023-10-11 NOTE — Telephone Encounter (Signed)
   Patient Name: Christopher Lara  DOB: 06/08/1975 MRN: [id number]  Primary Cardiologist: Peter Swaziland, MD  Chart reviewed as part of pre-operative protocol coverage. Given past medical history and time since last visit, based on ACC/AHA guidelines, Christopher Lara is at acceptable risk for the planned procedure without further cardiovascular testing.   He should continue uninterrupted Plavix and ASA for one year as he has had percutaneous intervention of the mid to distal right coronary artery with overlapping stents on 06/04/2023.   The patient was advised that if he develops new symptoms prior to surgery to contact our office to arrange for a follow-up visit, and he verbalized understanding.  I will route this recommendation to the requesting party via Epic fax function and remove from pre-op pool.  Please call with questions.  Lamarr Satterfield, NP 10/11/2023, 11:21 AM

## 2023-10-11 NOTE — Telephone Encounter (Signed)
   Pre-operative Risk Assessment    Patient Name: Christopher Lara  DOB: 05/12/1975 MRN: [id number]   Date of last office visit: 09/17/23 Date of next office visit: Not scheduled   Request for Surgical Clearance    Procedure:  Parathyroid  surgery  Date of Surgery:  Clearance TBD                                Surgeon:  Krystal Spinner, MD Surgeon's Group or Practice Name:  [facility] Phone number:  [phone] Fax number:  [phone]   Type of Clearance Requested:   - Medical  - Pharmacy:  Hold Aspirin      Type of Anesthesia:  General    Additional requests/questions:    [name]   10/11/2023, 8:02 AM

## 2023-10-15 ENCOUNTER — Other Ambulatory Visit: Payer: Self-pay | Admitting: Surgery

## 2023-10-15 ENCOUNTER — Ambulatory Visit: Payer: Self-pay | Admitting: Emergency Medicine

## 2023-10-15 DIAGNOSIS — E0590 Thyrotoxicosis, unspecified without thyrotoxic crisis or storm: Secondary | ICD-10-CM

## 2023-10-22 ENCOUNTER — Ambulatory Visit
Admission: RE | Admit: 2023-10-22 | Discharge: 2023-10-22 | Disposition: A | Discharge status: Home / Self Care | Source: Ambulatory Visit | Attending: Surgery | Admitting: Surgery

## 2023-10-22 DIAGNOSIS — E041 Nontoxic single thyroid nodule: Secondary | ICD-10-CM | POA: Diagnosis not present

## 2023-10-22 DIAGNOSIS — E0590 Thyrotoxicosis, unspecified without thyrotoxic crisis or storm: Secondary | ICD-10-CM

## 2023-10-30 DIAGNOSIS — I25110 Atherosclerotic heart disease of native coronary artery with unstable angina pectoris: Secondary | ICD-10-CM | POA: Diagnosis not present

## 2023-10-30 DIAGNOSIS — I214 Non-ST elevation (NSTEMI) myocardial infarction: Secondary | ICD-10-CM | POA: Diagnosis not present

## 2023-10-30 LAB — LIPID PANEL
Chol/HDL Ratio: 2.4 ratio (ref 0.0–5.0)
Cholesterol, Total: 78 mg/dL — ABNORMAL LOW (ref 100–199)
HDL: 33 mg/dL — ABNORMAL LOW (ref >39)
LDL Chol Calc (NIH): 25 mg/dL (ref 0–99)
Triglycerides: 109 mg/dL (ref 0–149)
VLDL Cholesterol Cal: 20 mg/dL (ref 5–40)

## 2023-10-31 ENCOUNTER — Ambulatory Visit: Payer: Self-pay | Admitting: Surgery

## 2023-10-31 ENCOUNTER — Ambulatory Visit: Payer: Self-pay | Admitting: Emergency Medicine

## 2023-10-31 NOTE — Progress Notes (Signed)
 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  Sestamibi raises the possibility of bilateral adenomas.  Cardiology states that patient must remain on uninterrupted Plavix and ASA for one year (until April 2026).  Will plan to postpone parathyroid  surgery until safe to hold Plavix and ASA.  Will arrange follow up  in office in March 2026 to discuss surgery, request clearance, and plan an OR date.  [place] - please arrange follow up appt in March 2026.  Krystal Spinner, MD [facility] A [facility] practice Office: [phone]

## 2023-11-01 NOTE — Telephone Encounter (Signed)
 Patient was returning call. Please advise ?

## 2023-11-05 ENCOUNTER — Telehealth: Payer: Self-pay | Admitting: Surgery

## 2023-11-05 NOTE — Telephone Encounter (Signed)
 Telephone call to the patient to discuss recent recommendations regarding management of his anticoagulation and surgery.  Patient had placement of 2 cardiac stents in April 2025.  Discussion with his cardiologist, Dr. Peter Swaziland, favored maintaining treatment with Plavix and aspirin  for 1 year prior to any discontinuation for surgery.  Patient is essentially asymptomatic at this point in time.  Patient does have hypercalcemia.  Nuclear medicine parathyroid  scan and ultrasound suggested a left inferior parathyroid  adenoma or possibly bilateral parathyroid  adenomas.  Patient will require minimally invasive outpatient surgery at some point in the future.  At this time, the patient is relatively asymptomatic and not having any significant complications.  Therefore we will plan to delay surgical intervention until he has been treated with Plavix and aspirin  for 1 year.  At that point we will again check with his cardiologist prior to scheduling outpatient surgery.  Hopefully at that time we can discontinue his antiplatelet therapy for 1 week, perform his surgical procedure, and then restart his antiplatelet therapy the day following surgery.  Patient is in agreement with this plan.  We will schedule follow-up in March 2026 and anticipate surgery later next spring.  Krystal Spinner, MD [facility] Surgery Office: [phone]

## 2023-11-06 DIAGNOSIS — E038 Other specified hypothyroidism: Secondary | ICD-10-CM | POA: Diagnosis not present

## 2023-11-06 DIAGNOSIS — E230 Hypopituitarism: Secondary | ICD-10-CM | POA: Diagnosis not present

## 2023-11-06 DIAGNOSIS — E2749 Other adrenocortical insufficiency: Secondary | ICD-10-CM | POA: Diagnosis not present

## 2023-11-06 DIAGNOSIS — E213 Hyperparathyroidism, unspecified: Secondary | ICD-10-CM | POA: Diagnosis not present

## 2023-12-10 ENCOUNTER — Other Ambulatory Visit (HOSPITAL_COMMUNITY): Payer: Self-pay

## 2023-12-11 ENCOUNTER — Other Ambulatory Visit (HOSPITAL_COMMUNITY): Payer: Self-pay

## 2024-01-30 DIAGNOSIS — E213 Hyperparathyroidism, unspecified: Secondary | ICD-10-CM | POA: Diagnosis not present

## 2024-01-30 DIAGNOSIS — E230 Hypopituitarism: Secondary | ICD-10-CM | POA: Diagnosis not present

## 2024-01-30 DIAGNOSIS — E038 Other specified hypothyroidism: Secondary | ICD-10-CM | POA: Diagnosis not present

## 2024-02-14 ENCOUNTER — Encounter: Payer: Self-pay | Admitting: Emergency Medicine

## 2024-04-16 ENCOUNTER — Ambulatory Visit: Admitting: Emergency Medicine
